# Patient Record
Sex: Female | Born: 1945 | Race: Black or African American | Hispanic: No | Smoking: Never smoker
Health system: Southern US, Community
[De-identification: ages and names within clinical notes are randomized; demographics above are authoritative.]

## PROBLEM LIST (undated history)

## (undated) DIAGNOSIS — C801 Malignant (primary) neoplasm, unspecified: Secondary | ICD-10-CM

## (undated) DIAGNOSIS — Z9889 Other specified postprocedural states: Secondary | ICD-10-CM

## (undated) DIAGNOSIS — B029 Zoster without complications: Secondary | ICD-10-CM

## (undated) DIAGNOSIS — R112 Nausea with vomiting, unspecified: Secondary | ICD-10-CM

## (undated) DIAGNOSIS — R569 Unspecified convulsions: Secondary | ICD-10-CM

## (undated) DIAGNOSIS — H35039 Hypertensive retinopathy, unspecified eye: Secondary | ICD-10-CM

## (undated) DIAGNOSIS — M1990 Unspecified osteoarthritis, unspecified site: Secondary | ICD-10-CM

## (undated) DIAGNOSIS — D649 Anemia, unspecified: Secondary | ICD-10-CM

## (undated) DIAGNOSIS — H269 Unspecified cataract: Secondary | ICD-10-CM

## (undated) DIAGNOSIS — I10 Essential (primary) hypertension: Secondary | ICD-10-CM

## (undated) DIAGNOSIS — I82409 Acute embolism and thrombosis of unspecified deep veins of unspecified lower extremity: Secondary | ICD-10-CM

## (undated) DIAGNOSIS — C189 Malignant neoplasm of colon, unspecified: Secondary | ICD-10-CM

## (undated) DIAGNOSIS — D689 Coagulation defect, unspecified: Secondary | ICD-10-CM

## (undated) DIAGNOSIS — G971 Other reaction to spinal and lumbar puncture: Secondary | ICD-10-CM

## (undated) HISTORY — PX: COLONOSCOPY: SHX5424

## (undated) HISTORY — PX: PORTACATH PLACEMENT: SHX2246

## (undated) HISTORY — PX: EYE SURGERY: SHX253

## (undated) HISTORY — DX: Hypertensive retinopathy, unspecified eye: H35.039

## (undated) HISTORY — PX: OTHER SURGICAL HISTORY: SHX169

## (undated) HISTORY — DX: Anemia, unspecified: D64.9

## (undated) HISTORY — DX: Unspecified convulsions: R56.9

## (undated) HISTORY — PX: COLON SURGERY: SHX602

## (undated) HISTORY — PX: SMALL INTESTINE SURGERY: SHX150

## (undated) HISTORY — DX: Unspecified osteoarthritis, unspecified site: M19.90

## (undated) HISTORY — DX: Malignant neoplasm of colon, unspecified: C18.9

## (undated) HISTORY — DX: Acute embolism and thrombosis of unspecified deep veins of unspecified lower extremity: I82.409

## (undated) HISTORY — PX: RADIOFREQUENCY ABLATION LIVER TUMOR: SHX2293

## (undated) HISTORY — DX: Coagulation defect, unspecified: D68.9

## (undated) HISTORY — PX: CATARACT EXTRACTION: SUR2

## (undated) HISTORY — DX: Unspecified cataract: H26.9

---

## 1997-08-08 ENCOUNTER — Encounter: Admission: RE | Admit: 1997-08-08 | Discharge: 1997-11-06 | Payer: Self-pay | Admitting: Pulmonary Disease

## 1997-11-29 ENCOUNTER — Encounter: Admission: RE | Admit: 1997-11-29 | Discharge: 1998-02-27 | Payer: Self-pay | Admitting: Pulmonary Disease

## 1999-10-24 ENCOUNTER — Emergency Department (HOSPITAL_COMMUNITY): Admission: EM | Admit: 1999-10-24 | Discharge: 1999-10-24 | Payer: Self-pay | Admitting: Emergency Medicine

## 1999-10-29 ENCOUNTER — Ambulatory Visit (HOSPITAL_COMMUNITY): Admission: RE | Admit: 1999-10-29 | Discharge: 1999-10-29 | Payer: Self-pay | Admitting: Internal Medicine

## 1999-10-29 ENCOUNTER — Encounter: Payer: Self-pay | Admitting: Internal Medicine

## 1999-11-06 ENCOUNTER — Encounter: Payer: Self-pay | Admitting: Internal Medicine

## 1999-11-06 ENCOUNTER — Ambulatory Visit (HOSPITAL_COMMUNITY): Admission: RE | Admit: 1999-11-06 | Discharge: 1999-11-06 | Payer: Self-pay | Admitting: Internal Medicine

## 1999-11-09 ENCOUNTER — Encounter: Payer: Self-pay | Admitting: Internal Medicine

## 1999-11-09 ENCOUNTER — Ambulatory Visit (HOSPITAL_COMMUNITY): Admission: RE | Admit: 1999-11-09 | Discharge: 1999-11-09 | Payer: Self-pay | Admitting: Internal Medicine

## 2002-04-06 ENCOUNTER — Ambulatory Visit (HOSPITAL_COMMUNITY): Admission: RE | Admit: 2002-04-06 | Discharge: 2002-04-06 | Payer: Self-pay | Admitting: Pulmonary Disease

## 2002-04-06 ENCOUNTER — Encounter: Payer: Self-pay | Admitting: Pulmonary Disease

## 2005-07-19 ENCOUNTER — Emergency Department (HOSPITAL_COMMUNITY): Admission: EM | Admit: 2005-07-19 | Discharge: 2005-07-19 | Payer: Self-pay | Admitting: Emergency Medicine

## 2009-02-28 ENCOUNTER — Encounter: Admission: RE | Admit: 2009-02-28 | Discharge: 2009-02-28 | Payer: Self-pay | Admitting: Gastroenterology

## 2009-03-17 ENCOUNTER — Ambulatory Visit (HOSPITAL_COMMUNITY): Admission: RE | Admit: 2009-03-17 | Discharge: 2009-03-17 | Payer: Self-pay | Admitting: General Surgery

## 2009-05-27 ENCOUNTER — Ambulatory Visit: Payer: Self-pay | Admitting: Hematology and Oncology

## 2009-05-30 LAB — CBC WITH DIFFERENTIAL/PLATELET
BASO%: 0.7 % (ref 0.0–2.0)
Eosinophils Absolute: 0 10*3/uL (ref 0.0–0.5)
MCHC: 32.6 g/dL (ref 31.5–36.0)
MONO#: 0.2 10*3/uL (ref 0.1–0.9)
NEUT#: 1.2 10*3/uL — ABNORMAL LOW (ref 1.5–6.5)
Platelets: 181 10*3/uL (ref 145–400)
RBC: 3.94 10*6/uL (ref 3.70–5.45)
RDW: 14.4 % (ref 11.2–14.5)
WBC: 2 10*3/uL — ABNORMAL LOW (ref 3.9–10.3)
lymph#: 0.5 10*3/uL — ABNORMAL LOW (ref 0.9–3.3)

## 2009-05-30 LAB — COMPREHENSIVE METABOLIC PANEL
ALT: 13 U/L (ref 0–35)
Albumin: 3.7 g/dL (ref 3.5–5.2)
CO2: 27 mEq/L (ref 19–32)
Glucose, Bld: 105 mg/dL — ABNORMAL HIGH (ref 70–99)
Potassium: 3.5 mEq/L (ref 3.5–5.3)
Sodium: 141 mEq/L (ref 135–145)
Total Bilirubin: 0.3 mg/dL (ref 0.3–1.2)
Total Protein: 7 g/dL (ref 6.0–8.3)

## 2009-05-30 LAB — CEA: CEA: <0.5 ng/mL (ref 0.0–5.0)

## 2009-06-06 ENCOUNTER — Ambulatory Visit (HOSPITAL_COMMUNITY): Admission: RE | Admit: 2009-06-06 | Discharge: 2009-06-06 | Payer: Self-pay | Admitting: Hematology and Oncology

## 2009-06-09 ENCOUNTER — Ambulatory Visit (HOSPITAL_COMMUNITY): Admission: RE | Admit: 2009-06-09 | Discharge: 2009-06-09 | Payer: Self-pay | Admitting: Hematology and Oncology

## 2009-06-10 LAB — COMPREHENSIVE METABOLIC PANEL
ALT: 12 U/L (ref 0–35)
AST: 17 U/L (ref 0–37)
Chloride: 106 mEq/L (ref 96–112)
Creatinine, Ser: 0.54 mg/dL (ref 0.40–1.20)
Total Bilirubin: 0.3 mg/dL (ref 0.3–1.2)

## 2009-06-10 LAB — CBC WITH DIFFERENTIAL/PLATELET
Basophils Absolute: 0 10*3/uL (ref 0.0–0.1)
EOS%: 0.5 % (ref 0.0–7.0)
HCT: 32.1 % — ABNORMAL LOW (ref 34.8–46.6)
HGB: 10.7 g/dL — ABNORMAL LOW (ref 11.6–15.9)
LYMPH%: 20.5 % (ref 14.0–49.7)
MCH: 29.6 pg (ref 25.1–34.0)
MCV: 88.9 fL (ref 79.5–101.0)
MONO%: 11.5 % (ref 0.0–14.0)
NEUT%: 67 % (ref 38.4–76.8)
Platelets: 215 10*3/uL (ref 145–400)
lymph#: 0.6 10*3/uL — ABNORMAL LOW (ref 0.9–3.3)

## 2009-06-11 LAB — CBC WITH DIFFERENTIAL/PLATELET
Basophils Absolute: 0 10*3/uL (ref 0.0–0.1)
EOS%: 0.6 % (ref 0.0–7.0)
HCT: 32.6 % — ABNORMAL LOW (ref 34.8–46.6)
HGB: 10.5 g/dL — ABNORMAL LOW (ref 11.6–15.9)
MCH: 29.2 pg (ref 25.1–34.0)
MCV: 90.8 fL (ref 79.5–101.0)
MONO%: 11.7 % (ref 0.0–14.0)
NEUT%: 73.2 % (ref 38.4–76.8)
Platelets: 211 10*3/uL (ref 145–400)

## 2009-06-11 LAB — COMPREHENSIVE METABOLIC PANEL
Albumin: 3.3 g/dL — ABNORMAL LOW (ref 3.5–5.2)
Alkaline Phosphatase: 73 U/L (ref 39–117)
BUN: 10 mg/dL (ref 6–23)
Glucose, Bld: 92 mg/dL (ref 70–99)
Total Bilirubin: 0.3 mg/dL (ref 0.3–1.2)

## 2009-06-16 ENCOUNTER — Emergency Department (HOSPITAL_COMMUNITY): Admission: EM | Admit: 2009-06-16 | Discharge: 2009-06-16 | Payer: Self-pay | Admitting: Family Medicine

## 2009-06-16 ENCOUNTER — Emergency Department (HOSPITAL_COMMUNITY): Admission: EM | Admit: 2009-06-16 | Discharge: 2009-06-16 | Payer: Self-pay | Admitting: Emergency Medicine

## 2009-06-20 ENCOUNTER — Ambulatory Visit: Payer: Self-pay | Admitting: Hematology and Oncology

## 2009-06-24 LAB — COMPREHENSIVE METABOLIC PANEL
Albumin: 3.2 g/dL — ABNORMAL LOW (ref 3.5–5.2)
Alkaline Phosphatase: 90 U/L (ref 39–117)
BUN: 11 mg/dL (ref 6–23)
CO2: 27 mEq/L (ref 19–32)
Calcium: 8.6 mg/dL (ref 8.4–10.5)
Glucose, Bld: 100 mg/dL — ABNORMAL HIGH (ref 70–99)
Potassium: 4 mEq/L (ref 3.5–5.3)
Total Protein: 5.7 g/dL — ABNORMAL LOW (ref 6.0–8.3)

## 2009-06-24 LAB — CBC WITH DIFFERENTIAL/PLATELET
Basophils Absolute: 0 10*3/uL (ref 0.0–0.1)
Eosinophils Absolute: 0.1 10*3/uL (ref 0.0–0.5)
HGB: 10.4 g/dL — ABNORMAL LOW (ref 11.6–15.9)
MCV: 89.1 fL (ref 79.5–101.0)
MONO#: 0.8 10*3/uL (ref 0.1–0.9)
MONO%: 9.6 % (ref 0.0–14.0)
NEUT#: 6.1 10*3/uL (ref 1.5–6.5)
Platelets: 134 10*3/uL — ABNORMAL LOW (ref 145–400)
RDW: 14.9 % — ABNORMAL HIGH (ref 11.2–14.5)
WBC: 7.9 10*3/uL (ref 3.9–10.3)

## 2009-06-25 LAB — UA PROTEIN, DIPSTICK - CHCC: Protein, Urine: NEGATIVE mg/dL

## 2009-07-08 LAB — COMPREHENSIVE METABOLIC PANEL
ALT: 15 U/L (ref 0–35)
Albumin: 3.1 g/dL — ABNORMAL LOW (ref 3.5–5.2)
CO2: 26 mEq/L (ref 19–32)
Calcium: 7.7 mg/dL — ABNORMAL LOW (ref 8.4–10.5)
Chloride: 107 mEq/L (ref 96–112)
Glucose, Bld: 105 mg/dL — ABNORMAL HIGH (ref 70–99)
Sodium: 142 mEq/L (ref 135–145)
Total Bilirubin: 0.3 mg/dL (ref 0.3–1.2)
Total Protein: 5.4 g/dL — ABNORMAL LOW (ref 6.0–8.3)

## 2009-07-08 LAB — CBC WITH DIFFERENTIAL/PLATELET
Basophils Absolute: 0.1 10*3/uL (ref 0.0–0.1)
Eosinophils Absolute: 0.1 10*3/uL (ref 0.0–0.5)
HGB: 10.5 g/dL — ABNORMAL LOW (ref 11.6–15.9)
MCV: 88 fL (ref 79.5–101.0)
MONO#: 0.9 10*3/uL (ref 0.1–0.9)
MONO%: 12.9 % (ref 0.0–14.0)
NEUT#: 4.9 10*3/uL (ref 1.5–6.5)
Platelets: 116 10*3/uL — ABNORMAL LOW (ref 145–400)
RDW: 16.5 % — ABNORMAL HIGH (ref 11.2–14.5)
WBC: 6.8 10*3/uL (ref 3.9–10.3)
nRBC: 1 % — ABNORMAL HIGH (ref 0–0)

## 2009-07-09 LAB — UA PROTEIN, DIPSTICK - CHCC: Protein, Urine: NEGATIVE mg/dL

## 2009-07-21 ENCOUNTER — Ambulatory Visit: Payer: Self-pay | Admitting: Hematology and Oncology

## 2009-07-22 LAB — COMPREHENSIVE METABOLIC PANEL
ALT: 16 U/L (ref 0–35)
AST: 18 U/L (ref 0–37)
CO2: 22 mEq/L (ref 19–32)
Calcium: 7.9 mg/dL — ABNORMAL LOW (ref 8.4–10.5)
Chloride: 108 mEq/L (ref 96–112)
Creatinine, Ser: 0.69 mg/dL (ref 0.40–1.20)
Sodium: 140 mEq/L (ref 135–145)
Total Protein: 5.1 g/dL — ABNORMAL LOW (ref 6.0–8.3)

## 2009-07-22 LAB — CBC WITH DIFFERENTIAL/PLATELET
BASO%: 0.2 % (ref 0.0–2.0)
EOS%: 1.3 % (ref 0.0–7.0)
MCH: 28.8 pg (ref 25.1–34.0)
MCHC: 32.8 g/dL (ref 31.5–36.0)
MONO#: 1 10*3/uL — ABNORMAL HIGH (ref 0.1–0.9)
NEUT%: 74.2 % (ref 38.4–76.8)
RBC: 3.35 10*6/uL — ABNORMAL LOW (ref 3.70–5.45)
RDW: 17.6 % — ABNORMAL HIGH (ref 11.2–14.5)
WBC: 7.2 10*3/uL (ref 3.9–10.3)
lymph#: 0.8 10*3/uL — ABNORMAL LOW (ref 0.9–3.3)

## 2009-07-23 LAB — UA PROTEIN, DIPSTICK - CHCC: Protein, Urine: NEGATIVE mg/dL

## 2009-07-31 LAB — COMPREHENSIVE METABOLIC PANEL
ALT: 13 U/L (ref 0–35)
AST: 14 U/L (ref 0–37)
Alkaline Phosphatase: 96 U/L (ref 39–117)
BUN: 12 mg/dL (ref 6–23)
Creatinine, Ser: 0.66 mg/dL (ref 0.40–1.20)

## 2009-07-31 LAB — CBC WITH DIFFERENTIAL/PLATELET
BASO%: 0.1 % (ref 0.0–2.0)
Basophils Absolute: 0 10*3/uL (ref 0.0–0.1)
EOS%: 2.1 % (ref 0.0–7.0)
HCT: 27.9 % — ABNORMAL LOW (ref 34.8–46.6)
HGB: 9.3 g/dL — ABNORMAL LOW (ref 11.6–15.9)
MCH: 28.9 pg (ref 25.1–34.0)
MCHC: 33.3 g/dL (ref 31.5–36.0)
MCV: 86.6 fL (ref 79.5–101.0)
MONO%: 6.5 % (ref 0.0–14.0)
NEUT%: 85.1 % — ABNORMAL HIGH (ref 38.4–76.8)
RDW: 17.7 % — ABNORMAL HIGH (ref 11.2–14.5)

## 2009-08-06 LAB — URINALYSIS, MICROSCOPIC - CHCC
Blood: NEGATIVE
Nitrite: NEGATIVE
Protein: NEGATIVE mg/dL
Specific Gravity, Urine: 1.01 (ref 1.003–1.035)
pH: 6.5 (ref 4.6–8.0)

## 2009-08-06 LAB — CBC WITH DIFFERENTIAL/PLATELET
Basophils Absolute: 0 10*3/uL (ref 0.0–0.1)
Eosinophils Absolute: 0.1 10*3/uL (ref 0.0–0.5)
HCT: 30.1 % — ABNORMAL LOW (ref 34.8–46.6)
HGB: 9.8 g/dL — ABNORMAL LOW (ref 11.6–15.9)
LYMPH%: 12.2 % — ABNORMAL LOW (ref 14.0–49.7)
MCHC: 32.4 g/dL (ref 31.5–36.0)
MONO#: 0.7 10*3/uL (ref 0.1–0.9)
NEUT#: 3.7 10*3/uL (ref 1.5–6.5)
NEUT%: 72.2 % (ref 38.4–76.8)
Platelets: 127 10*3/uL — ABNORMAL LOW (ref 145–400)
WBC: 5.1 10*3/uL (ref 3.9–10.3)

## 2009-08-19 LAB — CBC WITH DIFFERENTIAL/PLATELET
Basophils Absolute: 0.1 10*3/uL (ref 0.0–0.1)
EOS%: 0.5 % (ref 0.0–7.0)
Eosinophils Absolute: 0 10*3/uL (ref 0.0–0.5)
HCT: 30.7 % — ABNORMAL LOW (ref 34.8–46.6)
HGB: 9.6 g/dL — ABNORMAL LOW (ref 11.6–15.9)
MCH: 26.6 pg (ref 25.1–34.0)
MCV: 85 fL (ref 79.5–101.0)
MONO%: 15.2 % — ABNORMAL HIGH (ref 0.0–14.0)
NEUT#: 4 10*3/uL (ref 1.5–6.5)
NEUT%: 68.3 % (ref 38.4–76.8)
lymph#: 0.9 10*3/uL (ref 0.9–3.3)

## 2009-08-19 LAB — COMPREHENSIVE METABOLIC PANEL
AST: 23 U/L (ref 0–37)
Albumin: 3.4 g/dL — ABNORMAL LOW (ref 3.5–5.2)
BUN: 8 mg/dL (ref 6–23)
Calcium: 8.8 mg/dL (ref 8.4–10.5)
Chloride: 110 mEq/L (ref 96–112)
Creatinine, Ser: 0.69 mg/dL (ref 0.40–1.20)
Glucose, Bld: 83 mg/dL (ref 70–99)

## 2009-08-20 ENCOUNTER — Ambulatory Visit: Payer: Self-pay | Admitting: Hematology and Oncology

## 2009-08-27 ENCOUNTER — Ambulatory Visit (HOSPITAL_COMMUNITY): Admission: RE | Admit: 2009-08-27 | Discharge: 2009-08-27 | Payer: Self-pay | Admitting: Hematology and Oncology

## 2009-08-28 ENCOUNTER — Encounter: Payer: Self-pay | Admitting: Hematology and Oncology

## 2009-08-28 ENCOUNTER — Ambulatory Visit: Payer: Self-pay | Admitting: Vascular Surgery

## 2009-08-28 ENCOUNTER — Ambulatory Visit: Admission: RE | Admit: 2009-08-28 | Discharge: 2009-08-28 | Payer: Self-pay | Admitting: Hematology and Oncology

## 2009-08-28 LAB — CBC WITH DIFFERENTIAL/PLATELET
Basophils Absolute: 0 10*3/uL (ref 0.0–0.1)
EOS%: 0.8 % (ref 0.0–7.0)
Eosinophils Absolute: 0.1 10*3/uL (ref 0.0–0.5)
HCT: 26.2 % — ABNORMAL LOW (ref 34.8–46.6)
HGB: 8.6 g/dL — ABNORMAL LOW (ref 11.6–15.9)
MCH: 27.7 pg (ref 25.1–34.0)
MONO#: 0.8 10*3/uL (ref 0.1–0.9)
NEUT#: 8.8 10*3/uL — ABNORMAL HIGH (ref 1.5–6.5)
NEUT%: 85.1 % — ABNORMAL HIGH (ref 38.4–76.8)
RDW: 20.9 % — ABNORMAL HIGH (ref 11.2–14.5)
lymph#: 0.6 10*3/uL — ABNORMAL LOW (ref 0.9–3.3)

## 2009-09-02 LAB — COMPREHENSIVE METABOLIC PANEL
Albumin: 3.5 g/dL (ref 3.5–5.2)
Alkaline Phosphatase: 97 U/L (ref 39–117)
BUN: 10 mg/dL (ref 6–23)
CO2: 23 mEq/L (ref 19–32)
Glucose, Bld: 98 mg/dL (ref 70–99)
Total Bilirubin: 0.3 mg/dL (ref 0.3–1.2)

## 2009-09-02 LAB — CBC WITH DIFFERENTIAL/PLATELET
Basophils Absolute: 0.1 10*3/uL (ref 0.0–0.1)
Eosinophils Absolute: 0 10*3/uL (ref 0.0–0.5)
HCT: 29.8 % — ABNORMAL LOW (ref 34.8–46.6)
HGB: 9.2 g/dL — ABNORMAL LOW (ref 11.6–15.9)
LYMPH%: 9.3 % — ABNORMAL LOW (ref 14.0–49.7)
MCV: 85.4 fL (ref 79.5–101.0)
MONO%: 17.3 % — ABNORMAL HIGH (ref 0.0–14.0)
NEUT#: 5.2 10*3/uL (ref 1.5–6.5)
Platelets: 91 10*3/uL — ABNORMAL LOW (ref 145–400)

## 2009-09-02 LAB — PROTIME-INR

## 2009-09-03 LAB — HYPERCOAGULABLE PANEL, COMPREHENSIVE
Anticardiolipin IgA: 6 APL U/mL (ref <22)
Anticardiolipin IgG: 5 GPL U/mL (ref <23)
Anticardiolipin IgM: 6 MPL U/mL (ref <11)
Beta-2 Glyco I IgG: 3 G Units (ref <20)
DRVVT: 34.1 secs — ABNORMAL LOW (ref 36.2–44.3)
PTT Lupus Anticoagulant: 35.5 secs (ref 32.0–43.4)
Protein C Activity: 67 % — ABNORMAL LOW (ref 75–133)
Protein C, Total: 54 % — ABNORMAL LOW (ref 70–140)

## 2009-09-05 LAB — CBC WITH DIFFERENTIAL/PLATELET
Basophils Absolute: 0 10*3/uL (ref 0.0–0.1)
Eosinophils Absolute: 0.1 10*3/uL (ref 0.0–0.5)
HCT: 28.5 % — ABNORMAL LOW (ref 34.8–46.6)
LYMPH%: 9.5 % — ABNORMAL LOW (ref 14.0–49.7)
MONO#: 0.5 10*3/uL (ref 0.1–0.9)
NEUT#: 7.1 10*3/uL — ABNORMAL HIGH (ref 1.5–6.5)
NEUT%: 82.8 % — ABNORMAL HIGH (ref 38.4–76.8)
Platelets: 129 10*3/uL — ABNORMAL LOW (ref 145–400)
WBC: 8.5 10*3/uL (ref 3.9–10.3)

## 2009-09-09 LAB — PROTIME-INR
INR: 1.5 — ABNORMAL LOW (ref 2.00–3.50)
Protime: 18 Seconds — ABNORMAL HIGH (ref 10.6–13.4)

## 2009-09-16 LAB — CBC WITH DIFFERENTIAL/PLATELET
Eosinophils Absolute: 0 10*3/uL (ref 0.0–0.5)
MCV: 86.6 fL (ref 79.5–101.0)
MONO#: 0.4 10*3/uL (ref 0.1–0.9)
MONO%: 17 % — ABNORMAL HIGH (ref 0.0–14.0)
NEUT#: 1.1 10*3/uL — ABNORMAL LOW (ref 1.5–6.5)
RBC: 3.52 10*6/uL — ABNORMAL LOW (ref 3.70–5.45)
RDW: 22.1 % — ABNORMAL HIGH (ref 11.2–14.5)
WBC: 2.5 10*3/uL — ABNORMAL LOW (ref 3.9–10.3)
nRBC: 0 % (ref 0–0)

## 2009-09-16 LAB — COMPREHENSIVE METABOLIC PANEL
ALT: 14 U/L (ref 0–35)
Alkaline Phosphatase: 86 U/L (ref 39–117)
CO2: 24 mEq/L (ref 19–32)
Sodium: 144 mEq/L (ref 135–145)
Total Bilirubin: 0.3 mg/dL (ref 0.3–1.2)
Total Protein: 5.8 g/dL — ABNORMAL LOW (ref 6.0–8.3)

## 2009-09-16 LAB — PROTIME-INR: Protime: 14.4 Seconds — ABNORMAL HIGH (ref 10.6–13.4)

## 2009-09-17 LAB — UA PROTEIN, DIPSTICK - CHCC: Protein, Urine: NEGATIVE mg/dL

## 2009-09-19 ENCOUNTER — Ambulatory Visit: Payer: Self-pay | Admitting: Hematology and Oncology

## 2009-09-19 LAB — PROTIME-INR
INR: 1.4 — ABNORMAL LOW (ref 2.00–3.50)
Protime: 16.8 Seconds — ABNORMAL HIGH (ref 10.6–13.4)

## 2009-09-22 LAB — PROTIME-INR
INR: 1.1 — ABNORMAL LOW (ref 2.00–3.50)
Protime: 13.2 Seconds (ref 10.6–13.4)

## 2009-09-30 LAB — CBC WITH DIFFERENTIAL/PLATELET
BASO%: 0.6 % (ref 0.0–2.0)
LYMPH%: 25.7 % (ref 14.0–49.7)
MCHC: 31.8 g/dL (ref 31.5–36.0)
MONO#: 0.2 10*3/uL (ref 0.1–0.9)
Platelets: 113 10*3/uL — ABNORMAL LOW (ref 145–400)
RBC: 3.49 10*6/uL — ABNORMAL LOW (ref 3.70–5.45)
WBC: 2.1 10*3/uL — ABNORMAL LOW (ref 3.9–10.3)
lymph#: 0.5 10*3/uL — ABNORMAL LOW (ref 0.9–3.3)

## 2009-09-30 LAB — COMPREHENSIVE METABOLIC PANEL
Alkaline Phosphatase: 77 U/L (ref 39–117)
Glucose, Bld: 134 mg/dL — ABNORMAL HIGH (ref 70–99)
Sodium: 141 mEq/L (ref 135–145)
Total Bilirubin: 0.3 mg/dL (ref 0.3–1.2)
Total Protein: 6.1 g/dL (ref 6.0–8.3)

## 2009-09-30 LAB — PROTIME-INR: Protime: 15.6 Seconds — ABNORMAL HIGH (ref 10.6–13.4)

## 2009-10-06 ENCOUNTER — Emergency Department (HOSPITAL_COMMUNITY): Admission: EM | Admit: 2009-10-06 | Discharge: 2009-10-06 | Payer: Self-pay | Admitting: Emergency Medicine

## 2009-10-07 LAB — CBC WITH DIFFERENTIAL/PLATELET
Basophils Absolute: 0 10*3/uL (ref 0.0–0.1)
EOS%: 0.9 % (ref 0.0–7.0)
HGB: 9.4 g/dL — ABNORMAL LOW (ref 11.6–15.9)
MCH: 27.6 pg (ref 25.1–34.0)
MCV: 85.9 fL (ref 79.5–101.0)
MONO%: 2.5 % (ref 0.0–14.0)
RBC: 3.42 10*6/uL — ABNORMAL LOW (ref 3.70–5.45)
RDW: 22 % — ABNORMAL HIGH (ref 11.2–14.5)

## 2009-10-07 LAB — PROTIME-INR

## 2009-10-10 LAB — PROTIME-INR: INR: 1.6 — ABNORMAL LOW (ref 2.00–3.50)

## 2009-10-14 LAB — COMPREHENSIVE METABOLIC PANEL
BUN: 11 mg/dL (ref 6–23)
CO2: 26 mEq/L (ref 19–32)
Calcium: 8.7 mg/dL (ref 8.4–10.5)
Chloride: 105 mEq/L (ref 96–112)
Creatinine, Ser: 0.64 mg/dL (ref 0.40–1.20)
Glucose, Bld: 94 mg/dL (ref 70–99)

## 2009-10-14 LAB — CBC WITH DIFFERENTIAL/PLATELET
Basophils Absolute: 0 10*3/uL (ref 0.0–0.1)
EOS%: 1.1 % (ref 0.0–7.0)
Eosinophils Absolute: 0 10*3/uL (ref 0.0–0.5)
HCT: 30.9 % — ABNORMAL LOW (ref 34.8–46.6)
HGB: 9.6 g/dL — ABNORMAL LOW (ref 11.6–15.9)
MCH: 26.9 pg (ref 25.1–34.0)
MONO#: 0.5 10*3/uL (ref 0.1–0.9)
NEUT%: 65.2 % (ref 38.4–76.8)
lymph#: 0.7 10*3/uL — ABNORMAL LOW (ref 0.9–3.3)

## 2009-10-14 LAB — PROTIME-INR

## 2009-10-15 LAB — UA PROTEIN, DIPSTICK - CHCC: Protein, Urine: NEGATIVE mg/dL

## 2009-10-17 LAB — PROTIME-INR
INR: 2.7 (ref 2.00–3.50)
Protime: 32.4 Seconds — ABNORMAL HIGH (ref 10.6–13.4)

## 2009-10-20 ENCOUNTER — Ambulatory Visit: Payer: Self-pay | Admitting: Hematology and Oncology

## 2009-10-28 LAB — CBC WITH DIFFERENTIAL/PLATELET
BASO%: 0.3 % (ref 0.0–2.0)
Basophils Absolute: 0 10*3/uL (ref 0.0–0.1)
EOS%: 0.7 % (ref 0.0–7.0)
HCT: 31 % — ABNORMAL LOW (ref 34.8–46.6)
LYMPH%: 9.2 % — ABNORMAL LOW (ref 14.0–49.7)
MCH: 27.6 pg (ref 25.1–34.0)
MCHC: 32.2 g/dL (ref 31.5–36.0)
NEUT%: 85.1 % — ABNORMAL HIGH (ref 38.4–76.8)
Platelets: 114 10*3/uL — ABNORMAL LOW (ref 145–400)

## 2009-10-28 LAB — BASIC METABOLIC PANEL
CO2: 19 mEq/L (ref 19–32)
Calcium: 9 mg/dL (ref 8.4–10.5)
Chloride: 106 mEq/L (ref 96–112)
Glucose, Bld: 125 mg/dL — ABNORMAL HIGH (ref 70–99)
Sodium: 141 mEq/L (ref 135–145)

## 2009-11-03 LAB — PROTIME-INR: INR: 3.6 — ABNORMAL HIGH (ref 2.00–3.50)

## 2009-11-04 ENCOUNTER — Ambulatory Visit (HOSPITAL_COMMUNITY): Admission: RE | Admit: 2009-11-04 | Discharge: 2009-11-04 | Payer: Self-pay | Admitting: Pulmonary Disease

## 2009-11-07 LAB — PROTIME-INR
INR: 2.7 (ref 2.00–3.50)
Protime: 32.4 Seconds — ABNORMAL HIGH (ref 10.6–13.4)

## 2009-11-11 LAB — CBC WITH DIFFERENTIAL/PLATELET
BASO%: 0.7 % (ref 0.0–2.0)
Basophils Absolute: 0.1 10*3/uL (ref 0.0–0.1)
EOS%: 0.9 % (ref 0.0–7.0)
HGB: 9.8 g/dL — ABNORMAL LOW (ref 11.6–15.9)
MCH: 26.9 pg (ref 25.1–34.0)
MCHC: 31.1 g/dL — ABNORMAL LOW (ref 31.5–36.0)
RBC: 3.64 10*6/uL — ABNORMAL LOW (ref 3.70–5.45)
RDW: 21.5 % — ABNORMAL HIGH (ref 11.2–14.5)
lymph#: 1.1 10*3/uL (ref 0.9–3.3)
nRBC: 1 % — ABNORMAL HIGH (ref 0–0)

## 2009-11-11 LAB — COMPREHENSIVE METABOLIC PANEL
ALT: 22 U/L (ref 0–35)
AST: 27 U/L (ref 0–37)
Albumin: 3.6 g/dL (ref 3.5–5.2)
Alkaline Phosphatase: 118 U/L — ABNORMAL HIGH (ref 39–117)
Potassium: 3.8 mEq/L (ref 3.5–5.3)
Sodium: 143 mEq/L (ref 135–145)
Total Bilirubin: 0.3 mg/dL (ref 0.3–1.2)
Total Protein: 5.8 g/dL — ABNORMAL LOW (ref 6.0–8.3)

## 2009-11-11 LAB — UA PROTEIN, DIPSTICK - CHCC: Protein, Urine: NEGATIVE mg/dL

## 2009-11-19 ENCOUNTER — Ambulatory Visit: Payer: Self-pay | Admitting: Hematology and Oncology

## 2009-11-21 LAB — COMPREHENSIVE METABOLIC PANEL
ALT: 15 U/L (ref 0–35)
BUN: 11 mg/dL (ref 6–23)
CO2: 24 mEq/L (ref 19–32)
Calcium: 8 mg/dL — ABNORMAL LOW (ref 8.4–10.5)
Chloride: 110 mEq/L (ref 96–112)
Creatinine, Ser: 0.65 mg/dL (ref 0.40–1.20)
Total Bilirubin: 0.3 mg/dL (ref 0.3–1.2)

## 2009-11-21 LAB — CBC WITH DIFFERENTIAL/PLATELET
BASO%: 0.1 % (ref 0.0–2.0)
Basophils Absolute: 0 10*3/uL (ref 0.0–0.1)
HCT: 27 % — ABNORMAL LOW (ref 34.8–46.6)
HGB: 9 g/dL — ABNORMAL LOW (ref 11.6–15.9)
LYMPH%: 6.8 % — ABNORMAL LOW (ref 14.0–49.7)
MCH: 28.7 pg (ref 25.1–34.0)
MCHC: 33.2 g/dL (ref 31.5–36.0)
MONO#: 1.5 10*3/uL — ABNORMAL HIGH (ref 0.1–0.9)
NEUT%: 79.1 % — ABNORMAL HIGH (ref 38.4–76.8)
Platelets: 124 10*3/uL — ABNORMAL LOW (ref 145–400)
WBC: 11.7 10*3/uL — ABNORMAL HIGH (ref 3.9–10.3)
lymph#: 0.8 10*3/uL — ABNORMAL LOW (ref 0.9–3.3)

## 2009-11-26 ENCOUNTER — Emergency Department (HOSPITAL_COMMUNITY): Admission: EM | Admit: 2009-11-26 | Discharge: 2009-11-26 | Payer: Self-pay | Admitting: Emergency Medicine

## 2009-11-26 LAB — CBC WITH DIFFERENTIAL/PLATELET
Basophils Absolute: 0.1 10*3/uL (ref 0.0–0.1)
EOS%: 1.9 % (ref 0.0–7.0)
Eosinophils Absolute: 0.2 10*3/uL (ref 0.0–0.5)
HCT: 33 % — ABNORMAL LOW (ref 34.8–46.6)
HGB: 10.1 g/dL — ABNORMAL LOW (ref 11.6–15.9)
MONO#: 1 10*3/uL — ABNORMAL HIGH (ref 0.1–0.9)
NEUT#: 7.4 10*3/uL — ABNORMAL HIGH (ref 1.5–6.5)
NEUT%: 77.3 % — ABNORMAL HIGH (ref 38.4–76.8)
RDW: 21.7 % — ABNORMAL HIGH (ref 11.2–14.5)
WBC: 9.6 10*3/uL (ref 3.9–10.3)
lymph#: 0.9 10*3/uL (ref 0.9–3.3)

## 2009-11-26 LAB — PROTIME-INR
INR: 2.2 (ref 2.00–3.50)
Protime: 26.4 Seconds — ABNORMAL HIGH (ref 10.6–13.4)

## 2009-11-26 LAB — UA PROTEIN, DIPSTICK - CHCC: Protein, Urine: NEGATIVE mg/dL

## 2009-12-02 LAB — COMPREHENSIVE METABOLIC PANEL
ALT: 18 U/L (ref 0–35)
AST: 27 U/L (ref 0–37)
Alkaline Phosphatase: 101 U/L (ref 39–117)
BUN: 11 mg/dL (ref 6–23)
Creatinine, Ser: 0.7 mg/dL (ref 0.40–1.20)
Potassium: 3.6 mEq/L (ref 3.5–5.3)

## 2009-12-02 LAB — PROTIME-INR
INR: 2.1 (ref 2.00–3.50)
Protime: 25.2 Seconds — ABNORMAL HIGH (ref 10.6–13.4)

## 2009-12-02 LAB — CBC WITH DIFFERENTIAL/PLATELET
EOS%: 0.5 % (ref 0.0–7.0)
Eosinophils Absolute: 0 10*3/uL (ref 0.0–0.5)
HGB: 9.3 g/dL — ABNORMAL LOW (ref 11.6–15.9)
MCV: 86.6 fL (ref 79.5–101.0)
MONO%: 10.2 % (ref 0.0–14.0)
NEUT#: 3.3 10*3/uL (ref 1.5–6.5)
RBC: 3.4 10*6/uL — ABNORMAL LOW (ref 3.70–5.45)
RDW: 23.3 % — ABNORMAL HIGH (ref 11.2–14.5)
lymph#: 0.7 10*3/uL — ABNORMAL LOW (ref 0.9–3.3)

## 2009-12-16 LAB — CBC WITH DIFFERENTIAL/PLATELET
BASO%: 0.5 % (ref 0.0–2.0)
LYMPH%: 11.5 % — ABNORMAL LOW (ref 14.0–49.7)
MCHC: 32.3 g/dL (ref 31.5–36.0)
MCV: 87.1 fL (ref 79.5–101.0)
MONO%: 9.3 % (ref 0.0–14.0)
Platelets: 92 10*3/uL — ABNORMAL LOW (ref 145–400)
RBC: 3.42 10*6/uL — ABNORMAL LOW (ref 3.70–5.45)

## 2009-12-16 LAB — UA PROTEIN, DIPSTICK - CHCC: Protein, Urine: NEGATIVE mg/dL

## 2009-12-16 LAB — COMPREHENSIVE METABOLIC PANEL
Albumin: 3.5 g/dL (ref 3.5–5.2)
Alkaline Phosphatase: 104 U/L (ref 39–117)
BUN: 7 mg/dL (ref 6–23)
Glucose, Bld: 87 mg/dL (ref 70–99)
Potassium: 3.9 mEq/L (ref 3.5–5.3)
Total Bilirubin: 0.3 mg/dL (ref 0.3–1.2)

## 2009-12-16 LAB — PROTIME-INR: Protime: 20.4 Seconds — ABNORMAL HIGH (ref 10.6–13.4)

## 2009-12-19 ENCOUNTER — Ambulatory Visit: Payer: Self-pay | Admitting: Hematology and Oncology

## 2009-12-19 LAB — PROTIME-INR: Protime: 34.8 Seconds — ABNORMAL HIGH (ref 10.6–13.4)

## 2009-12-30 LAB — PROTIME-INR: Protime: 24 Seconds — ABNORMAL HIGH (ref 10.6–13.4)

## 2010-01-06 LAB — PROTIME-INR
INR: 2.2 (ref 2.00–3.50)
Protime: 26.4 Seconds — ABNORMAL HIGH (ref 10.6–13.4)

## 2010-01-12 ENCOUNTER — Ambulatory Visit (HOSPITAL_COMMUNITY): Admission: RE | Admit: 2010-01-12 | Discharge: 2010-01-12 | Payer: Self-pay | Admitting: Hematology and Oncology

## 2010-01-12 LAB — CBC WITH DIFFERENTIAL/PLATELET
Basophils Absolute: 0 10*3/uL (ref 0.0–0.1)
Eosinophils Absolute: 0 10*3/uL (ref 0.0–0.5)
LYMPH%: 12.7 % — ABNORMAL LOW (ref 14.0–49.7)
MCH: 28.3 pg (ref 25.1–34.0)
MCHC: 32.9 g/dL (ref 31.5–36.0)
MONO#: 0.4 10*3/uL (ref 0.1–0.9)
MONO%: 13.6 % (ref 0.0–14.0)
NEUT#: 2.1 10*3/uL (ref 1.5–6.5)
Platelets: 109 10*3/uL — ABNORMAL LOW (ref 145–400)
WBC: 3 10*3/uL — ABNORMAL LOW (ref 3.9–10.3)
lymph#: 0.4 10*3/uL — ABNORMAL LOW (ref 0.9–3.3)

## 2010-01-12 LAB — COMPREHENSIVE METABOLIC PANEL
ALT: 18 U/L (ref 0–35)
AST: 29 U/L (ref 0–37)
Albumin: 2.9 g/dL — ABNORMAL LOW (ref 3.5–5.2)
BUN: 8 mg/dL (ref 6–23)
Calcium: 8.2 mg/dL — ABNORMAL LOW (ref 8.4–10.5)
Creatinine, Ser: 0.65 mg/dL (ref 0.40–1.20)
Glucose, Bld: 105 mg/dL — ABNORMAL HIGH (ref 70–99)
Potassium: 3.6 mEq/L (ref 3.5–5.3)
Sodium: 141 mEq/L (ref 135–145)
Total Protein: 6.2 g/dL (ref 6.0–8.3)

## 2010-01-12 LAB — PROTIME-INR
INR: 1.9 — ABNORMAL LOW (ref 2.00–3.50)
Protime: 22.8 Seconds — ABNORMAL HIGH (ref 10.6–13.4)

## 2010-01-12 LAB — LACTATE DEHYDROGENASE: LDH: 193 U/L (ref 94–250)

## 2010-01-12 LAB — CEA: CEA: <0.5 ng/mL (ref 0.0–5.0)

## 2010-01-19 ENCOUNTER — Ambulatory Visit: Admission: RE | Admit: 2010-01-19 | Discharge: 2010-01-19 | Payer: Self-pay | Admitting: Hematology and Oncology

## 2010-01-19 ENCOUNTER — Ambulatory Visit: Payer: Self-pay | Admitting: Vascular Surgery

## 2010-01-19 ENCOUNTER — Ambulatory Visit: Payer: Self-pay | Admitting: Hematology and Oncology

## 2010-01-19 ENCOUNTER — Encounter: Payer: Self-pay | Admitting: Hematology and Oncology

## 2010-01-26 LAB — PROTIME-INR: Protime: 24 Seconds — ABNORMAL HIGH (ref 10.6–13.4)

## 2010-02-05 ENCOUNTER — Ambulatory Visit (HOSPITAL_COMMUNITY): Admission: RE | Admit: 2010-02-05 | Discharge: 2010-02-05 | Payer: Self-pay | Admitting: Obstetrics and Gynecology

## 2010-02-09 LAB — PROTIME-INR
INR: 2.2 (ref 2.00–3.50)
Protime: 26.4 Seconds — ABNORMAL HIGH (ref 10.6–13.4)

## 2010-02-18 ENCOUNTER — Ambulatory Visit: Payer: Self-pay | Admitting: Hematology and Oncology

## 2010-02-23 ENCOUNTER — Ambulatory Visit (HOSPITAL_COMMUNITY): Admission: RE | Admit: 2010-02-23 | Discharge: 2010-02-23 | Payer: Self-pay | Admitting: Obstetrics and Gynecology

## 2010-02-24 ENCOUNTER — Inpatient Hospital Stay (HOSPITAL_COMMUNITY): Admission: AD | Admit: 2010-02-24 | Discharge: 2010-02-24 | Payer: Self-pay | Admitting: Obstetrics and Gynecology

## 2010-03-06 LAB — PROTIME-INR: Protime: 12 Seconds (ref 10.6–13.4)

## 2010-03-09 LAB — PROTIME-INR: Protime: 13.2 Seconds (ref 10.6–13.4)

## 2010-03-12 LAB — PROTIME-INR: Protime: 15.6 Seconds — ABNORMAL HIGH (ref 10.6–13.4)

## 2010-03-20 ENCOUNTER — Ambulatory Visit: Payer: Self-pay | Admitting: Hematology and Oncology

## 2010-04-05 HISTORY — PX: ABDOMINAL HYSTERECTOMY: SHX81

## 2010-04-20 ENCOUNTER — Ambulatory Visit (HOSPITAL_COMMUNITY): Admission: RE | Admit: 2010-04-20 | Payer: Self-pay | Source: Home / Self Care | Admitting: Hematology and Oncology

## 2010-04-20 ENCOUNTER — Ambulatory Visit: Payer: Self-pay | Admitting: Hematology and Oncology

## 2010-04-20 LAB — CBC WITH DIFFERENTIAL/PLATELET
BASO%: 0.1 % (ref 0.0–2.0)
Basophils Absolute: 0 10*3/uL (ref 0.0–0.1)
EOS%: 0.8 % (ref 0.0–7.0)
Eosinophils Absolute: 0 10*3/uL (ref 0.0–0.5)
HCT: 25.9 % — ABNORMAL LOW (ref 34.8–46.6)
HGB: 8.5 g/dL — ABNORMAL LOW (ref 11.6–15.9)
LYMPH%: 15.7 % (ref 14.0–49.7)
MCH: 27.7 pg (ref 25.1–34.0)
MCHC: 32.7 g/dL (ref 31.5–36.0)
MCV: 84.7 fL (ref 79.5–101.0)
MONO#: 0.2 10*3/uL (ref 0.1–0.9)
MONO%: 5.7 % (ref 0.0–14.0)
NEUT#: 2.1 10*3/uL (ref 1.5–6.5)
NEUT%: 77.7 % — ABNORMAL HIGH (ref 38.4–76.8)
Platelets: 131 10*3/uL — ABNORMAL LOW (ref 145–400)
RBC: 3.06 10*6/uL — ABNORMAL LOW (ref 3.70–5.45)
RDW: 17.9 % — ABNORMAL HIGH (ref 11.2–14.5)
WBC: 2.7 10*3/uL — ABNORMAL LOW (ref 3.9–10.3)
lymph#: 0.4 10*3/uL — ABNORMAL LOW (ref 0.9–3.3)

## 2010-04-20 LAB — CMP (CANCER CENTER ONLY)
ALT(SGPT): 24 U/L (ref 10–47)
AST: 31 U/L (ref 11–38)
Albumin: 2.8 g/dL — ABNORMAL LOW (ref 3.3–5.5)
Alkaline Phosphatase: 64 U/L (ref 26–84)
BUN, Bld: 12 mg/dL (ref 7–22)
CO2: 26 mEq/L (ref 18–33)
Calcium: 8.3 mg/dL (ref 8.0–10.3)
Chloride: 105 mEq/L (ref 98–108)
Creat: 0.8 mg/dl (ref 0.6–1.2)
Glucose, Bld: 135 mg/dL — ABNORMAL HIGH (ref 73–118)
Potassium: 4.1 mEq/L (ref 3.3–4.7)
Sodium: 142 mEq/L (ref 128–145)
Total Bilirubin: 0.4 mg/dl (ref 0.20–1.60)
Total Protein: 6.8 g/dL (ref 6.4–8.1)

## 2010-04-20 LAB — CEA: CEA: <0.5 ng/mL (ref 0.0–5.0)

## 2010-04-22 ENCOUNTER — Ambulatory Visit (HOSPITAL_COMMUNITY)
Admission: RE | Admit: 2010-04-22 | Discharge: 2010-04-22 | Payer: Self-pay | Source: Home / Self Care | Attending: Hematology and Oncology | Admitting: Hematology and Oncology

## 2010-04-23 LAB — IRON AND TIBC
%SAT: 47 % (ref 20–55)
Iron: 161 ug/dL — ABNORMAL HIGH (ref 42–145)
TIBC: 344 ug/dL (ref 250–470)
UIBC: 183 ug/dL

## 2010-04-23 LAB — FERRITIN: Ferritin: 2 ng/mL — ABNORMAL LOW (ref 10–291)

## 2010-04-24 ENCOUNTER — Ambulatory Visit
Admission: RE | Admit: 2010-04-24 | Discharge: 2010-04-24 | Payer: Self-pay | Source: Home / Self Care | Attending: Hematology and Oncology | Admitting: Hematology and Oncology

## 2010-04-25 ENCOUNTER — Encounter: Payer: Self-pay | Admitting: Hematology and Oncology

## 2010-04-27 ENCOUNTER — Ambulatory Visit (HOSPITAL_COMMUNITY)
Admission: RE | Admit: 2010-04-27 | Discharge: 2010-04-27 | Payer: Self-pay | Source: Home / Self Care | Attending: Hematology and Oncology | Admitting: Hematology and Oncology

## 2010-04-27 ENCOUNTER — Emergency Department (HOSPITAL_COMMUNITY)
Admission: EM | Admit: 2010-04-27 | Discharge: 2010-04-27 | Payer: Self-pay | Source: Home / Self Care | Admitting: Emergency Medicine

## 2010-04-29 LAB — CBC
MCH: 26.2 pg (ref 26.0–34.0)
MCV: 82.5 fL (ref 78.0–100.0)
Platelets: 159 10*3/uL (ref 150–400)

## 2010-04-29 LAB — BASIC METABOLIC PANEL
BUN: 13 mg/dL (ref 6–23)
Calcium: 9 mg/dL (ref 8.4–10.5)
Chloride: 106 mEq/L (ref 96–112)
Creatinine, Ser: 0.61 mg/dL (ref 0.4–1.2)
GFR calc non Af Amer: >60 mL/min (ref >60)
Glucose, Bld: 99 mg/dL (ref 70–99)
Sodium: 137 mEq/L (ref 135–145)

## 2010-05-04 ENCOUNTER — Inpatient Hospital Stay (HOSPITAL_COMMUNITY)
Admission: RE | Admit: 2010-05-04 | Discharge: 2010-05-09 | Disposition: A | Discharge status: Other Acute Inpatient Hospital | Payer: BC Managed Care – PPO | Source: Home / Self Care | Attending: Obstetrics and Gynecology | Admitting: Obstetrics and Gynecology

## 2010-05-04 DIAGNOSIS — R52 Pain, unspecified: Secondary | ICD-10-CM

## 2010-05-04 DIAGNOSIS — K631 Perforation of intestine (nontraumatic): Principal | ICD-10-CM | POA: Diagnosis present

## 2010-05-04 DIAGNOSIS — D62 Acute posthemorrhagic anemia: Secondary | ICD-10-CM | POA: Diagnosis not present

## 2010-05-04 DIAGNOSIS — I82C19 Acute embolism and thrombosis of unspecified internal jugular vein: Secondary | ICD-10-CM | POA: Diagnosis present

## 2010-05-04 DIAGNOSIS — R5381 Other malaise: Secondary | ICD-10-CM | POA: Diagnosis not present

## 2010-05-04 DIAGNOSIS — K660 Peritoneal adhesions (postprocedural) (postinfection): Secondary | ICD-10-CM | POA: Diagnosis present

## 2010-05-04 DIAGNOSIS — K630 Abscess of intestine: Secondary | ICD-10-CM | POA: Diagnosis present

## 2010-05-04 DIAGNOSIS — I10 Essential (primary) hypertension: Secondary | ICD-10-CM | POA: Diagnosis present

## 2010-05-04 DIAGNOSIS — K560 Paralytic ileus: Secondary | ICD-10-CM | POA: Diagnosis not present

## 2010-05-04 DIAGNOSIS — J45909 Unspecified asthma, uncomplicated: Secondary | ICD-10-CM | POA: Diagnosis present

## 2010-05-04 DIAGNOSIS — Z90710 Acquired absence of both cervix and uterus: Secondary | ICD-10-CM

## 2010-05-04 HISTORY — DX: Malignant (primary) neoplasm, unspecified: C80.1

## 2010-05-04 LAB — DIFFERENTIAL
Basophils Relative: 0 % (ref 0–1)
Eosinophils Relative: 0 % (ref 0–5)
Lymphs Abs: 0.3 10*3/uL — ABNORMAL LOW (ref 0.7–4.0)
Monocytes Absolute: 0.3 10*3/uL (ref 0.1–1.0)
Neutro Abs: 7.5 10*3/uL (ref 1.7–7.7)

## 2010-05-04 LAB — CBC
HCT: 26.7 % — ABNORMAL LOW (ref 36.0–46.0)
MCH: 26.8 pg (ref 26.0–34.0)
MCV: 84.2 fL (ref 78.0–100.0)
Platelets: 162 10*3/uL (ref 150–400)
RDW: 19.1 % — ABNORMAL HIGH (ref 11.5–15.5)

## 2010-05-05 LAB — CBC
MCH: 26.3 pg (ref 26.0–34.0)
MCHC: 31 g/dL (ref 30.0–36.0)
RDW: 19.8 % — ABNORMAL HIGH (ref 11.5–15.5)

## 2010-05-05 LAB — BASIC METABOLIC PANEL
Calcium: 7.3 mg/dL — ABNORMAL LOW (ref 8.4–10.5)
Creatinine, Ser: 0.71 mg/dL (ref 0.4–1.2)
GFR calc Af Amer: >60 mL/min (ref >60)
GFR calc non Af Amer: >60 mL/min (ref >60)
Glucose, Bld: 111 mg/dL — ABNORMAL HIGH (ref 70–99)
Sodium: 133 mEq/L — ABNORMAL LOW (ref 135–145)

## 2010-05-06 ENCOUNTER — Encounter: Payer: Self-pay | Admitting: Hematology and Oncology

## 2010-05-06 LAB — CBC
HCT: 28.9 % — ABNORMAL LOW (ref 36.0–46.0)
Hemoglobin: 9.2 g/dL — ABNORMAL LOW (ref 12.0–15.0)
MCH: 26.2 pg (ref 26.0–34.0)
MCV: 82.3 fL (ref 78.0–100.0)
Platelets: 123 10*3/uL — ABNORMAL LOW (ref 150–400)
RBC: 3.51 MIL/uL — ABNORMAL LOW (ref 3.87–5.11)
WBC: 10.9 10*3/uL — ABNORMAL HIGH (ref 4.0–10.5)

## 2010-05-06 LAB — DIFFERENTIAL
Eosinophils Absolute: 0 10*3/uL (ref 0.0–0.7)
Lymphocytes Relative: 7 % — ABNORMAL LOW (ref 12–46)
Lymphs Abs: 0.7 10*3/uL (ref 0.7–4.0)
Monocytes Relative: 7 % (ref 3–12)
Neutrophils Relative %: 86 % — ABNORMAL HIGH (ref 43–77)

## 2010-05-06 LAB — URINE CULTURE: Culture  Setup Time: 201201310324

## 2010-05-07 LAB — DIFFERENTIAL
Basophils Relative: 0 % (ref 0–1)
Eosinophils Absolute: 0.1 10*3/uL (ref 0.0–0.7)
Eosinophils Relative: 1 % (ref 0–5)
Lymphs Abs: 0.4 10*3/uL — ABNORMAL LOW (ref 0.7–4.0)
Monocytes Absolute: 0.5 10*3/uL (ref 0.1–1.0)
Monocytes Relative: 8 % (ref 3–12)
Neutrophils Relative %: 84 % — ABNORMAL HIGH (ref 43–77)

## 2010-05-07 LAB — CBC
MCH: 26.9 pg (ref 26.0–34.0)
MCHC: 31.6 g/dL (ref 30.0–36.0)
MCV: 84.9 fL (ref 78.0–100.0)
Platelets: 112 10*3/uL — ABNORMAL LOW (ref 150–400)
RBC: 3.24 MIL/uL — ABNORMAL LOW (ref 3.87–5.11)

## 2010-05-08 ENCOUNTER — Ambulatory Visit (HOSPITAL_COMMUNITY): Payer: BC Managed Care – PPO

## 2010-05-08 ENCOUNTER — Encounter (HOSPITAL_COMMUNITY): Payer: Self-pay | Admitting: Obstetrics and Gynecology

## 2010-05-08 LAB — CBC
MCH: 26.6 pg (ref 26.0–34.0)
MCV: 84.8 fL (ref 78.0–100.0)
Platelets: 145 10*3/uL — ABNORMAL LOW (ref 150–400)
RDW: 19 % — ABNORMAL HIGH (ref 11.5–15.5)
WBC: 5.8 10*3/uL (ref 4.0–10.5)

## 2010-05-08 LAB — DIFFERENTIAL
Basophils Relative: 0 % (ref 0–1)
Eosinophils Absolute: 0 10*3/uL (ref 0.0–0.7)
Eosinophils Relative: 1 % (ref 0–5)
Lymphs Abs: 0.5 10*3/uL — ABNORMAL LOW (ref 0.7–4.0)
Neutrophils Relative %: 78 % — ABNORMAL HIGH (ref 43–77)

## 2010-05-08 MED ORDER — IOHEXOL 300 MG/ML  SOLN: 100.0000 mL | Freq: Once | INTRAMUSCULAR | Status: AC | PRN

## 2010-05-08 MED ORDER — IOHEXOL 300 MG/ML  SOLN
100.0000 mL | Freq: Once | INTRAMUSCULAR | Status: AC | PRN
  Administered 2010-05-08: 100 mL via INTRAVENOUS

## 2010-05-09 ENCOUNTER — Other Ambulatory Visit: Payer: Self-pay | Admitting: Surgery

## 2010-05-09 ENCOUNTER — Inpatient Hospital Stay (HOSPITAL_COMMUNITY)
Admission: AD | Admit: 2010-05-09 | Discharge: 2010-05-22 | DRG: 148 | Disposition: A | Discharge status: Skilled Nursing Facility | Payer: BC Managed Care – PPO | Source: Other Acute Inpatient Hospital | Attending: Surgery | Admitting: Surgery

## 2010-05-09 LAB — BASIC METABOLIC PANEL
CO2: 26 mEq/L (ref 19–32)
Calcium: 8 mg/dL — ABNORMAL LOW (ref 8.4–10.5)
Creatinine, Ser: 0.73 mg/dL (ref 0.4–1.2)
GFR calc Af Amer: >60 mL/min (ref >60)
GFR calc non Af Amer: >60 mL/min (ref >60)

## 2010-05-09 LAB — PROTIME-INR
INR: 1.06 (ref 0.00–1.49)
INR: 1.02 (ref 0.00–1.49)
Prothrombin Time: 14 seconds (ref 11.6–15.2)
Prothrombin Time: 13.6 seconds (ref 11.6–15.2)

## 2010-05-09 LAB — CBC
Hemoglobin: 8.5 g/dL — ABNORMAL LOW (ref 12.0–15.0)
Hemoglobin: 11 g/dL — ABNORMAL LOW (ref 12.0–15.0)
MCH: 27.4 pg (ref 26.0–34.0)
MCHC: 30.8 g/dL (ref 30.0–36.0)
MCHC: 32.3 g/dL (ref 30.0–36.0)
Platelets: 149 10*3/uL — ABNORMAL LOW (ref 150–400)
RDW: 19.3 % — ABNORMAL HIGH (ref 11.5–15.5)
RDW: 18.2 % — ABNORMAL HIGH (ref 11.5–15.5)
WBC: 6 10*3/uL (ref 4.0–10.5)

## 2010-05-09 LAB — APTT: aPTT: 41 seconds — ABNORMAL HIGH (ref 24–37)

## 2010-05-10 LAB — BASIC METABOLIC PANEL
Calcium: 7.9 mg/dL — ABNORMAL LOW (ref 8.4–10.5)
Chloride: 104 mEq/L (ref 96–112)
Creatinine, Ser: 0.86 mg/dL (ref 0.4–1.2)
GFR calc Af Amer: >60 mL/min (ref >60)

## 2010-05-10 LAB — CBC
MCH: 27 pg (ref 26.0–34.0)
MCHC: 31.7 g/dL (ref 30.0–36.0)
Platelets: 154 10*3/uL (ref 150–400)
RBC: 3.82 MIL/uL — ABNORMAL LOW (ref 3.87–5.11)

## 2010-05-10 LAB — PHOSPHORUS: Phosphorus: 3.7 mg/dL (ref 2.3–4.6)

## 2010-05-10 LAB — MAGNESIUM: Magnesium: 2 mg/dL (ref 1.5–2.5)

## 2010-05-10 LAB — HEPATIC FUNCTION PANEL
Bilirubin, Direct: 0.2 mg/dL (ref 0.0–0.3)
Indirect Bilirubin: 0.4 mg/dL (ref 0.3–0.9)

## 2010-05-11 LAB — CULTURE, BLOOD (ROUTINE X 2)
Culture  Setup Time: 201201310125
Culture  Setup Time: 201201310127
Culture: NO GROWTH

## 2010-05-11 LAB — CROSSMATCH
ABO/RH(D): A POS
Antibody Screen: POSITIVE

## 2010-05-11 LAB — CBC
Hemoglobin: 9.2 g/dL — ABNORMAL LOW (ref 12.0–15.0)
RBC: 3.39 MIL/uL — ABNORMAL LOW (ref 3.87–5.11)

## 2010-05-11 LAB — BASIC METABOLIC PANEL
CO2: 27 mEq/L (ref 19–32)
Chloride: 107 mEq/L (ref 96–112)
GFR calc Af Amer: >60 mL/min (ref >60)
Sodium: 141 mEq/L (ref 135–145)

## 2010-05-12 LAB — CBC
Hemoglobin: 9.8 g/dL — ABNORMAL LOW (ref 12.0–15.0)
MCH: 27 pg (ref 26.0–34.0)
MCV: 86.5 fL (ref 78.0–100.0)
RBC: 3.63 MIL/uL — ABNORMAL LOW (ref 3.87–5.11)

## 2010-05-12 LAB — CULTURE, ROUTINE-ABSCESS

## 2010-05-13 LAB — CROSSMATCH
Antibody Screen: POSITIVE
DAT, IgG: NEGATIVE
Unit division: 0
Unit division: 0

## 2010-05-13 LAB — CBC
HCT: 29.9 % — ABNORMAL LOW (ref 36.0–46.0)
MCHC: 32.1 g/dL (ref 30.0–36.0)
MCV: 85.9 fL (ref 78.0–100.0)
RDW: 18.6 % — ABNORMAL HIGH (ref 11.5–15.5)
WBC: 6.3 10*3/uL (ref 4.0–10.5)

## 2010-05-14 LAB — ANAEROBIC CULTURE

## 2010-05-14 LAB — CBC
Hemoglobin: 9.2 g/dL — ABNORMAL LOW (ref 12.0–15.0)
MCH: 26.7 pg (ref 26.0–34.0)
MCHC: 31.4 g/dL (ref 30.0–36.0)
MCV: 85.2 fL (ref 78.0–100.0)

## 2010-05-17 LAB — COMPREHENSIVE METABOLIC PANEL
AST: 21 U/L (ref 0–37)
BUN: 4 mg/dL — ABNORMAL LOW (ref 6–23)
CO2: 30 mEq/L (ref 19–32)
Calcium: 8 mg/dL — ABNORMAL LOW (ref 8.4–10.5)
Creatinine, Ser: 0.74 mg/dL (ref 0.4–1.2)
GFR calc Af Amer: >60 mL/min (ref >60)
GFR calc non Af Amer: >60 mL/min (ref >60)
Glucose, Bld: 90 mg/dL (ref 70–99)

## 2010-05-17 LAB — URINALYSIS, ROUTINE W REFLEX MICROSCOPIC
Nitrite: NEGATIVE
Specific Gravity, Urine: 1.008 (ref 1.005–1.030)
Urobilinogen, UA: 0.2 mg/dL (ref 0.0–1.0)
pH: 7.5 (ref 5.0–8.0)

## 2010-05-17 LAB — URINE MICROSCOPIC-ADD ON

## 2010-05-18 NOTE — Op Note (Addendum)
NAMEJUSTEEN, [name]            ACCOUNT NO.:  [id number]  MEDICAL RECORD NO.:  [id number]          PATIENT TYPE:  OIB  LOCATION:  9305                          FACILITY:  [facility]  PHYSICIAN:  Osborn Coho, M.D.   DATE OF BIRTH:  19-Feb-1946  DATE OF PROCEDURE:  05/04/2010 DATE OF DISCHARGE:                              OPERATIVE REPORT   PREOPERATIVE DIAGNOSES: 1. Uterine prolapse. 2. Cystocele. 3. Rectocele. 4. Status post left colectomy and bilateral oophorectomy secondary to     colon cancer.  POSTOPERATIVE DIAGNOSES: 1. Uterine prolapse. 2. Cystocele. 3. Rectocele. 4. Status post left colectomy and bilateral oophorectomy secondary to     colon cancer.  PROCEDURES: 1. Laparoscopically-assisted vaginal hysterectomy. 2. Anterior posterior repair. 3. Cystoscopy.  ATTENDING:  Osborn Coho, MD  ASSISTANT:  Naima A. Dillard, MD  ANESTHESIA:  General.  FINDINGS:  Uterus and cervix within normal limits with small fibroid on the uterus.  No visible bilateral ovaries remaining, several adhesions of the bowel to the anterior abdominal wall, particularly around the umbilicus.  SPECIMENS TO PATHOLOGY:  Uterus and cervix, weighing 156.7 g.  FLUIDS:  2100 mL.  URINE OUTPUT:  500 mL.  ESTIMATED BLOOD LOSS:  100 mL.  COMPLICATIONS:  None.  PROCEDURE IN DETAIL:  The patient was taken to the operating room after the risks, benefits, and alternatives were discussed with the patient. The patient verbalized understanding, consent signed and witnessed.  The patient was placed under general anesthesia and prepped and draped in the normal sterile fashion in the dorsal lithotomy position.  A Hulka was placed for uterine manipulation and attention was then turned to the abdomen after gowning and re-gloving.  A 0.5% Marcaine approximately 10 mL was injected at the umbilicus and 10-mm incision made and open laparoscopy performed.  The Hasson was placed and anchored.   The pneumoperitoneum was then achieved and laparoscope introduced and bowel noted around the incision site, but the appearance of being clear from the incision.  Dilute Pitressin was injected at the suprapubic region and 10-mm incision was made and 10-mm trocar advanced under direct visualization.  The laparoscope was placed in the suprapubic port and the bowel inspected near the umbilicus and no injuries apparent.  The laparoscope was reintroduced into the local trocar and the posterior cul- de-sac was noted be free and pneumoperitoneum was then relieved after the laparoscope removed.  The fascia was closed with a purse-string stitch that was initially placed on the fascia at the umbilicus which was tied, closed.  Attention was then turned to the perineum where complete prolapse was noted.  The cervix was then injected with dilute Pitressin at a concentration of 20 units of Pitressin in 100 mL of normal saline.  The cervix was circumscribed and the anterior cul-de-sac entered as well as the posterior cul-de-sac.  Heaney clamps were used sequentially and bilaterally to clamp the paracervical tissue incorporating the cardinal and uterosacral ligaments, cut and suture ligate the tissue with 0 Vicryl.  This was done up through the parametrial tissue until the fundus was able to be exteriorized.  The remaining pedicle was clamped with Heaney clamp, cut and ligated  with 0 Vicryl tied on the right and suture ligated with 0 Vicryl as well on that side.  On the contralateral side, the pedicle was clamped, cut, and suture ligated twice with 0 Vicryl.  The small amount of bleeding noted near the cuff on the right which was made hemostatic with 3-0 Vicryl via a figure-of-eight stitch.  The angle stitches were anchored using a free needle and pulling it through the vaginal wall.  A McCall culdoplasty stitch was placed with 0 Vicryl.  The cuff was repaired with 0 Vicryl via figure-of-eight stitches  and dilute Pitressin was injected into the anterior vaginal wall after tying the McCall culdoplasty stitch.  The anterior vaginal wall was then incised and the underlying tissue was dissected away from the anterior vaginal wall and cystocele repaired with 2-0 Vicryl via Kelly plication stitches.  Approximately 1/2 cm of vaginal wall tissue was excised and the anterior vaginal wall was repaired with 2-0 Vicryl via interrupted stitches.  Attention was then turned to the posterior vagina and the posterior vaginal wall was injected with dilute Pitressin.  This area was incised and the underlying tissue dissected away and the rectocele repaired with 2-0 Vicryl via plication stitches.  The perineal body was repaired with 0 Vicryl via interrupted stitches and the posterior vaginal wall was repaired with 2-0 Vicryl via figure-of-eight stitch at the apex and a running interlocking stitch along the remainder of the posterior vaginal wall.  Good hemostasis was noted.  The patient was injected with indigo carmine and cystoscopy performed after removing the Foley.  There were no inadvertent bladder injuries and bilateral ureters noted to efflux without difficulty.  Attention was then turned to the abdomen once again where the 10-mm laparoscope was introduced into the suprapubic 10-mm trocar.  There was good hemostasis at the cuff.  Pneumoperitoneum was relieved.  Sponge, lap, and needle count was correct.  The fascia was repaired at the suprapubic incision with 0 Vicryl via figure-of-eight stitch and the skin was reapproximated at the umbilicus and the suprapubic incisions via 3-0 Monocryl via subcuticular stitch and then Dermabond was applied.  Sponge, lap, and needle count was correct.  The patient tolerated procedure well and was awaiting transfer to the recovery room in good condition.     Osborn Coho, M.D.     AR/MEDQ  D:  05/04/2010  T:  05/05/2010  Job:  [id number]  Electronically  Signed by Osborn Coho M.D. on 05/18/2010 04:29:50 PM

## 2010-05-18 NOTE — H&P (Addendum)
[name], [name]            ACCOUNT NO.:  [id number]  MEDICAL RECORD NO.:  [id number]           PATIENT TYPE:  LOCATION:                                 FACILITY:  PHYSICIAN:  Osborn Coho, M.D.   DATE OF BIRTH:  12/29/1945  DATE OF ADMISSION:  05/04/2010 DATE OF DISCHARGE:                             HISTORY & PHYSICAL   CHIEF COMPLAINT:  Severe and ulcerative uterine prolapse.  HISTORY:  [name] is a 65 year old single black female, para 3-0-0-3 with severe uterine prolapse and ulceration of the cervix status post hysteroscopy, D and C with benign findings in November, who is desiring definitive management with hysterectomy.  The patient also has significant pelvic relaxation with the cystocele and rectocele and the possibility of anteroposterior repair will also be performed at the time of the hysterectomy.  She reports a history of bilateral oophorectomies at the time of her left colectomy, however, there appears to be any ovarian remnants, those may possibly be removed as well as her fallopian tubes.  A discussion took place with [name] regarding not doing the procedure and just observing, doing a pessary and doing the procedure. The patient is not a great candidate for a pessary secondary to the ulcerative nature of her cervix which would likely not heal if a pressure was in place and the patient no longer wants to endorse the significant uterine prolapse that she currently endorses.  The risks, benefits, and alternatives were discussed at length with [name] and those risks included bleeding, infection, injury, possible blood transfusion, and the patient would like to proceed with a laparoscopically assisted vaginal hysterectomy, possible anteroposterior repair, and possible bilateral single salpingo-oophorectomy and possible abdominal hysterectomy.  PAST OBSTETRICAL HISTORY:  Normal spontaneous vaginal deliveries x3.  PAST GYNECOLOGICAL HISTORY:   Menarche at 86-1/65 years old.  She is currently postmenopausal for many years.  She denies any history of sexually transmitted diseases or abnormal Pap smears.  Her last Pap smear was in October 2011.  She does report occasional spotting and that is likely result of the polyp that she had removed and also the ulcerative nature of her cervix.  PAST MEDICAL HISTORY: 1. History of colon cancer. 2. Asthma. 3. Hypertension. 4. Anemia. 5. Rheumatoid arthritis. 6. DVT at the site of her Port-A-Cath.  PAST SURGICAL HISTORY: 1. Status post left colectomy and bilateral oophorectomy as a result     of colon cancer.  The patient denies any positive anesthesia or any     history of blood transfusions. 2. Hysteroscopy, D and C.  FAMILY HISTORY:  Hypertension and colon cancer.  MEDICATIONS:  Multivitamin, potassium, clonidine, diltiazem, labetalol, Benicar, Bystolic, and Lovenox which was instructed to be discontinued at least 24 hours prior to surgery and according to Dr. Lonell Face office, the patient was instructed to discontinue her Lovenox on May 02, 2010.  ALLERGIES:  No known drug allergies or sensitivities to peanuts, shellfish, latex, or soy products.  REVIEW OF SYSTEMS:  She denies any recent illnesses.  Ultrasound in October 2011, showed the uterus to measure 9.7 x 6.1 x 4.6 cm.  No ovaries were identified.  PHYSICAL EXAMINATION:  HEENT:  Within normal limits. HEART:  Regular rate and rhythm. CHEST:  Clear to auscultation bilaterally. ABDOMEN:  Soft and nontender. PELVIC:  Complete procidentia with cystocele and rectocele.  ASSESSMENT AND PLAN:  [name] is a 65 year old para 3 with significant and symptomatic complete uterine prolapse, cystocele and rectocele, planning to undergo a laparoscopically assisted vaginal hysterectomy, possible anteroposterior repair, possible bilateral salpingo- oophorectomy, and possible abdominal hysterectomy.  The risks, benefits, and  alternatives have been discussed at length with [name] including, but not limited to bleeding, infection, and injury.  The patient has consented for blood transfusion if needed.  The patient's questions were answered, a consent was signed and witnessed in the office and a hospital consent will be signed and witnessed as well prior to the procedure.     Osborn Coho, M.D.     AR/MEDQ  D:  05/04/2010  T:  05/04/2010  Job:  [id number]  Electronically Signed by Osborn Coho M.D. on 07/02/2010 09:48:14 AM

## 2010-05-19 LAB — CBC
HCT: 27.1 % — ABNORMAL LOW (ref 36.0–46.0)
MCV: 83.9 fL (ref 78.0–100.0)
RBC: 3.23 MIL/uL — ABNORMAL LOW (ref 3.87–5.11)
WBC: 3.8 10*3/uL — ABNORMAL LOW (ref 4.0–10.5)

## 2010-05-19 LAB — URINE CULTURE

## 2010-05-19 NOTE — Op Note (Signed)
[name], [name]            ACCOUNT NO.:  [id number]  MEDICAL RECORD NO.:  [id number]           PATIENT TYPE:  I  LOCATION:  1233                         FACILITY[facility]  [facility]  PHYSICIAN:  Ardeth Sportsman, MD     DATE OF BIRTH:  [birth year]  DATE OF PROCEDURE:  05/09/2010 DATE OF DISCHARGE:                              OPERATIVE REPORT   MEDICAL ONCOLOGIST:  Vicente Serene I. Odogwu, MD  OPERATING SURGEON:  Ardeth Sportsman, MD  ASSISTANT:  Osborn Coho, MD  PREOPERATIVE DIAGNOSIS:  Pneumoperitoneum and free fluid, question of small bowel leak.    POSTOPERATIVE DIAGNOSIS:  Jejunal intestinal leak with interloop abscesses and contamination.  PROCEDURE PERFORMED: 1. Exploratory laparotomy. 2. Lysis of adhesions, x60 minutes. 3. Small bowel resection with side-to-side stapled anastomosis. 4. Drainage of interloop abscesses. 5. Drainage of pelvic abscesses. 6. Placement of pelvic drain.  SPECIMEN: 1. Jejunal loop. 2. Abscess wall for culture.  ESTIMATED BLOOD LOSS:  300 mL.  DRAINS:  A left upper quadrant drain going through the left paracolic gutter down into the pelvis and up towards the right paracolic gutter.  INDICATIONS:  [name] is a 65 year old morbidly obese female with problems of uterine who is a survivor of rectal cancer followed by Dr. Arlan Organ.  She had uterine prolapse with frank ulceration as well as cystocele and rectocele.  She underwent diagnostic laparoscopy and vaginal hysterectomy and anterior and posterior cystocele and rectocele repair on May 04, 2010.  She has been able to advance her diet and has had flatus and bowel movements.  She has had some persistent periumbilical pain.  She has been having fevers as high as 102.  She is followed by Dr. Su Hilt. Initially, CAT scan did not look any suspicious but followup CAT scan was little more suspicious for possible small bowel leak.  Therefore, she requested a surgical consultation last  night.  The patient on the floor was not tachycardic with a white count looking normal, but on IV antibiotics.  However, she had persistent abdominal pain.  CT scan was suspicious for small bowel leak and I discussed the case with Dr. Jeronimo Greaves with Radiology.  The narrative physiology was explained to the patient as well as her family.  Differential diagnosis was discussed.  After discussions, recommendations were made for exploratory laparotomy, lysis of adhesions, possible small bowel resection.  Possibility of negative laparotomy was discussed as well and possibility of small bowel injury and significant mortality associated with this was discussed.  Questions answered and the family agreed to proceed.  OPERATIVE FINDINGS:  She had moderate dense adhesions of small bowel and periumbilical region with a small bowel leak.  It did not look ischemic. She had moderate phlegmon, especially of greater omentum on the anterior abdominal wall region.  She had pockets of interloop abscesses in the small bowel and down the pelvis as well with moderate phlegmon.  Most of her small bowel was not involved or infected or inflamed.  She had a large but soft hemangioma filling up the phlegmon on the right hepatic lobe but no discrete nodules or masses.  No evidence of any  carcinomatosis as well.  DESCRIPTION OF PROCEDURE:  Informed consent was confirmed.  The patient was already on IV Invanz and fluconazole.  She underwent general anesthesia without any difficulty.  She had Foley catheter in place. She was positioned supine.  Her abdomen was prepped and draped in sterile fashion.  She had a nasogastric cleanly placed.  Surgical time- out confirmed our plan.  I entered the abdomen through a periumbilical midline incision heading more in the supraumbilical region.  We used cautery to get the subcutaneous tissues down the fascia, then grabbed the fascia with Kochers and cut through the  preperitoneal fat and peritoneum sharply with a scalpel and cleaned into the peritoneal cavity.  She had a moderate phlegmon of adhesions to the small bowel.  I was able to sweep this off with a finger and made sure I was in the true peritoneal cavity.  She had obvious dense ward of small bowel infraumbilically.  I swept some small bowel away and was able to sharpy freeze some small bowel off the fascia.  I continued the incision infraumbilically.  Given her abdominal girth and the peritonitis and phlegmon inflammation, I extended the incision more superiorly and inferiorly.    Upon entering the peritoneal cavity periumbilically, she had some obvious staining of succus.  I found small bowel loop with a 1-cm opening.  Mucosa was viable and there was no evidence of any ischemia.  I cannot see any associated stitches, however, this seemed to be the source of the leak. She had moderate phlegmon and abscesses as well.  I was able to eventually sharply free adhesions to the anterior abdominal wall from bilateral paracolic gutters and up towards the falciform ligament and down towards the pelvis.  I found the greater omentum and freed the adhesions off the small bowel as well.  With that, I found some interloop abscesses and phlegmon at the base of the jejunal mesentery and ileal mesentery.  I mobilized the small bowel and ran and found the obvious perforation.  She had a moderate phlegmon of small bowel associated with this.    Therefore, I excised a total of 20 cm of jejunum given the moderate phlegmon and inflamed mesentery.  I did a side-to-side stapled anastomosis with a GIA-75 and closing the common defect with a TA-60 stapler.  I took the mesentery with clamps and 0-silk ties.  I did a crotch stitch as well.  Mesentery was laid well.  I closed the mesenteric defect using 2-0 silk and Vicryl stitches closing the mesentery transversely to help patch the staple line.  The antimesenteric  TA-60 staple line acted as a mesenteric patch over that to help decrease the chance of leak.  The anastomosis rested well without any twist or torsion and no ischemia.  At this point, I did copious irrigation of over 10 L in all upper quadrants and down the pelvis, especially into the abscess.  There appeared a moderate amount of phlegmon off the small bowel mesentery and small bowel serosa.  I went down to the pelvis well.  We sent some of that for culture.  I could feel the vaginal cuff and closure down the pelvis, it was intact and no obvious leak there.  There was no leak on the vagina.  There was some ischemia at the greater omentum with distant greater omental defects.  Therefore, we trimmed back some of the greater omentum with clamps and ties until it was a little more healthy and viable.  More  proximal greater omentum had a very thick phlegmon but distally I had it run along the left paracolic gutter and it seemed to be viable.  I mobilized that off, so that it could lay more in the midline.  She had some oozing from the peritonitis off the greater omentum and small bowel mesentery but after doing copious irrigation and doing some focused touch up with electrocautery away from the small bowel, I achieved good hemostasis.  I did irrigation with few more liters until it had a clear return.  The small bowel was edematous but not particularly dilated and when I had done the small bowel anastomosis before I closed the common defect, I did actually aspirate some succus and did not have to aspirate much in the way to help decrease it further.  I was able to return the small bowel completely into the abdominal wall.  The small bowel resection and new anastomosis allowed to lay towards the right paracolic gutter posteriorly where it laid naturally.  I returned the greater omentum to the anterior abdomen as I had to close the wound down.  I closed the wound using 0-PDS and looped PDS.   I used #1 PDS internal retention sutures x6 to help close it down.  I did copious irrigation in the subcutaneous tissues as well.  I did do local anesthetic 30 mL around the fascia.  I did put a couple of staples around the umbilicus to help reapproximate that and one supraumbilically and one infraumbilically to help mildly and proximally the wound.  However, because this was a shown abscess with small bowel contamination onto the peritoneum, I did not feel it was a good idea to close this fully.  The wound VAC was not available, so therefore I packed it with some dilute Betadine soaked roll of 3-inch gauze.  Sterile dressing was applied.  I placed a drain and secured it with a nylon stitch.  The patient is being extubated and taken to the recovery room in stable condition.  Given the fact that this was a bowel surgery, we will try and transfer her to [facility] where colon surgeries are often performed versus keeping her here in the ICU/Step-Down at [facility].  There is no family immediately available postoperatively or Dr. Su Hilt.  I will try and discuss with him later.     Ardeth Sportsman, MD     SCG/MEDQ  D:  05/09/2010  T:  05/10/2010  Job:  [id number]  Electronically Signed by Karie Soda MD on 05/12/2010 10:15:26 AM

## 2010-05-27 ENCOUNTER — Emergency Department (HOSPITAL_COMMUNITY)
Admission: EM | Admit: 2010-05-27 | Discharge: 2010-05-27 | Disposition: A | Discharge status: Skilled Nursing Facility | Payer: BC Managed Care – PPO | Attending: Emergency Medicine | Admitting: Emergency Medicine

## 2010-05-27 ENCOUNTER — Emergency Department (HOSPITAL_COMMUNITY): Payer: BC Managed Care – PPO

## 2010-05-27 DIAGNOSIS — R569 Unspecified convulsions: Secondary | ICD-10-CM | POA: Insufficient documentation

## 2010-05-27 DIAGNOSIS — Z86718 Personal history of other venous thrombosis and embolism: Secondary | ICD-10-CM | POA: Insufficient documentation

## 2010-05-27 DIAGNOSIS — E876 Hypokalemia: Secondary | ICD-10-CM | POA: Insufficient documentation

## 2010-05-27 DIAGNOSIS — N390 Urinary tract infection, site not specified: Secondary | ICD-10-CM | POA: Insufficient documentation

## 2010-05-27 DIAGNOSIS — I10 Essential (primary) hypertension: Secondary | ICD-10-CM | POA: Insufficient documentation

## 2010-05-27 DIAGNOSIS — Z79899 Other long term (current) drug therapy: Secondary | ICD-10-CM | POA: Insufficient documentation

## 2010-05-27 DIAGNOSIS — Z85038 Personal history of other malignant neoplasm of large intestine: Secondary | ICD-10-CM | POA: Insufficient documentation

## 2010-05-27 LAB — URINALYSIS, ROUTINE W REFLEX MICROSCOPIC
Nitrite: NEGATIVE
Protein, ur: 30 mg/dL — AB
Urine Glucose, Fasting: NEGATIVE mg/dL
pH: 7 (ref 5.0–8.0)

## 2010-05-27 LAB — POCT CARDIAC MARKERS
CKMB, poc: 1.8 ng/mL (ref 1.0–8.0)
Myoglobin, poc: 341 ng/mL (ref 12–200)
Troponin i, poc: <0.05 ng/mL (ref 0.00–0.09)

## 2010-05-27 LAB — COMPREHENSIVE METABOLIC PANEL
Alkaline Phosphatase: 74 U/L (ref 39–117)
CO2: 28 mEq/L (ref 19–32)
Calcium: 8.4 mg/dL (ref 8.4–10.5)
Chloride: 107 mEq/L (ref 96–112)
GFR calc Af Amer: >60 mL/min (ref >60)
Potassium: 3.2 mEq/L — ABNORMAL LOW (ref 3.5–5.1)
Sodium: 142 mEq/L (ref 135–145)
Total Protein: 7 g/dL (ref 6.0–8.3)

## 2010-05-27 LAB — CBC
HCT: 29.7 % — ABNORMAL LOW (ref 36.0–46.0)
Hemoglobin: 9.3 g/dL — ABNORMAL LOW (ref 12.0–15.0)
MCHC: 31.3 g/dL (ref 30.0–36.0)
RBC: 3.47 MIL/uL — ABNORMAL LOW (ref 3.87–5.11)

## 2010-05-27 LAB — DIFFERENTIAL
Basophils Absolute: 0 10*3/uL (ref 0.0–0.1)
Basophils Relative: 0 % (ref 0–1)
Lymphocytes Relative: 34 % (ref 12–46)
Monocytes Absolute: 0.2 10*3/uL (ref 0.1–1.0)
Monocytes Relative: 9 % (ref 3–12)
Neutro Abs: 1.5 10*3/uL — ABNORMAL LOW (ref 1.7–7.7)
Neutrophils Relative %: 57 % (ref 43–77)

## 2010-05-27 LAB — URINE MICROSCOPIC-ADD ON

## 2010-06-08 NOTE — Discharge Summary (Signed)
NAMEMONIKA, CHESTANG            ACCOUNT NO.:  [id number]  MEDICAL RECORD NO.:  [id number]           PATIENT TYPE:  I  LOCATION:  1523                         FACILITY:  [facility]  PHYSICIAN:  Thornton Park. Daphine Deutscher, MD  DATE OF BIRTH:  04-07-1945  DATE OF ADMISSION:  05/09/2010 DATE OF DISCHARGE:  05/22/2010                              DISCHARGE SUMMARY   HISTORY OF PRESENT ILLNESS:  Ms Spampinato is a pleasant 65 year old female who had a recent hysteroscopy D and C with benign findings in November who subsequently presented for hysterectomy.  This was carried out on May 05, 2010.  The patient subsequently developed continued abdominal discomfort and pain.  Followup imaging findings showed evidence of pneumoperitoneum and free fluid, questionable evidence of bowel leak.  Dr. Michaell Cowing evaluated the patient on May 09, 2010, and with these findings decided that operative management would be best.  SUMMARY OF HOSPITAL COURSE:  The patient was admitted on May 09, 2010, was taken to the operating room same day.  She underwent exploratory laparotomy, lysis of adhesions, partial small bowel resection with side-to-side anastomosis, drainage of multiple interloop and pelvic abscesses, and placement of a pelvic drain.  The patient was admitted to the floor at [facility] in a stable condition.  She did develop mild ileus as the return of her bowel function slowly improved. She had some acute blood loss anemia that stabilized, never requiring transfusion.  Eventually, her bowel function returned, and the patient's diet was gradually advanced to liquid diet and subsequent full liquid diet and eventually soft regular diet.  Again, her hemoglobin remained stable throughout her postoperative course.  Most recent hemoglobin on May 19, 2010, was 8.6.  The patient's other concomitant medical problems including hypertension remained within normal limits as the patient was started on her regular  medications.  Her activity, however, did deteriorate a little bit during the recovery course.  The patient did develop a little bit of deconditioning and therefore based on the recommendations of Physical Therapy and Occupational Therapy it was felt that the patient would benefit from skilled nursing facility for rehabilitation prior to discharge home as the patient does live at home by herself and has limited help.  Therefore, arrangements were coordinated with the social worker for placement to a skilled nursing facility.  I believe a bed has been found and approved and the patient is now pending discharge.  Of note, the patient also recently had been diagnosed with a DVT at the site of her prior Port-A-Cath placement in the right internal jugular vein.  The patient has been placed on Lovenox at a therapeutic dose for this and has been maintained on therapeutic Lovenox throughout her hospitalization.  At this point, we feel the patient is stable for discharge to skilled nursing facility with all arrangements made.  DISCHARGE DIAGNOSES: 1. Small bowel perforation, status post partial small bowel resection. 2. Status post hysterectomy. 3. 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 4. Hypertension. 5. Asthma. 6. Rheumatoid arthritis. 7. Anemia - stable.  DISCHARGE MEDICATIONS: 1. Benicar HCT 40/25 one tablet daily. 2. Bystolic 10 mg 1 tablet daily. 3. Clonidine 0.1  mg 1 tablet twice daily. 4. Diltiazem 180 mg daily. 5. Labetalol 100 mg twice daily. 6. Potassium 20 mEq 1 tablet 3 times daily. 7. Proventil inhaler 2 puffs q.4h. p.r.n. 8. Lovenox 130 mg subcutaneous injection daily until instructed,     otherwise. 9. Percocet 5/325 one to two tablets q.4-6h. p.r.n. severe pain.  DISCHARGE INSTRUCTIONS:  The patient has several small open wounds along her midline incision that are currently being packed with normal saline and moist gauze on a daily basis.  This should be  continued at nursing facility as well as at home once discharged from the nursing facility. She needs to participate in therapy for rehabilitation.  She needs to come back in the office in approximately 2 week's time to see Dr. Karie Soda at [facility].  She also needs to follow up with Dr. Dalene Carrow regarding the DVT and PE.     Brayton El, PA-C   ______________________________ Thornton Park Daphine Deutscher, MD    KB/MEDQ  D:  05/22/2010  T:  05/22/2010  Job:  [id number]  cc:   Ardeth Sportsman, MD [address] [place] [address]  Osborn Coho, M.D. Fax: [phone]  Lauretta I. Odogwu, M.D. Fax: [phone]  Electronically Signed by Brayton El  on 06/02/2010 11:31:49 AM Electronically Signed by Luretha Murphy MD on 06/08/2010 01:33:55 PM

## 2010-06-16 LAB — BASIC METABOLIC PANEL
BUN: 3 mg/dL — ABNORMAL LOW (ref 6–23)
BUN: 7 mg/dL (ref 6–23)
CO2: 27 mEq/L (ref 19–32)
CO2: 28 mEq/L (ref 19–32)
Calcium: 8.3 mg/dL — ABNORMAL LOW (ref 8.4–10.5)
Calcium: 8.8 mg/dL (ref 8.4–10.5)
Chloride: 107 mEq/L (ref 96–112)
Creatinine, Ser: 0.62 mg/dL (ref 0.4–1.2)
Creatinine, Ser: 0.77 mg/dL (ref 0.4–1.2)
GFR calc Af Amer: >60 mL/min (ref >60)
Glucose, Bld: 115 mg/dL — ABNORMAL HIGH (ref 70–99)
Glucose, Bld: 95 mg/dL (ref 70–99)

## 2010-06-16 LAB — CBC
HCT: 28.4 % — ABNORMAL LOW (ref 36.0–46.0)
Hemoglobin: 9.3 g/dL — ABNORMAL LOW (ref 12.0–15.0)
MCH: 27.1 pg (ref 26.0–34.0)
MCH: 28.6 pg (ref 26.0–34.0)
MCHC: 32.1 g/dL (ref 30.0–36.0)
MCHC: 32.7 g/dL (ref 30.0–36.0)
MCV: 84.5 fL (ref 78.0–100.0)
MCV: 87.4 fL (ref 78.0–100.0)
Platelets: 139 10*3/uL — ABNORMAL LOW (ref 150–400)

## 2010-06-16 LAB — PROTIME-INR: Prothrombin Time: 12.9 seconds (ref 11.6–15.2)

## 2010-06-18 ENCOUNTER — Other Ambulatory Visit: Payer: Self-pay | Admitting: Hematology and Oncology

## 2010-06-18 DIAGNOSIS — Z85038 Personal history of other malignant neoplasm of large intestine: Secondary | ICD-10-CM

## 2010-06-19 LAB — URINALYSIS, ROUTINE W REFLEX MICROSCOPIC
Glucose, UA: NEGATIVE mg/dL
Hgb urine dipstick: NEGATIVE
Protein, ur: NEGATIVE mg/dL
Specific Gravity, Urine: 1.014 (ref 1.005–1.030)
Urobilinogen, UA: 0.2 mg/dL (ref 0.0–1.0)

## 2010-06-19 LAB — DIFFERENTIAL
Basophils Absolute: 0 10*3/uL (ref 0.0–0.1)
Eosinophils Absolute: 0.1 10*3/uL (ref 0.0–0.7)
Lymphocytes Relative: 8 % — ABNORMAL LOW (ref 12–46)
Monocytes Relative: 9 % (ref 3–12)
Neutro Abs: 7.5 10*3/uL (ref 1.7–7.7)
Neutrophils Relative %: 82 % — ABNORMAL HIGH (ref 43–77)

## 2010-06-19 LAB — CBC
HCT: 31.1 % — ABNORMAL LOW (ref 36.0–46.0)
MCV: 86.2 fL (ref 78.0–100.0)
Platelets: 102 10*3/uL — ABNORMAL LOW (ref 150–400)
RBC: 3.6 MIL/uL — ABNORMAL LOW (ref 3.87–5.11)
RDW: 23.8 % — ABNORMAL HIGH (ref 11.5–15.5)
WBC: 9.1 10*3/uL (ref 4.0–10.5)

## 2010-06-19 LAB — PROTIME-INR
INR: 1.8 — ABNORMAL HIGH (ref 0.00–1.49)
Prothrombin Time: 21.1 seconds — ABNORMAL HIGH (ref 11.6–15.2)

## 2010-06-19 LAB — POCT I-STAT, CHEM 8
BUN: 8 mg/dL (ref 6–23)
Calcium, Ion: 1.17 mmol/L (ref 1.12–1.32)
Chloride: 105 mEq/L (ref 96–112)
Creatinine, Ser: 0.7 mg/dL (ref 0.4–1.2)
Glucose, Bld: 82 mg/dL (ref 70–99)
HCT: 34 % — ABNORMAL LOW (ref 36.0–46.0)
Potassium: 3.4 mEq/L — ABNORMAL LOW (ref 3.5–5.1)

## 2010-06-26 ENCOUNTER — Ambulatory Visit (HOSPITAL_COMMUNITY)
Admission: RE | Admit: 2010-06-26 | Discharge: 2010-06-26 | Disposition: A | Discharge status: Home / Self Care | Payer: BC Managed Care – PPO | Source: Ambulatory Visit | Attending: Hematology and Oncology | Admitting: Hematology and Oncology

## 2010-06-26 ENCOUNTER — Other Ambulatory Visit: Payer: Self-pay | Admitting: Hematology and Oncology

## 2010-06-26 ENCOUNTER — Encounter (HOSPITAL_COMMUNITY): Payer: Self-pay

## 2010-06-26 ENCOUNTER — Encounter (HOSPITAL_BASED_OUTPATIENT_CLINIC_OR_DEPARTMENT_OTHER): Payer: BC Managed Care – PPO | Admitting: Hematology and Oncology

## 2010-06-26 DIAGNOSIS — C787 Secondary malignant neoplasm of liver and intrahepatic bile duct: Secondary | ICD-10-CM

## 2010-06-26 DIAGNOSIS — R634 Abnormal weight loss: Secondary | ICD-10-CM | POA: Insufficient documentation

## 2010-06-26 DIAGNOSIS — D649 Anemia, unspecified: Secondary | ICD-10-CM

## 2010-06-26 DIAGNOSIS — Z85038 Personal history of other malignant neoplasm of large intestine: Secondary | ICD-10-CM

## 2010-06-26 DIAGNOSIS — C189 Malignant neoplasm of colon, unspecified: Secondary | ICD-10-CM

## 2010-06-26 DIAGNOSIS — D1803 Hemangioma of intra-abdominal structures: Secondary | ICD-10-CM | POA: Insufficient documentation

## 2010-06-26 DIAGNOSIS — I82C29 Chronic embolism and thrombosis of unspecified internal jugular vein: Secondary | ICD-10-CM

## 2010-06-26 HISTORY — DX: Essential (primary) hypertension: I10

## 2010-06-26 LAB — CMP (CANCER CENTER ONLY)
Albumin: 3.1 g/dL — ABNORMAL LOW (ref 3.3–5.5)
Alkaline Phosphatase: 66 U/L (ref 26–84)
BUN, Bld: 11 mg/dL (ref 7–22)
Calcium: 8.9 mg/dL (ref 8.0–10.3)
Creat: 1 mg/dl (ref 0.6–1.2)
Glucose, Bld: 104 mg/dL (ref 73–118)
Potassium: 4.5 mEq/L (ref 3.3–4.7)

## 2010-06-26 LAB — CBC WITH DIFFERENTIAL/PLATELET
Basophils Absolute: 0 10*3/uL (ref 0.0–0.1)
Eosinophils Absolute: 0 10*3/uL (ref 0.0–0.5)
HCT: 26.3 % — ABNORMAL LOW (ref 34.8–46.6)
HGB: 8.9 g/dL — ABNORMAL LOW (ref 11.6–15.9)
MCH: 29.3 pg (ref 25.1–34.0)
MCV: 86.3 fL (ref 79.5–101.0)
MONO%: 11.7 % (ref 0.0–14.0)
NEUT#: 1.2 10*3/uL — ABNORMAL LOW (ref 1.5–6.5)
NEUT%: 59.7 % (ref 38.4–76.8)
Platelets: 93 10*3/uL — ABNORMAL LOW (ref 145–400)
RDW: 22.9 % — ABNORMAL HIGH (ref 11.2–14.5)

## 2010-06-26 MED ORDER — IOHEXOL 300 MG/ML  SOLN
100.0000 mL | Freq: Once | INTRAMUSCULAR | Status: AC | PRN
  Administered 2010-06-26: 100 mL via INTRAVENOUS

## 2010-06-29 LAB — DIFFERENTIAL
Basophils Absolute: 0 10*3/uL (ref 0.0–0.1)
Eosinophils Relative: 0 % (ref 0–5)
Lymphs Abs: 1.2 10*3/uL (ref 0.7–4.0)
Monocytes Absolute: 0.3 10*3/uL (ref 0.1–1.0)
Monocytes Relative: 1 % — ABNORMAL LOW (ref 3–12)
Neutro Abs: 28.1 10*3/uL — ABNORMAL HIGH (ref 1.7–7.7)

## 2010-06-29 LAB — POCT I-STAT, CHEM 8
BUN: 12 mg/dL (ref 6–23)
Calcium, Ion: 1.19 mmol/L (ref 1.12–1.32)
Creatinine, Ser: 0.9 mg/dL (ref 0.4–1.2)
Hemoglobin: 12.6 g/dL (ref 12.0–15.0)
TCO2: 30 mmol/L (ref 0–100)

## 2010-06-29 LAB — CBC
HCT: 36.3 % (ref 36.0–46.0)
Hemoglobin: 12 g/dL (ref 12.0–15.0)
MCV: 89.2 fL (ref 78.0–100.0)
RDW: 14.3 % (ref 11.5–15.5)

## 2010-06-29 LAB — HEPATIC FUNCTION PANEL
ALT: 11 U/L (ref 0–35)
AST: 20 U/L (ref 0–37)
Albumin: 3.2 g/dL — ABNORMAL LOW (ref 3.5–5.2)
Alkaline Phosphatase: 103 U/L (ref 39–117)
Total Protein: 6.5 g/dL (ref 6.0–8.3)

## 2010-06-29 LAB — PROTIME-INR: INR: 0.91 (ref 0.00–1.49)

## 2010-07-01 ENCOUNTER — Encounter (HOSPITAL_BASED_OUTPATIENT_CLINIC_OR_DEPARTMENT_OTHER): Payer: BC Managed Care – PPO | Admitting: Hematology and Oncology

## 2010-07-01 DIAGNOSIS — C787 Secondary malignant neoplasm of liver and intrahepatic bile duct: Secondary | ICD-10-CM

## 2010-07-01 DIAGNOSIS — Z7901 Long term (current) use of anticoagulants: Secondary | ICD-10-CM

## 2010-07-01 DIAGNOSIS — C189 Malignant neoplasm of colon, unspecified: Secondary | ICD-10-CM

## 2010-07-01 DIAGNOSIS — I82C29 Chronic embolism and thrombosis of unspecified internal jugular vein: Secondary | ICD-10-CM

## 2010-07-02 NOTE — Discharge Summary (Addendum)
[name], [name]            ACCOUNT NO.:  [id number]  MEDICAL RECORD NO.:  [id number]           PATIENT TYPE:  LOCATION:                                 FACILITY:  PHYSICIAN:  Osborn Coho, M.D.   DATE OF BIRTH:  03/29/1946  DATE OF ADMISSION:  04/07/2010 DATE OF DISCHARGE:  05/09/2010                              DISCHARGE SUMMARY   DISCHARGE DIAGNOSES:  Uterine prolapse, cystocele, rectocele, bowel injury status post left colectomy with bilateral salpingo-oophorectomy secondary to colon cancer.  OPERATION:  On the date of admission, the patient underwent a laparoscopically-assisted vaginal hysterectomy with anterior-posterior repair and cystoscopy tolerating procedures well.  The patient was found to have a uterus and cervix that were within normal limits though a small fibroid was observed on the uterus.  There were no visible ovaries remaining and several adhesions of the bowel to the anterior abdominal wall were noted particularly around the umbilicus.  HISTORY OF PRESENT ILLNESS:  [name] is a 65 year old single black female, para 3-0-0-3 who is status post colon cancer presenting for hysterectomy with anterior-posterior repair because of complete uterine prolapse, ulceration of the cervix, and symptomatic pelvic relaxation. Please see the patient's dictated history and physical examination for details.  PREOPERATIVE PHYSICAL EXAMINATION:  General exam was within normal limits.  Pelvic exam revealed complete procidentia with cystocele and rectocele.  HOSPITAL COURSE:  On the date of admission, the patient underwent aforementioned procedures, tolerating them all well.  The patient's postoperative course was marked by fluctuations in her temperature to a maximum degree of 102.4 degrees Fahrenheit orally and symptomatic anemia with a hemoglobin/hematocrit of 7.4/23.9.  The patient first spiked a fever on postop day #1 at which time she received a CT scan of  her abdomen and pelvis that did not reveal any postoperative complications. The patient also had blood cultures done which returned negative. Additionally, the patient was transfused 1 unit of packed red blood cells which improved her anemia symptoms and raised her hemoglobin/hematocrit to 9.1/29.0.  By postop day #3, though the patient did not complain of any abdominal pain, she did have some diarrhea and continued to have fluctuations in her temperature.  A repeat CT scan of her abdomen and pelvis showed findings that were suspicious for bowel perforation with mesenteric fluid and developing abscesses.  Also noted were increased small bilateral pleural effusions with small bowel dilatation favored to be secondary to an  adynamic ileus.  Consequently, the patient received a general surgery consultation and was taken to the operating room for exploratory laparotomy at [facility]. She was admitted to the service of general surgeon, Karie Soda, M.D.  Pathology report, uterus and cervix 156.7 grams:  Cervix-hyperkeratosis with squamous hyperplasia and ulceration consistent with prolapse; endometrium - atrophic no evidence of hyperplasia or carcinoma; myometrium - adenomyosis, leiomyomata with degenerative changes intramural and subserosal; and uterine serosa - unremarkable.  No endometriosis or evidence of malignancy.     Elmira J. Lowell Guitar, P.A.-C   ______________________________ Osborn Coho, M.D.    EJP/MEDQ  D:  06/12/2010  T:  06/13/2010  Job:  [id number]  Electronically Signed by Kerry Fort  POWELL P.A. on 06/24/2010 05:59:04 PM Electronically Signed by Osborn Coho M.D. on 07/02/2010 09:48:18 AM

## 2010-08-26 ENCOUNTER — Encounter (HOSPITAL_BASED_OUTPATIENT_CLINIC_OR_DEPARTMENT_OTHER): Payer: BC Managed Care – PPO | Admitting: Hematology and Oncology

## 2010-08-26 DIAGNOSIS — C189 Malignant neoplasm of colon, unspecified: Secondary | ICD-10-CM

## 2010-08-26 DIAGNOSIS — C787 Secondary malignant neoplasm of liver and intrahepatic bile duct: Secondary | ICD-10-CM

## 2010-08-26 DIAGNOSIS — Z452 Encounter for adjustment and management of vascular access device: Secondary | ICD-10-CM

## 2010-09-07 ENCOUNTER — Emergency Department (HOSPITAL_COMMUNITY)
Admission: EM | Admit: 2010-09-07 | Discharge: 2010-09-07 | Disposition: A | Discharge status: Home / Self Care | Payer: BC Managed Care – PPO | Attending: Emergency Medicine | Admitting: Emergency Medicine

## 2010-09-07 DIAGNOSIS — Z79899 Other long term (current) drug therapy: Secondary | ICD-10-CM | POA: Insufficient documentation

## 2010-09-07 DIAGNOSIS — Z8505 Personal history of malignant neoplasm of liver: Secondary | ICD-10-CM | POA: Insufficient documentation

## 2010-09-07 DIAGNOSIS — M069 Rheumatoid arthritis, unspecified: Secondary | ICD-10-CM | POA: Insufficient documentation

## 2010-09-07 DIAGNOSIS — Z86718 Personal history of other venous thrombosis and embolism: Secondary | ICD-10-CM | POA: Insufficient documentation

## 2010-09-07 DIAGNOSIS — J45909 Unspecified asthma, uncomplicated: Secondary | ICD-10-CM | POA: Insufficient documentation

## 2010-09-07 DIAGNOSIS — R51 Headache: Secondary | ICD-10-CM | POA: Insufficient documentation

## 2010-09-07 DIAGNOSIS — R002 Palpitations: Secondary | ICD-10-CM | POA: Insufficient documentation

## 2010-09-07 DIAGNOSIS — E876 Hypokalemia: Secondary | ICD-10-CM | POA: Insufficient documentation

## 2010-09-07 DIAGNOSIS — I10 Essential (primary) hypertension: Secondary | ICD-10-CM | POA: Insufficient documentation

## 2010-09-07 DIAGNOSIS — D649 Anemia, unspecified: Secondary | ICD-10-CM | POA: Insufficient documentation

## 2010-09-07 DIAGNOSIS — Z85038 Personal history of other malignant neoplasm of large intestine: Secondary | ICD-10-CM | POA: Insufficient documentation

## 2010-09-07 LAB — CBC
HCT: 25.8 % — ABNORMAL LOW (ref 36.0–46.0)
Hemoglobin: 8.9 g/dL — ABNORMAL LOW (ref 12.0–15.0)
MCH: 33.6 pg (ref 26.0–34.0)
MCV: 97.4 fL (ref 78.0–100.0)
RBC: 2.65 MIL/uL — ABNORMAL LOW (ref 3.87–5.11)

## 2010-09-07 LAB — BASIC METABOLIC PANEL
BUN: 11 mg/dL (ref 6–23)
CO2: 27 mEq/L (ref 19–32)
Chloride: 106 mEq/L (ref 96–112)
Creatinine, Ser: 0.63 mg/dL (ref 0.4–1.2)
GFR calc Af Amer: >60 mL/min (ref >60)
Glucose, Bld: 103 mg/dL — ABNORMAL HIGH (ref 70–99)

## 2010-10-27 ENCOUNTER — Other Ambulatory Visit: Payer: Self-pay | Admitting: Hematology and Oncology

## 2010-10-27 ENCOUNTER — Encounter (HOSPITAL_BASED_OUTPATIENT_CLINIC_OR_DEPARTMENT_OTHER): Payer: BC Managed Care – PPO | Admitting: Hematology and Oncology

## 2010-10-27 DIAGNOSIS — C189 Malignant neoplasm of colon, unspecified: Secondary | ICD-10-CM

## 2010-10-27 DIAGNOSIS — C787 Secondary malignant neoplasm of liver and intrahepatic bile duct: Secondary | ICD-10-CM

## 2010-10-27 DIAGNOSIS — Z7901 Long term (current) use of anticoagulants: Secondary | ICD-10-CM

## 2010-10-27 DIAGNOSIS — Z452 Encounter for adjustment and management of vascular access device: Secondary | ICD-10-CM

## 2010-10-27 LAB — CBC WITH DIFFERENTIAL/PLATELET
BASO%: 1.3 % (ref 0.0–2.0)
Basophils Absolute: 0 10*3/uL (ref 0.0–0.1)
EOS%: 0.9 % (ref 0.0–7.0)
Eosinophils Absolute: 0 10*3/uL (ref 0.0–0.5)
HCT: 29 % — ABNORMAL LOW (ref 34.8–46.6)
HGB: 9.6 g/dL — ABNORMAL LOW (ref 11.6–15.9)
LYMPH%: 56.1 % — ABNORMAL HIGH (ref 14.0–49.7)
MCH: 31.8 pg (ref 25.1–34.0)
MCHC: 33.2 g/dL (ref 31.5–36.0)
MCV: 95.7 fL (ref 79.5–101.0)
MONO#: 0.2 10*3/uL (ref 0.1–0.9)
MONO%: 9.8 % (ref 0.0–14.0)
NEUT#: 0.7 10*3/uL — ABNORMAL LOW (ref 1.5–6.5)
NEUT%: 31.9 % — ABNORMAL LOW (ref 38.4–76.8)
Platelets: 96 10*3/uL — ABNORMAL LOW (ref 145–400)
RBC: 3.03 10*6/uL — ABNORMAL LOW (ref 3.70–5.45)
RDW: 14.6 % — ABNORMAL HIGH (ref 11.2–14.5)
WBC: 2.1 10*3/uL — ABNORMAL LOW (ref 3.9–10.3)
lymph#: 1.2 10*3/uL (ref 0.9–3.3)

## 2010-10-27 LAB — COMPREHENSIVE METABOLIC PANEL
ALT: 16 U/L (ref 0–35)
CO2: 24 mEq/L (ref 19–32)
Creatinine, Ser: 0.78 mg/dL (ref 0.50–1.10)
Glucose, Bld: 81 mg/dL (ref 70–99)
Total Bilirubin: 0.2 mg/dL — ABNORMAL LOW (ref 0.3–1.2)

## 2010-10-27 LAB — CEA: CEA: <0.5 ng/mL (ref 0.0–5.0)

## 2010-10-30 ENCOUNTER — Encounter (HOSPITAL_BASED_OUTPATIENT_CLINIC_OR_DEPARTMENT_OTHER): Payer: BC Managed Care – PPO | Admitting: Hematology and Oncology

## 2010-10-30 ENCOUNTER — Other Ambulatory Visit: Payer: Self-pay | Admitting: Hematology and Oncology

## 2010-10-30 DIAGNOSIS — C787 Secondary malignant neoplasm of liver and intrahepatic bile duct: Secondary | ICD-10-CM

## 2010-10-30 DIAGNOSIS — C189 Malignant neoplasm of colon, unspecified: Secondary | ICD-10-CM

## 2010-10-30 DIAGNOSIS — D702 Other drug-induced agranulocytosis: Secondary | ICD-10-CM

## 2010-10-30 DIAGNOSIS — D696 Thrombocytopenia, unspecified: Secondary | ICD-10-CM

## 2010-12-22 ENCOUNTER — Ambulatory Visit (HOSPITAL_BASED_OUTPATIENT_CLINIC_OR_DEPARTMENT_OTHER): Payer: BC Managed Care – PPO | Admitting: Hematology and Oncology

## 2010-12-22 DIAGNOSIS — C787 Secondary malignant neoplasm of liver and intrahepatic bile duct: Secondary | ICD-10-CM

## 2010-12-22 DIAGNOSIS — C189 Malignant neoplasm of colon, unspecified: Secondary | ICD-10-CM

## 2010-12-22 DIAGNOSIS — Z452 Encounter for adjustment and management of vascular access device: Secondary | ICD-10-CM

## 2011-01-13 ENCOUNTER — Other Ambulatory Visit: Payer: Self-pay

## 2011-01-29 ENCOUNTER — Encounter: Payer: Self-pay | Admitting: Hematology and Oncology

## 2011-02-02 ENCOUNTER — Ambulatory Visit (INDEPENDENT_AMBULATORY_CARE_PROVIDER_SITE_OTHER): Payer: BC Managed Care – PPO | Admitting: General Surgery

## 2011-02-02 ENCOUNTER — Encounter (INDEPENDENT_AMBULATORY_CARE_PROVIDER_SITE_OTHER): Payer: Self-pay | Admitting: General Surgery

## 2011-02-02 VITALS — BP 138/72 | HR 68 | Temp 97.8°F | Resp 20 | Ht 62.0 in | Wt 159.8 lb

## 2011-02-02 DIAGNOSIS — N63 Unspecified lump in unspecified breast: Secondary | ICD-10-CM

## 2011-02-02 DIAGNOSIS — Z85038 Personal history of other malignant neoplasm of large intestine: Secondary | ICD-10-CM | POA: Insufficient documentation

## 2011-02-02 NOTE — Progress Notes (Signed)
Chief Complaint  Patient presents with  . Other    new pt- eval mgm    HPI Shalia Bartko Mortellaro is a 65 y.o. female.  Referred by Dr. Dominga Ferry HPI This is a 65 year old female who by her medical history murmur she tells me has stage IV colon cancer who being care for by Dr. Dalene Carrow. She has completed her chemotherapy is due to get scans again next month. I'll need to talk to  her about her care before we proceed with this plan. She apparently also has a history of a DVT as well at the site of a prior Port-A-Cath placement in her right internal jugular vein. She also has a history of a a a recent bowel injury followed by a operation with a small bowel resection. She had a fairly long hospital stay for this as well. She's been doing well without any complaints referable to her breasts at all. She has been getting routine imaging. She had an area noted in 2010 this changed over some time. This area in October of 2012 showed a lobulated mass in the subareolar subareolar region of the left breast it has increased in size over this time. An ultrasound showed that it does appear to be stable in size but there were some more internal echoes. A this was decided then to undergo ultrasound guided vacuum assisted core biopsy through a Lovenox window and the findings showed this to be extensive stromal fibrosis. She comes in today referred for an excisional biopsy.  Past Medical History  Diagnosis Date  . 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 Hypertension   . Colon cancer   . Asthma   . DVT (deep venous thrombosis)     right jugular  . Anemia   . Arthritis   . Clotting disorder   . Seizures     Past Surgical History  Procedure Date  . Abdominal hysterectomy 2012  . Small intestine surgery     Family History  Problem Relation Age of Onset  . Hypertension Father   . Cancer Brother     colon  . Cancer Daughter     colon    Social History History  Substance Use Topics    . Smoking status: Never Smoker   . Smokeless tobacco: Not on file  . Alcohol Use: No    No Known Allergies  Current Outpatient Prescriptions  Medication Sig Dispense Refill  . cloNIDine (CATAPRES) 0.1 MG tablet Take 0.1 mg by mouth 2 (two) times daily.        Marland Kitchen diltiazem (CARDIZEM CD) 180 MG 24 hr capsule Take 180 mg by mouth daily.        . Enoxaparin Sodium (LOVENOX [place]) Inject 130 mg into the skin daily.        . Fluticasone-Salmeterol (ADVAIR) 100-50 MCG/DOSE AEPB Inhale 2 puffs into the lungs as needed.        Marland Kitchen ibuprofen (ADVIL,MOTRIN) 400 MG tablet Ad lib.      Marland Kitchen labetalol (NORMODYNE) 100 MG tablet Take 100 mg by mouth daily.        Marland Kitchen levETIRAcetam (KEPPRA) 250 MG tablet Take 250 mg by mouth every 12 (twelve) hours.        . lidocaine-prilocaine (EMLA) cream Apply topically as needed.        . Multiple Vitamin (MULTIVITAMIN) tablet Take 1 tablet by mouth daily.        . nebivolol (BYSTOLIC) 10  MG tablet Take 10 mg by mouth daily.        Marland Kitchen olmesartan-hydrochlorothiazide (BENICAR HCT) 40-25 MG per tablet Take 1 tablet by mouth daily.        Marland Kitchen oxyCODONE-acetaminophen (PERCOCET) 5-325 MG per tablet Take 1 tablet by mouth every 4 (four) hours as needed. Take 1-2 tablets Q 4-6 hours PRN severe pain       . potassium chloride SA (K-DUR,KLOR-CON) 20 MEQ tablet Take 20 mEq by mouth 3 (three) times daily.        . temazepam (RESTORIL) 15 MG capsule Ad lib.        Review of Systems Review of Systems  Constitutional: Negative for fever, chills and unexpected weight change.  HENT: Negative for hearing loss, congestion, sore throat, trouble swallowing and voice change.   Eyes: Negative for visual disturbance.  Respiratory: Positive for cough. Negative for wheezing.   Cardiovascular: Negative for chest pain, palpitations and leg swelling.  Gastrointestinal: Positive for diarrhea. Negative for nausea, vomiting, abdominal pain, constipation, blood in stool, abdominal distention and anal  bleeding.  Genitourinary: Negative for hematuria, vaginal bleeding and difficulty urinating.  Musculoskeletal: Negative for arthralgias.  Skin: Negative for rash and wound.  Neurological: Negative for seizures, syncope and headaches.  Hematological: Negative for adenopathy. Does not bruise/bleed easily.  Psychiatric/Behavioral: Negative for confusion.    Blood pressure 138/72, pulse 68, temperature 97.8 F (36.6 C), temperature source Temporal, resp. rate 20, height 5\' 2"  (1.575 m), weight 159 lb 12.8 oz (72.485 kg).  Physical Exam Physical Exam  Constitutional: She appears well-developed and well-nourished.  Neck: Neck supple.  Cardiovascular: Normal rate, regular rhythm and normal heart sounds.   Pulmonary/Chest: Effort normal and breath sounds normal. She has no wheezes. She has no rales. Right breast exhibits no inverted nipple, no mass, no nipple discharge, no skin change and no tenderness. Left breast exhibits mass (vague left subareolar mass). Left breast exhibits no inverted nipple, no nipple discharge, no skin change and no tenderness. Breasts are symmetrical.  Abdominal: Soft. There is no hepatomegaly.    Lymphadenopathy:    She has no cervical adenopathy.    Data Reviewed Mammograms and u/s from [facility] reviewed  Assessment    Left breast mass    Plan    We discussed a left breast wire localization biopsy with the wrist and benefits associated with that. Before we do that I will discuss with both the radiologist as well as her medical oncologist performance of this procedure as well as a long-term plan for her also. I told Mrs. Facey I would  get back to her after I discussed this with both of them.       Raybon Conard 02/02/2011, 12:12 PM

## 2011-02-03 ENCOUNTER — Encounter (INDEPENDENT_AMBULATORY_CARE_PROVIDER_SITE_OTHER): Payer: Self-pay

## 2011-02-07 ENCOUNTER — Other Ambulatory Visit: Payer: Self-pay | Admitting: Hematology and Oncology

## 2011-02-07 DIAGNOSIS — C189 Malignant neoplasm of colon, unspecified: Secondary | ICD-10-CM

## 2011-02-12 ENCOUNTER — Encounter: Payer: Self-pay | Admitting: *Deleted

## 2011-02-15 ENCOUNTER — Telehealth: Payer: Self-pay | Admitting: Hematology and Oncology

## 2011-02-15 ENCOUNTER — Other Ambulatory Visit: Payer: Self-pay | Admitting: Hematology and Oncology

## 2011-02-15 DIAGNOSIS — C189 Malignant neoplasm of colon, unspecified: Secondary | ICD-10-CM

## 2011-02-15 MED ORDER — HEPARIN SOD (PORK) LOCK FLUSH 100 UNIT/ML IV SOLN
500.0000 [IU] | Freq: Once | INTRAVENOUS | Status: DC
  Filled 2011-02-15: qty 5

## 2011-02-15 MED ORDER — SODIUM CHLORIDE 0.9 % IJ SOLN
10.0000 mL | INTRAMUSCULAR | Status: DC | PRN
  Filled 2011-02-15: qty 10

## 2011-02-15 NOTE — Telephone Encounter (Signed)
Added flush appt for 12/26. Called pt but was not able to reach her (line busy). Added comment to 11/13 appt to send pt for dec flush.

## 2011-02-16 ENCOUNTER — Ambulatory Visit (HOSPITAL_BASED_OUTPATIENT_CLINIC_OR_DEPARTMENT_OTHER): Payer: BC Managed Care – PPO

## 2011-02-16 ENCOUNTER — Other Ambulatory Visit: Payer: Self-pay | Admitting: Hematology and Oncology

## 2011-02-16 ENCOUNTER — Ambulatory Visit (HOSPITAL_COMMUNITY)
Admission: RE | Admit: 2011-02-16 | Discharge: 2011-02-16 | Disposition: A | Discharge status: Home / Self Care | Payer: BC Managed Care – PPO | Source: Ambulatory Visit | Attending: Hematology and Oncology | Admitting: Hematology and Oncology

## 2011-02-16 ENCOUNTER — Other Ambulatory Visit (HOSPITAL_BASED_OUTPATIENT_CLINIC_OR_DEPARTMENT_OTHER): Payer: BC Managed Care – PPO | Admitting: Lab

## 2011-02-16 VITALS — BP 132/77 | HR 63 | Temp 97.9°F

## 2011-02-16 DIAGNOSIS — C186 Malignant neoplasm of descending colon: Secondary | ICD-10-CM

## 2011-02-16 DIAGNOSIS — I2510 Atherosclerotic heart disease of native coronary artery without angina pectoris: Secondary | ICD-10-CM | POA: Insufficient documentation

## 2011-02-16 DIAGNOSIS — R599 Enlarged lymph nodes, unspecified: Secondary | ICD-10-CM | POA: Insufficient documentation

## 2011-02-16 DIAGNOSIS — Z85038 Personal history of other malignant neoplasm of large intestine: Secondary | ICD-10-CM | POA: Insufficient documentation

## 2011-02-16 DIAGNOSIS — C189 Malignant neoplasm of colon, unspecified: Secondary | ICD-10-CM

## 2011-02-16 DIAGNOSIS — D1803 Hemangioma of intra-abdominal structures: Secondary | ICD-10-CM | POA: Insufficient documentation

## 2011-02-16 DIAGNOSIS — D739 Disease of spleen, unspecified: Secondary | ICD-10-CM | POA: Insufficient documentation

## 2011-02-16 DIAGNOSIS — R911 Solitary pulmonary nodule: Secondary | ICD-10-CM | POA: Insufficient documentation

## 2011-02-16 DIAGNOSIS — C787 Secondary malignant neoplasm of liver and intrahepatic bile duct: Secondary | ICD-10-CM

## 2011-02-16 DIAGNOSIS — K5730 Diverticulosis of large intestine without perforation or abscess without bleeding: Secondary | ICD-10-CM | POA: Insufficient documentation

## 2011-02-16 DIAGNOSIS — D649 Anemia, unspecified: Secondary | ICD-10-CM

## 2011-02-16 DIAGNOSIS — R634 Abnormal weight loss: Secondary | ICD-10-CM | POA: Insufficient documentation

## 2011-02-16 DIAGNOSIS — Z452 Encounter for adjustment and management of vascular access device: Secondary | ICD-10-CM

## 2011-02-16 DIAGNOSIS — K7689 Other specified diseases of liver: Secondary | ICD-10-CM | POA: Insufficient documentation

## 2011-02-16 DIAGNOSIS — C801 Malignant (primary) neoplasm, unspecified: Secondary | ICD-10-CM | POA: Insufficient documentation

## 2011-02-16 DIAGNOSIS — D1809 Hemangioma of other sites: Secondary | ICD-10-CM | POA: Insufficient documentation

## 2011-02-16 DIAGNOSIS — Z90710 Acquired absence of both cervix and uterus: Secondary | ICD-10-CM | POA: Insufficient documentation

## 2011-02-16 DIAGNOSIS — Z9221 Personal history of antineoplastic chemotherapy: Secondary | ICD-10-CM | POA: Insufficient documentation

## 2011-02-16 LAB — CBC WITH DIFFERENTIAL/PLATELET
BASO%: 1.1 % (ref 0.0–2.0)
EOS%: 1 % (ref 0.0–7.0)
HCT: 33.2 % — ABNORMAL LOW (ref 34.8–46.6)
LYMPH%: 51.6 % — ABNORMAL HIGH (ref 14.0–49.7)
MCH: 31.8 pg (ref 25.1–34.0)
MCHC: 33.5 g/dL (ref 31.5–36.0)
NEUT%: 37.8 % — ABNORMAL LOW (ref 38.4–76.8)
Platelets: 100 10*3/uL — ABNORMAL LOW (ref 145–400)
RBC: 3.49 10*6/uL — ABNORMAL LOW (ref 3.70–5.45)

## 2011-02-16 LAB — CMP (CANCER CENTER ONLY)
ALT(SGPT): 21 U/L (ref 10–47)
AST: 19 U/L (ref 11–38)
Alkaline Phosphatase: 38 U/L (ref 26–84)
Creat: 0.9 mg/dl (ref 0.6–1.2)
Sodium: 142 mEq/L (ref 128–145)
Total Bilirubin: 0.5 mg/dl (ref 0.20–1.60)

## 2011-02-16 LAB — CEA: CEA: <0.5 ng/mL (ref 0.0–5.0)

## 2011-02-16 MED ORDER — IOHEXOL 300 MG/ML  SOLN
100.0000 mL | Freq: Once | INTRAMUSCULAR | Status: AC | PRN
  Administered 2011-02-16: 100 mL via INTRAVENOUS

## 2011-02-16 MED ORDER — HEPARIN SOD (PORK) LOCK FLUSH 100 UNIT/ML IV SOLN
500.0000 [IU] | Freq: Once | INTRAVENOUS | Status: AC | PRN
  Administered 2011-02-16: 500 [IU] via INTRAVENOUS
  Filled 2011-02-16: qty 5

## 2011-02-16 MED ORDER — SODIUM CHLORIDE 0.9 % IJ SOLN
10.0000 mL | INTRAMUSCULAR | Status: DC | PRN
  Administered 2011-02-16: 10 mL via INTRAVENOUS
  Filled 2011-02-16: qty 10

## 2011-02-19 ENCOUNTER — Ambulatory Visit (HOSPITAL_BASED_OUTPATIENT_CLINIC_OR_DEPARTMENT_OTHER): Payer: BC Managed Care – PPO | Admitting: Hematology and Oncology

## 2011-02-19 ENCOUNTER — Telehealth: Payer: Self-pay | Admitting: Hematology and Oncology

## 2011-02-19 DIAGNOSIS — N63 Unspecified lump in unspecified breast: Secondary | ICD-10-CM

## 2011-02-19 DIAGNOSIS — C787 Secondary malignant neoplasm of liver and intrahepatic bile duct: Secondary | ICD-10-CM

## 2011-02-19 DIAGNOSIS — C189 Malignant neoplasm of colon, unspecified: Secondary | ICD-10-CM

## 2011-02-19 DIAGNOSIS — C186 Malignant neoplasm of descending colon: Secondary | ICD-10-CM

## 2011-02-19 DIAGNOSIS — Z86718 Personal history of other venous thrombosis and embolism: Secondary | ICD-10-CM

## 2011-02-19 NOTE — Progress Notes (Signed)
CC:   George R. Kilpatrick, M.D. Edward Levine, MD John C. Hayes, M.D. Matthew C Wakefield, MD Yijun Yan, MD  IDENTIFYING STATEMENT:  The patient is a 65-year-old woman who presents for followup.  INTERIM HISTORY:  [name] has had several medical concerns.  Mammogram had noted a lobulated mass in the subareolar region of the left breast. This area had increased in size over time thus she underwent ultrasound guided assisted core biopsy and findings revealed extensive stromal fibrosis.  She was referred to Dr. Wakefield for an excisional biopsy. Dr. Wakefield and I talked about her case and we both felt that a decision about biopsy would be made following her staging CT scans.    [name]'s recent CT scans of the chest, abdomen and pelvis on 02/16/2011 revealed no new or progressive disease within the chest.  The subpleural lymph nodes on the left remain unchanged.  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 There was a well-circumscribed hypoattenuating area along the falciform ligament, measuring 2.5 x 3.4 cm consistent with a stable ablation defect.  Within the abdomen and pelvis there were enlarging abdominal retroperitoneal nodes suspicious for nodal metastases.    [name]'s other medical problems include history of a right jugular DVT currently anticoagulated with Lovenox.  History of stage IV colon cancer with isolated metastasis to liver status post colectomy and radiofrequency ablation followed by chemotherapy FOLFOX 6 and Avastin for 12 cycles and history of bowel injury followed by small bowel resection with prolonged hospital stay.  She otherwise feels well.  She denies pain.  She denies fever, chills, night sweats.  She has not lost weight.  She is eating.  She is not fatigued.  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  MEDICATIONS:  Reviewed and updated.  ALLERGIES:  None.  Past medical history,  family history, social history unchanged.  REVIEW OF SYSTEMS:  Ten point review of systems negative.  PHYSICAL EXAMINATION:  General:  The patient is a well-appearing, well- nourished woman in no distress.  Vital signs:  Pulse 66, blood pressure 108/62, temperature 98.2, respirations 18, weight 162 pounds.  HEENT: Head is atraumatic, normocephalic.  Sclerae anicteric.  Mouth moist. Chest:  Clear.  Port-A-Cath without signs of infection.  Abdomen:  Soft, nontender.  Bowel sounds present.  Extremities:  No edema.  Pulses present and symmetrical.  LAB DATA:  On 02/16/2011 white cell count 2, hemoglobin 11.1, hematocrit 33.2, platelets 100, ANC 800.  Sodium 142, potassium 4.3, chloride 102, CO2 29, BUN 10, creatinine 0.9, glucose 87.  T bilirubin 0.5, alkaline phosphatase 38.  AST 19, ALT 21, calcium 8.9.  CEA less than 0.5.  IMPRESSION AND PLAN: 1. [name] is a 65-year-old woman with stage IV colon cancer with     isolated metastases to the liver.  She is status post colectomy as     well as radiofrequency ablation to isolated liver mets on     05/05/2009 at [facility].  She then went on to     receive chemotherapy FOLFOX 6 and Avastin between 06/08/2009     through 12/19/2009.  She required dose reduction including     chemotherapy due to thrombocytopenia and leukopenia.  She also has     a finding of a left breast mass, biopsy consistent with fibrosis.     Her current CT scans noted findings of retroperitoneal lymph nodes.       So with that said we will arrange a PET scan and a CT guided     biopsy. 2. Pancytopenia.  The patient is afebrile with no evidence of     bleeding.  This may be related to chemotherapy but I am hoping that     the patient can have a CT guided biopsy of the bone marrow when she     receives the biopsy of lymph node. 3. History of right jugular vein deep vein thrombosis, anticoagulated     with Lovenox.  The patient follows up  soon.    ______________________________ Ersa Delaney I Shreeya Recendiz, M.D. LIO/MEDQ  D:  02/19/2011  T:  02/19/2011  Job:  [id number] 

## 2011-02-19 NOTE — Telephone Encounter (Signed)
Pt is set up for PET SCAN for 11/27th , pt aware , CT biopsy called in to [facility] scheduling, will call pt to schedule as soon as Radiologist review the case.

## 2011-02-19 NOTE — Progress Notes (Signed)
This office note has been dictated.

## 2011-02-22 ENCOUNTER — Telehealth: Payer: Self-pay | Admitting: Hematology and Oncology

## 2011-02-22 ENCOUNTER — Telehealth: Payer: Self-pay | Admitting: *Deleted

## 2011-02-22 NOTE — Telephone Encounter (Signed)
Received call from  Tobi Bastos in radiology re:   Pt is scheduled for  BM Bx  On  03/08/11.    Pt will need to be off  Lovenox for 1 day  Prior to procedure and in writing .    Message relayed to  md   For advice on  02/26/11. Anna's   Phone    [phone].

## 2011-02-22 NOTE — Telephone Encounter (Signed)
Checked w/tiffany @ [facility] today re status of bx (ct guided bmbx/core bx of lymph node). Per tiffany pt is scheduled for 12/3 + is aware. No other orders.

## 2011-02-26 ENCOUNTER — Telehealth: Payer: Self-pay | Admitting: Nurse Practitioner

## 2011-02-26 NOTE — Telephone Encounter (Signed)
Attempted to call pt with instructions re: Lovenox.  Per Dr. Dalene Carrow- hold lovenox 24 hours prior to biopsy.  Received no answer at home message.

## 2011-02-26 NOTE — Telephone Encounter (Signed)
Called patient to instruct to hold Lovenox for 24 hours prior to bone marrow biopsy.

## 2011-02-26 NOTE — Telephone Encounter (Signed)
Faxed order to Tobi Bastos in interventional radiology stating- "Hold Lovenox 24 hours prior to biopsy."

## 2011-03-02 ENCOUNTER — Encounter (HOSPITAL_COMMUNITY): Payer: Self-pay

## 2011-03-02 ENCOUNTER — Telehealth: Payer: Self-pay | Admitting: *Deleted

## 2011-03-02 ENCOUNTER — Encounter (HOSPITAL_COMMUNITY)
Admission: RE | Admit: 2011-03-02 | Discharge: 2011-03-02 | Disposition: A | Discharge status: Home / Self Care | Payer: BC Managed Care – PPO | Source: Ambulatory Visit | Attending: Hematology and Oncology | Admitting: Hematology and Oncology

## 2011-03-02 DIAGNOSIS — C189 Malignant neoplasm of colon, unspecified: Secondary | ICD-10-CM

## 2011-03-02 DIAGNOSIS — N63 Unspecified lump in unspecified breast: Secondary | ICD-10-CM | POA: Insufficient documentation

## 2011-03-02 DIAGNOSIS — Z853 Personal history of malignant neoplasm of breast: Secondary | ICD-10-CM | POA: Insufficient documentation

## 2011-03-02 DIAGNOSIS — R599 Enlarged lymph nodes, unspecified: Secondary | ICD-10-CM | POA: Insufficient documentation

## 2011-03-02 DIAGNOSIS — Z9221 Personal history of antineoplastic chemotherapy: Secondary | ICD-10-CM | POA: Insufficient documentation

## 2011-03-02 LAB — GLUCOSE, CAPILLARY: Glucose-Capillary: 84 mg/dL (ref 70–99)

## 2011-03-02 MED ORDER — FLUDEOXYGLUCOSE F - 18 (FDG) INJECTION
17.6000 | Freq: Once | INTRAVENOUS | Status: AC | PRN
  Administered 2011-03-02: 17.6 via INTRAVENOUS

## 2011-03-02 NOTE — Telephone Encounter (Signed)
Pt came for PET scan today in radiology.    Spoke with Brett Canales in radiology and asked him to relay message to pt re:   Pt to hold  Lovenox  On  03/07/11  -   24 hrs  Prior to biopsy  Scheduled for 03/08/11     Brett Canales stated he would relay message to pt.

## 2011-03-05 ENCOUNTER — Other Ambulatory Visit (HOSPITAL_COMMUNITY): Payer: Self-pay | Admitting: *Deleted

## 2011-03-05 ENCOUNTER — Other Ambulatory Visit: Payer: Self-pay | Admitting: Radiology

## 2011-03-08 ENCOUNTER — Other Ambulatory Visit (HOSPITAL_COMMUNITY): Payer: BC Managed Care – PPO

## 2011-03-08 ENCOUNTER — Telehealth: Payer: Self-pay | Admitting: *Deleted

## 2011-03-08 ENCOUNTER — Ambulatory Visit (HOSPITAL_COMMUNITY)
Admission: RE | Admit: 2011-03-08 | Discharge: 2011-03-08 | Disposition: A | Discharge status: Home / Self Care | Payer: BC Managed Care – PPO | Source: Ambulatory Visit | Attending: Hematology and Oncology | Admitting: Hematology and Oncology

## 2011-03-08 DIAGNOSIS — N63 Unspecified lump in unspecified breast: Secondary | ICD-10-CM

## 2011-03-08 DIAGNOSIS — C189 Malignant neoplasm of colon, unspecified: Secondary | ICD-10-CM

## 2011-03-08 MED ORDER — SODIUM CHLORIDE 0.9 % IV SOLN: Freq: Once | INTRAVENOUS | Status: DC

## 2011-03-08 NOTE — Telephone Encounter (Signed)
Received call from pt informing nurse re:   Pt did not have biopsy done today 03/08/11  Due to pt  Forgot and ate this am.    Pt stated the biopsy has been rescheduled for 03/15/11 at 0730 am. Instructed pt to resume Lovenox injection daily.  Pt  Understood to  STOP Lovenox injection on "Sunday  03/14/11   For biopsy on 03/15/11.     Pt voiced understanding. Pt's   Phone     [phone].

## 2011-03-09 ENCOUNTER — Ambulatory Visit (HOSPITAL_COMMUNITY): Payer: BC Managed Care – PPO

## 2011-03-11 ENCOUNTER — Other Ambulatory Visit (HOSPITAL_COMMUNITY): Payer: BC Managed Care – PPO

## 2011-03-12 ENCOUNTER — Other Ambulatory Visit: Payer: Self-pay | Admitting: Neurology

## 2011-03-12 ENCOUNTER — Encounter (HOSPITAL_COMMUNITY): Payer: Self-pay | Admitting: Pharmacy Technician

## 2011-03-12 ENCOUNTER — Other Ambulatory Visit (HOSPITAL_COMMUNITY): Payer: Self-pay | Admitting: *Deleted

## 2011-03-15 ENCOUNTER — Ambulatory Visit (HOSPITAL_COMMUNITY)
Admission: RE | Admit: 2011-03-15 | Discharge: 2011-03-15 | Disposition: A | Discharge status: Home / Self Care | Payer: BC Managed Care – PPO | Source: Ambulatory Visit | Attending: Hematology and Oncology | Admitting: Hematology and Oncology

## 2011-03-15 ENCOUNTER — Encounter (HOSPITAL_COMMUNITY): Payer: Self-pay

## 2011-03-15 DIAGNOSIS — N63 Unspecified lump in unspecified breast: Secondary | ICD-10-CM | POA: Insufficient documentation

## 2011-03-15 DIAGNOSIS — D61818 Other pancytopenia: Secondary | ICD-10-CM | POA: Insufficient documentation

## 2011-03-15 DIAGNOSIS — Z86718 Personal history of other venous thrombosis and embolism: Secondary | ICD-10-CM | POA: Insufficient documentation

## 2011-03-15 DIAGNOSIS — Z9221 Personal history of antineoplastic chemotherapy: Secondary | ICD-10-CM | POA: Insufficient documentation

## 2011-03-15 DIAGNOSIS — C189 Malignant neoplasm of colon, unspecified: Secondary | ICD-10-CM | POA: Insufficient documentation

## 2011-03-15 DIAGNOSIS — R599 Enlarged lymph nodes, unspecified: Secondary | ICD-10-CM | POA: Insufficient documentation

## 2011-03-15 DIAGNOSIS — C787 Secondary malignant neoplasm of liver and intrahepatic bile duct: Secondary | ICD-10-CM | POA: Insufficient documentation

## 2011-03-15 HISTORY — DX: Other reaction to spinal and lumbar puncture: G97.1

## 2011-03-15 HISTORY — DX: Other specified postprocedural states: R11.2

## 2011-03-15 HISTORY — DX: Other specified postprocedural states: Z98.890

## 2011-03-15 LAB — APTT: aPTT: 46 seconds — ABNORMAL HIGH (ref 24–37)

## 2011-03-15 LAB — CBC
Hemoglobin: 10.7 g/dL — ABNORMAL LOW (ref 12.0–15.0)
MCHC: 33.2 g/dL (ref 30.0–36.0)
Platelets: 101 10*3/uL — ABNORMAL LOW (ref 150–400)
RDW: 13.4 % (ref 11.5–15.5)

## 2011-03-15 LAB — PROTIME-INR
INR: 0.94 (ref 0.00–1.49)
Prothrombin Time: 12.8 seconds (ref 11.6–15.2)

## 2011-03-15 MED ORDER — SODIUM CHLORIDE 0.9 % IV SOLN
INTRAVENOUS | Status: DC
  Administered 2011-03-15: 08:00:00 via INTRAVENOUS

## 2011-03-15 MED ORDER — MIDAZOLAM HCL 5 MG/5ML IJ SOLN
INTRAMUSCULAR | Status: AC | PRN
  Administered 2011-03-15 (×2): 1 mg via INTRAVENOUS
  Administered 2011-03-15: 2 mg via INTRAVENOUS

## 2011-03-15 MED ORDER — HEPARIN SOD (PORK) LOCK FLUSH 100 UNIT/ML IV SOLN
500.0000 [IU] | Freq: Once | INTRAVENOUS | Status: DC
  Filled 2011-03-15: qty 5

## 2011-03-15 MED ORDER — FENTANYL CITRATE 0.05 MG/ML IJ SOLN
INTRAMUSCULAR | Status: AC | PRN
  Administered 2011-03-15 (×2): 50 ug via INTRAVENOUS
  Administered 2011-03-15: 100 ug via INTRAVENOUS

## 2011-03-15 MED ORDER — HEPARIN SOD (PORK) LOCK FLUSH 100 UNIT/ML IV SOLN
INTRAVENOUS | Status: AC
  Filled 2011-03-15: qty 5

## 2011-03-15 NOTE — Progress Notes (Signed)
Rt pac prepped per protocol/ flushed c 10cc ns + 5cc heparin flush (pt denies prior problem c heparin) HPN removed  Covered c sterile gauze/ secured c hypofix

## 2011-03-15 NOTE — Interval H&P Note (Cosign Needed)
History and Physical Interval Note: Note from Dr. Dalene Carrow dated 02/19/11 reviewed with patient.  No new changes with physical exam or ROS since that note.  03/15/2011 9:16 AM  [name]  has presented today for surgery, with the diagnosis of *pancytopenia and retroperitoneal adenopathy on PET scan with history of colon cancer.*  The various methods of treatment have been discussed with the patient and family. After consideration of risks, benefits and other options for treatment, the patient has consented to 1) bone marrow needle core biopsy for pancytopenia evaluation and 2) retroperitoneal lymph node needle core biopsy to evaluate for metastatic disease verses inflammation as a surgical intervention .  The patients' history has been reviewed, patient examined, no change in status, stable for surgery.  I have reviewed the patients' chart and labs.  Questions were answered to the patient's satisfaction.  Written consent obtained.    CAMPBELL,PAMELA D, PA-C

## 2011-03-15 NOTE — H&P (View-Only) (Signed)
CC:   Mina Marble, M.D. Herbie Saxon, MD Everardo All. Madilyn Fireman, M.D. Juanetta Gosling, MD Levert Feinstein, MD  IDENTIFYING STATEMENT:  The patient is a 65 year old woman who presents for followup.  INTERIM HISTORY:  Ms. Mendolia has had several medical concerns.  Mammogram had noted a lobulated mass in the subareolar region of the left breast. This area had increased in size over time thus she underwent ultrasound guided assisted core biopsy and findings revealed extensive stromal fibrosis.  She was referred to Dr. Dwain Sarna for an excisional biopsy. Dr. Dwain Sarna and I talked about her case and we both felt that a decision about biopsy would be made following her staging CT scans.    Ms. Fong recent CT scans of the chest, abdomen and pelvis on 02/16/2011 revealed no new or progressive disease within the chest.  The subpleural lymph nodes on the left remain unchanged.  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 There was a well-circumscribed hypoattenuating area along the falciform ligament, measuring 2.5 x 3.4 cm consistent with a stable ablation defect.  Within the abdomen and pelvis there were enlarging abdominal retroperitoneal nodes suspicious for nodal metastases.    Ms. Linarez other medical problems include history of a right jugular DVT currently anticoagulated with Lovenox.  History of stage IV colon cancer with isolated metastasis to liver status post colectomy and radiofrequency ablation followed by chemotherapy FOLFOX 6 and Avastin for 12 cycles and history of bowel injury followed by small bowel resection with prolonged hospital stay.  She otherwise feels well.  She denies pain.  She denies fever, chills, night sweats.  She has not lost weight.  She is eating.  She is not fatigued.  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  MEDICATIONS:  Reviewed and updated.  ALLERGIES:  None.  Past medical history,  family history, social history unchanged.  REVIEW OF SYSTEMS:  Ten point review of systems negative.  PHYSICAL EXAMINATION:  General:  The patient is a well-appearing, well- nourished woman in no distress.  Vital signs:  Pulse 66, blood pressure 108/62, temperature 98.2, respirations 18, weight 162 pounds.  HEENT: Head is atraumatic, normocephalic.  Sclerae anicteric.  Mouth moist. Chest:  Clear.  Port-A-Cath without signs of infection.  Abdomen:  Soft, nontender.  Bowel sounds present.  Extremities:  No edema.  Pulses present and symmetrical.  LAB DATA:  On 02/16/2011 white cell count 2, hemoglobin 11.1, hematocrit 33.2, platelets 100, ANC 800.  Sodium 142, potassium 4.3, chloride 102, CO2 29, BUN 10, creatinine 0.9, glucose 87.  T bilirubin 0.5, alkaline phosphatase 38.  AST 19, ALT 21, calcium 8.9.  CEA less than 0.5.  IMPRESSION AND PLAN: 1. Ms. Keefe is a 65 year old woman with stage IV colon cancer with     isolated metastases to the liver.  She is status post colectomy as     well as radiofrequency ablation to isolated liver mets on     05/05/2009 at [facility].  She then went on to     receive chemotherapy FOLFOX 6 and Avastin between 06/08/2009     through 12/19/2009.  She required dose reduction including     chemotherapy due to thrombocytopenia and leukopenia.  She also has     a finding of a left breast mass, biopsy consistent with fibrosis.     Her current CT scans noted findings of retroperitoneal lymph nodes.  So with that said we will arrange a PET scan and a CT guided     biopsy. 2. Pancytopenia.  The patient is afebrile with no evidence of     bleeding.  This may be related to chemotherapy but I am hoping that     the patient can have a CT guided biopsy of the bone marrow when she     receives the biopsy of lymph node. 3. History of right jugular vein deep vein thrombosis, anticoagulated     with Lovenox.  The patient follows up  soon.    ______________________________ Laurice Record, M.D. LIO/MEDQ  D:  02/19/2011  T:  02/19/2011  Job:  [id number]

## 2011-03-15 NOTE — Procedures (Signed)
Successful RT iliac BM asp and core bx with CT guidance  Successful Lt RP LN core bx also with CT guidance  No comp Stable Path P

## 2011-03-17 ENCOUNTER — Other Ambulatory Visit: Payer: Self-pay | Admitting: *Deleted

## 2011-03-17 DIAGNOSIS — I8290 Acute embolism and thrombosis of unspecified vein: Secondary | ICD-10-CM

## 2011-03-17 DIAGNOSIS — Z85038 Personal history of other malignant neoplasm of large intestine: Secondary | ICD-10-CM

## 2011-03-17 MED ORDER — ENOXAPARIN SODIUM 150 MG/ML ~~LOC~~ SOLN: 1.5000 mg/kg | Freq: Every day | SUBCUTANEOUS | Status: DC

## 2011-03-18 ENCOUNTER — Other Ambulatory Visit: Payer: Self-pay | Admitting: *Deleted

## 2011-03-19 ENCOUNTER — Telehealth: Payer: Self-pay | Admitting: Hematology and Oncology

## 2011-03-19 ENCOUNTER — Other Ambulatory Visit (HOSPITAL_BASED_OUTPATIENT_CLINIC_OR_DEPARTMENT_OTHER): Payer: BC Managed Care – PPO | Admitting: Lab

## 2011-03-19 ENCOUNTER — Ambulatory Visit (HOSPITAL_BASED_OUTPATIENT_CLINIC_OR_DEPARTMENT_OTHER): Payer: BC Managed Care – PPO | Admitting: Hematology and Oncology

## 2011-03-19 VITALS — BP 153/86 | HR 64 | Temp 98.8°F | Ht 62.0 in | Wt 156.6 lb

## 2011-03-19 DIAGNOSIS — C189 Malignant neoplasm of colon, unspecified: Secondary | ICD-10-CM

## 2011-03-19 DIAGNOSIS — C787 Secondary malignant neoplasm of liver and intrahepatic bile duct: Secondary | ICD-10-CM

## 2011-03-19 DIAGNOSIS — N63 Unspecified lump in unspecified breast: Secondary | ICD-10-CM

## 2011-03-19 DIAGNOSIS — R599 Enlarged lymph nodes, unspecified: Secondary | ICD-10-CM

## 2011-03-19 DIAGNOSIS — I8290 Acute embolism and thrombosis of unspecified vein: Secondary | ICD-10-CM

## 2011-03-19 DIAGNOSIS — Z85038 Personal history of other malignant neoplasm of large intestine: Secondary | ICD-10-CM

## 2011-03-19 DIAGNOSIS — I82C29 Chronic embolism and thrombosis of unspecified internal jugular vein: Secondary | ICD-10-CM

## 2011-03-19 LAB — CBC WITH DIFFERENTIAL/PLATELET
BASO%: 0.3 % (ref 0.0–2.0)
EOS%: 0 % (ref 0.0–7.0)
HCT: 34.7 % — ABNORMAL LOW (ref 34.8–46.6)
LYMPH%: 25 % (ref 14.0–49.7)
MCH: 32 pg (ref 25.1–34.0)
MCHC: 34 g/dL (ref 31.5–36.0)
MCV: 94.2 fL (ref 79.5–101.0)
MONO%: 11.4 % (ref 0.0–14.0)
NEUT%: 63.3 % (ref 38.4–76.8)
Platelets: 108 10*3/uL — ABNORMAL LOW (ref 145–400)
RBC: 3.68 10*6/uL — ABNORMAL LOW (ref 3.70–5.45)

## 2011-03-19 LAB — COMPREHENSIVE METABOLIC PANEL
ALT: 13 U/L (ref 0–35)
AST: 16 U/L (ref 0–37)
Alkaline Phosphatase: 48 U/L (ref 39–117)
CO2: 23 mEq/L (ref 19–32)
Creatinine, Ser: 0.82 mg/dL (ref 0.50–1.10)
Sodium: 136 mEq/L (ref 135–145)
Total Bilirubin: 0.4 mg/dL (ref 0.3–1.2)

## 2011-03-19 LAB — CEA: CEA: <0.5 ng/mL (ref 0.0–5.0)

## 2011-03-19 NOTE — Telephone Encounter (Signed)
Gave pt march 2013 appt for lab ct and f./u md visit.  Called pt with doppler u/s appt for 06/10/2011.

## 2011-03-19 NOTE — Progress Notes (Signed)
CC:   Mina Marble, M.D. Juanetta Gosling, MD  IDENTIFYING STATEMENT:  The patient is a 65 year old woman who presents to discuss results.  INTERIM HISTORY:  To summarize, Ms. Telford' recent CT scan had shown enlarging retroperitoneal lymph nodes that were felt to be suspicious for node metastases.  She has an underlying history of stage IV colon cancer that has been treated in the past.  On 03/15/2011 she underwent a core biopsy of left retroperitoneal node.  Surgical pathology showed scant lymphoplasmacytic tissue associated with fibrosis and not diagnostic of malignancy.  The patient is asymptomatic.  She denies fever chills, or night sweats.  She denies pain.  PAST MEDICAL HISTORY: Unchanged.  FAMILY HISTORY:  Unchanged.  SOCIAL HISTORY:  Unchanged.  REVIEW OF SYSTEMS:  Ten-point review of systems negative.  PHYSICAL EXAMINATION:  General:  Alert and oriented x3.  Vitals:  Pulse 64, blood pressure 150/86, temperature 98.8, respirations 18, weight 156.6 pounds.  HEENT:  Head is atraumatic, normocephalic.  Sclerae anicteric.  Mouth moist.  Chest:  Clear.  Port with no signs of infection.  Abdomen:  Soft, nontender.  Bowel sounds present. Extremities:  No calf tenderness.  No edema.  Lymph Nodes:  None palpable.  CNS:  Nonfocal.  LABORATORY DATA:  03/19/2011 white cell count 2.6, hemoglobin 11.8, hematocrit 34.7, platelets 108, ANC 1600.  CMET, CEA, and LDH pending.  IMPRESSIONS AND PLAN: 1. Ms. Rigdon is a 65 year old woman with stage IV colon cancer with     history of an isolated metastasis to the liver.  She is status post     colectomy, as well as radiofrequency ablation to the isolated liver     mets on 05/05/2009 at [facility].  She then went     on to receive chemotherapy with FOLFOX-6 and Avastin between     06/08/2009 through 12/19/2009.  She required dose reduction due to     thrombocytopenia leukopenia.  She has not had therapy  since. 2. CT scan findings for retroperitoneal adenopathy.  Status post CT-     guided core biopsy on 03/15/2011, revealing no evidence of     malignancy except for fibrosis.  Of note, the patient had undergone     a hysterectomy a few months prior and had a prolonged recovery     course, which was complicated with infection.  She required     antibiotics. 3. Left breast mass, biopsy consistent with fibrosis.  Followed up by     Dr. Dwain Sarna. 4. History of right jugular deep venous thrombosis, currently     anticoagulated with Lovenox.  Ms. Leiner' scans indicate no evidence of malignancy and continual observation with radiological studies is indicated for now.  She follows up in 3 months' time with repeat CT scan.  Will also get a Doppler of her right upper neck area.  She will continue with Lovenox at the current dose.  I spent more than half the time coordinating care.    ______________________________ Laurice Record, M.D. LIO/MEDQ  D:  03/19/2011  T:  03/19/2011  Job:  [id number]

## 2011-03-19 NOTE — Progress Notes (Signed)
This office note has been dictated.

## 2011-03-25 NOTE — Progress Notes (Signed)
[name], Can we get her to do this? MW

## 2011-03-29 ENCOUNTER — Ambulatory Visit (HOSPITAL_BASED_OUTPATIENT_CLINIC_OR_DEPARTMENT_OTHER): Payer: BC Managed Care – PPO

## 2011-03-29 VITALS — BP 132/74 | HR 63 | Temp 98.1°F

## 2011-03-29 DIAGNOSIS — C186 Malignant neoplasm of descending colon: Secondary | ICD-10-CM

## 2011-03-29 DIAGNOSIS — C787 Secondary malignant neoplasm of liver and intrahepatic bile duct: Secondary | ICD-10-CM

## 2011-03-29 DIAGNOSIS — Z85038 Personal history of other malignant neoplasm of large intestine: Secondary | ICD-10-CM

## 2011-03-29 DIAGNOSIS — Z452 Encounter for adjustment and management of vascular access device: Secondary | ICD-10-CM

## 2011-03-29 MED ORDER — SODIUM CHLORIDE 0.9 % IJ SOLN
10.0000 mL | INTRAMUSCULAR | Status: DC | PRN
  Administered 2011-03-29: 10 mL via INTRAVENOUS
  Filled 2011-03-29: qty 10

## 2011-03-29 MED ORDER — HEPARIN SOD (PORK) LOCK FLUSH 100 UNIT/ML IV SOLN
500.0000 [IU] | Freq: Once | INTRAVENOUS | Status: AC | PRN
  Administered 2011-03-29: 500 [IU] via INTRAVENOUS
  Filled 2011-03-29: qty 5

## 2011-03-29 NOTE — Patient Instructions (Signed)
Call MD for problems 

## 2011-04-14 ENCOUNTER — Telehealth (INDEPENDENT_AMBULATORY_CARE_PROVIDER_SITE_OTHER): Payer: Self-pay

## 2011-04-14 NOTE — Telephone Encounter (Signed)
LMOM for pt to call me back to figure out about an appt with Dr Dwain Sarna [facility]

## 2011-05-06 ENCOUNTER — Ambulatory Visit (INDEPENDENT_AMBULATORY_CARE_PROVIDER_SITE_OTHER): Payer: BC Managed Care – PPO | Admitting: General Surgery

## 2011-06-10 ENCOUNTER — Ambulatory Visit (HOSPITAL_COMMUNITY)
Admission: RE | Admit: 2011-06-10 | Discharge: 2011-06-10 | Disposition: A | Discharge status: Home / Self Care | Payer: BC Managed Care – PPO | Source: Ambulatory Visit | Attending: Hematology and Oncology | Admitting: Hematology and Oncology

## 2011-06-10 ENCOUNTER — Ambulatory Visit (HOSPITAL_COMMUNITY): Admission: RE | Admit: 2011-06-10 | Payer: BC Managed Care – PPO | Source: Ambulatory Visit

## 2011-06-10 ENCOUNTER — Other Ambulatory Visit (HOSPITAL_BASED_OUTPATIENT_CLINIC_OR_DEPARTMENT_OTHER): Payer: BC Managed Care – PPO | Admitting: Lab

## 2011-06-10 DIAGNOSIS — I8290 Acute embolism and thrombosis of unspecified vein: Secondary | ICD-10-CM

## 2011-06-10 DIAGNOSIS — Z90710 Acquired absence of both cervix and uterus: Secondary | ICD-10-CM | POA: Insufficient documentation

## 2011-06-10 DIAGNOSIS — R918 Other nonspecific abnormal finding of lung field: Secondary | ICD-10-CM | POA: Insufficient documentation

## 2011-06-10 DIAGNOSIS — I517 Cardiomegaly: Secondary | ICD-10-CM | POA: Insufficient documentation

## 2011-06-10 DIAGNOSIS — D1803 Hemangioma of intra-abdominal structures: Secondary | ICD-10-CM | POA: Insufficient documentation

## 2011-06-10 DIAGNOSIS — I700 Atherosclerosis of aorta: Secondary | ICD-10-CM | POA: Insufficient documentation

## 2011-06-10 DIAGNOSIS — Z9079 Acquired absence of other genital organ(s): Secondary | ICD-10-CM | POA: Insufficient documentation

## 2011-06-10 DIAGNOSIS — C189 Malignant neoplasm of colon, unspecified: Secondary | ICD-10-CM

## 2011-06-10 LAB — CBC WITH DIFFERENTIAL/PLATELET
Basophils Absolute: 0 10*3/uL (ref 0.0–0.1)
Eosinophils Absolute: 0 10*3/uL (ref 0.0–0.5)
HCT: 32.9 % — ABNORMAL LOW (ref 34.8–46.6)
HGB: 11 g/dL — ABNORMAL LOW (ref 11.6–15.9)
MCV: 94.7 fL (ref 79.5–101.0)
MONO%: 7.8 % (ref 0.0–14.0)
NEUT#: 1.7 10*3/uL (ref 1.5–6.5)
NEUT%: 58.7 % (ref 38.4–76.8)
RDW: 12.9 % (ref 11.2–14.5)
lymph#: 0.9 10*3/uL (ref 0.9–3.3)

## 2011-06-10 LAB — CMP (CANCER CENTER ONLY)
ALT(SGPT): 21 U/L (ref 10–47)
Albumin: 3.2 g/dL — ABNORMAL LOW (ref 3.3–5.5)
Alkaline Phosphatase: 53 U/L (ref 26–84)
Glucose, Bld: 88 mg/dL (ref 73–118)
Potassium: 4.4 mEq/L (ref 3.3–4.7)
Sodium: 142 mEq/L (ref 128–145)
Total Protein: 7.5 g/dL (ref 6.4–8.1)

## 2011-06-10 LAB — LACTATE DEHYDROGENASE: LDH: 162 U/L (ref 94–250)

## 2011-06-10 MED ORDER — IOHEXOL 300 MG/ML  SOLN
100.0000 mL | Freq: Once | INTRAMUSCULAR | Status: AC | PRN
  Administered 2011-06-10: 100 mL via INTRAVENOUS

## 2011-06-16 ENCOUNTER — Encounter: Payer: Self-pay | Admitting: *Deleted

## 2011-06-16 NOTE — Progress Notes (Signed)
Lovenox refill request received from [facility] on [employer].  Request to MD for review.  Patient next f/u appt. Is 06-18-11.

## 2011-06-17 ENCOUNTER — Telehealth: Payer: Self-pay | Admitting: *Deleted

## 2011-06-17 ENCOUNTER — Other Ambulatory Visit: Payer: Self-pay | Admitting: *Deleted

## 2011-06-17 ENCOUNTER — Ambulatory Visit (HOSPITAL_COMMUNITY): Admission: RE | Admit: 2011-06-17 | Payer: BC Managed Care – PPO | Source: Ambulatory Visit

## 2011-06-17 DIAGNOSIS — C189 Malignant neoplasm of colon, unspecified: Secondary | ICD-10-CM

## 2011-06-17 MED ORDER — ENOXAPARIN SODIUM 120 MG/0.8ML ~~LOC~~ SOLN: 130.0000 mg | SUBCUTANEOUS | Status: DC

## 2011-06-17 NOTE — Telephone Encounter (Signed)
Spoke with pt and was informed that pt was not aware of doppler study scheduled for 06/10/11  After CT scans appt.    Informed pt that doppler was rescheduled for  06/18/11  At  1 pm at  [facility]  -  Since pt could not come today.   Instructed to take Lovenox injection today ;  Hold injection on 3/15 until after pt sees md  For further instructions.    Pt voiced understanding.

## 2011-06-18 ENCOUNTER — Telehealth: Payer: Self-pay | Admitting: Hematology and Oncology

## 2011-06-18 ENCOUNTER — Ambulatory Visit (HOSPITAL_COMMUNITY)
Admission: RE | Admit: 2011-06-18 | Discharge: 2011-06-18 | Disposition: A | Discharge status: Home / Self Care | Payer: BC Managed Care – PPO | Source: Ambulatory Visit | Attending: Hematology and Oncology | Admitting: Hematology and Oncology

## 2011-06-18 ENCOUNTER — Other Ambulatory Visit: Payer: Self-pay | Admitting: Nurse Practitioner

## 2011-06-18 ENCOUNTER — Encounter: Payer: Self-pay | Admitting: Hematology and Oncology

## 2011-06-18 ENCOUNTER — Ambulatory Visit (HOSPITAL_BASED_OUTPATIENT_CLINIC_OR_DEPARTMENT_OTHER): Payer: BC Managed Care – PPO | Admitting: Hematology and Oncology

## 2011-06-18 VITALS — BP 135/72 | HR 64 | Temp 97.9°F | Ht 62.0 in | Wt 168.0 lb

## 2011-06-18 DIAGNOSIS — I8290 Acute embolism and thrombosis of unspecified vein: Secondary | ICD-10-CM

## 2011-06-18 DIAGNOSIS — Z9221 Personal history of antineoplastic chemotherapy: Secondary | ICD-10-CM

## 2011-06-18 DIAGNOSIS — I82B29 Chronic embolism and thrombosis of unspecified subclavian vein: Secondary | ICD-10-CM | POA: Insufficient documentation

## 2011-06-18 DIAGNOSIS — Z85038 Personal history of other malignant neoplasm of large intestine: Secondary | ICD-10-CM

## 2011-06-18 DIAGNOSIS — M7989 Other specified soft tissue disorders: Secondary | ICD-10-CM | POA: Insufficient documentation

## 2011-06-18 DIAGNOSIS — C189 Malignant neoplasm of colon, unspecified: Secondary | ICD-10-CM

## 2011-06-18 DIAGNOSIS — I82C29 Chronic embolism and thrombosis of unspecified internal jugular vein: Secondary | ICD-10-CM | POA: Insufficient documentation

## 2011-06-18 DIAGNOSIS — I82890 Acute embolism and thrombosis of other specified veins: Secondary | ICD-10-CM

## 2011-06-18 NOTE — Telephone Encounter (Signed)
Pt sent for doppler and given appt schedule for sept.

## 2011-06-18 NOTE — Progress Notes (Signed)
CC:   Mina Marble, M.D. Juanetta Gosling, MD  IDENTIFYING STATEMENT:  The patient is a 66 year old woman with colon cancer who presents discuss results.  INTERVAL HISTORY:  Ms. Tondreau was seen 3 months ago.  Reports no issues or concerns since her last followup visit.  Has no nausea, vomiting, abdominal pain.  Has had no change in bowel function.  Denies rectal bleeding.  Her weight has been stable.  She continues on Lovenox with very minimal complaints.  Reviewed recent CT scan of the chest, abdomen and pelvis on 06/10/2011. The chest showed numerous prominent but non pathologically enlarged bilateral axillary lymph nodes, similar to a prior exam and was felt to be nonspecific and there were no aggressive appearing lytic or blastic lesions.  There were several lung nodules which had not changed in size. There were no new enlarging suspicious appearing pulmonary nodules or masses.  There was no thoracic adenopathy.  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  There was no pelvic adenopathy.  There were multiple prominent bilateral inguinal lymph nodes similar to prior exam also felt to be nonspecific.  In summary, there were no findings to currently suggest the presence of metastatic disease in the abdomen or pelvis.  MEDICATIONS:  Reviewed and updated.  Past medical history, family history and social history unchanged.  REVIEW OF SYSTEMS:  Ten point review of systems negative.  PHYSICAL EXAMINATION:  General:  The patient is a well-appearing, well- nourished woman in no distress.  Vitals:  Pulse 64, blood pressure 135/72, temperature 97.9, respirations 20, weight 168 pounds.  HEENT: Head is atraumatic, normocephalic.  Sclerae anicteric.  Mouth moist. Chest:  Clear.  CVS:  Unremarkable.  Abdomen:  Soft,  nontender.  Bowel sounds present.  Extremities:  No calf tenderness or edema.  Pulses present and symmetrical.  Lymph nodes:  No adenopathy.  CNS:  Nonfocal.  LAB DATA:  On 06/20/2011 white cell count 2.9, hemoglobin 11, hematocrit 32.9, platelets 142, ANC 1700.  Sodium 142, potassium 4.4, chloride 96, CO2 29, BUN 12, creatinine 0.7, glucose 88, T bilirubin 0.5, alkaline phosphatase 53, AST 23, ALT 21, calcium 8.8.  CEA less than 0.5. Results of CT as in interval history.  IMPRESSION: 1. Ms. Tempesta is a 66 year old woman with stage IV colon cancer with     history of an isolated metastasis to the liver.  Status post     colectomy as well as radiofrequency ablation to the isolated liver     met on 05/05/2009 at [facility].  She then went     on to receive chemotherapy FOLFOX6 and Avastin between 06/08/2009     and 12/19/2009 requiring dose reduction due to thrombocytopenia and     leukopenia.  Her current CT and exam indicates no evidence of     recurrent disease. 2. CT scan findings for retroperitoneal and anterior adenopathy,     status post CT guided core biopsy on 03/15/2011 revealing no     evidence of malignancy except for fibrosis. 3. 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 4. 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  Ms. Sieh will       Have a Doppler and if there are no new findings, we will discontinue Lovenox.  Ms. Dunkel follows up in  6 months' time with labs.    ______________________________ Laurice Record, M.D. LIO/MEDQ  D:  06/18/2011  T:  06/18/2011  Job:  [id number]

## 2011-06-18 NOTE — Progress Notes (Signed)
VASCULAR LAB PRELIMINARY  PRELIMINARY  PRELIMINARY  PRELIMINARY Bilateral upper extremity venous duplex completed.    Preliminary report:  Right - No evidence of acute DVT in the right  upper extremity. 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 No DVT noted in the left subclavian vein.  Sahara Fujimoto D, RVS 06/18/2011, 2:40 PM

## 2011-06-18 NOTE — Progress Notes (Signed)
This office note has been dictated.

## 2011-06-18 NOTE — Patient Instructions (Signed)
Patient to follow up as instructed.   Current Outpatient Prescriptions  Medication Sig Dispense Refill  . albuterol (PROVENTIL HFA;VENTOLIN HFA) 108 (90 BASE) MCG/ACT inhaler Inhale 2 puffs into the lungs every 6 (six) hours as needed. WHEEZING        . cloNIDine (CATAPRES) 0.1 MG tablet Take 0.1 mg by mouth 2 (two) times daily.        Marland Kitchen diltiazem (CARDIZEM CD) 180 MG 24 hr capsule Take 180 mg by mouth every morning.       . enoxaparin (LOVENOX) 120 MG/0.8ML SOLN Inject 0.87 mLs (130 mg total) into the skin daily.  30 Syringe  1  . ferrous gluconate (FERGON) 325 MG tablet Take 325 mg by mouth 3 (three) times daily with meals.        . Fluticasone-Salmeterol (ADVAIR) 100-50 MCG/DOSE AEPB Inhale 2 puffs into the lungs 2 (two) times daily as needed. WHEEZING       . ibuprofen (ADVIL,MOTRIN) 400 MG tablet Take 400 mg by mouth every 8 (eight) hours as needed. PAIN       . labetalol (NORMODYNE) 100 MG tablet Take 100 mg by mouth every morning.       . levETIRAcetam (KEPPRA) 250 MG tablet Take 250 mg by mouth every 12 (twelve) hours.        . lidocaine-prilocaine (EMLA) cream Apply 1 application topically as needed. USE WHEN GOING INTO PORT      . Multiple Vitamin (MULTIVITAMIN) tablet Take 1 tablet by mouth daily.        . nebivolol (BYSTOLIC) 10 MG tablet Take 10 mg by mouth every morning.       . olmesartan-hydrochlorothiazide (BENICAR HCT) 40-25 MG per tablet Take 1 tablet by mouth every morning.       Marland Kitchen oxyCODONE-acetaminophen (PERCOCET) 5-325 MG per tablet Take 1 tablet by mouth every 4 (four) hours as needed. Take 1-2 tablets Q 4-6 hours PRN severe pain       . potassium chloride SA (K-DUR,KLOR-CON) 20 MEQ tablet Take 20 mEq by mouth 3 (three) times daily.        . temazepam (RESTORIL) 15 MG capsule Take 15 mg by mouth at bedtime as needed. SLEEP             March 2013  Sunday Monday Tuesday Wednesday Thursday Friday Saturday                  1   2    3   4   5   6   7    LAB MO    11:45 AM  (15 min.)  Sherrie Mustache  [facility] MEDICAL ONCOLOGY   CT BODY W/  12:30 PM  (30 min.)  Wl-Ct 1  [facility]   VENOUS UE   2:00 PM  (30 min.)  [facility] Lab Rm   [facility] 8   9    10   11   12   13   14   15    EST PT 30  12:00 PM  (30 min.)  Laurice Record, MD  [facility] MEDICAL [facility]COLOGY   VENOUS UE   1:00 PM  (30 min.)  W[facility] Lab Rm  [facility] 16    17   18   19   20   21   22   23    24    25  26   27   RETURN GYN   1:30 PM  (15 min.)  Purcell Nails, MD  [facility] Obstetrics & Gynecology 28   29   30     31

## 2011-06-18 NOTE — Progress Notes (Signed)
Spoke with patient.  Instructed per Dr. Dalene Carrow- Venous doppler showed only chronic dvt's.  Pt may stop all anticoagulants.  Pt verbalized understanding.

## 2011-06-22 ENCOUNTER — Other Ambulatory Visit: Payer: Self-pay | Admitting: Hematology and Oncology

## 2011-06-22 ENCOUNTER — Other Ambulatory Visit: Payer: Self-pay | Admitting: *Deleted

## 2011-06-22 DIAGNOSIS — C189 Malignant neoplasm of colon, unspecified: Secondary | ICD-10-CM

## 2011-06-25 ENCOUNTER — Telehealth: Payer: Self-pay | Admitting: Hematology and Oncology

## 2011-06-25 NOTE — Telephone Encounter (Signed)
S/w Tina in IR today. Pt alread scheduled for port removal and contacted by Inetta Fermo.

## 2011-06-28 ENCOUNTER — Other Ambulatory Visit: Payer: Self-pay | Admitting: Physician Assistant

## 2011-06-30 ENCOUNTER — Encounter (INDEPENDENT_AMBULATORY_CARE_PROVIDER_SITE_OTHER): Payer: BC Managed Care – PPO | Admitting: Obstetrics and Gynecology

## 2011-06-30 DIAGNOSIS — Z01419 Encounter for gynecological examination (general) (routine) without abnormal findings: Secondary | ICD-10-CM

## 2011-07-01 ENCOUNTER — Ambulatory Visit (HOSPITAL_COMMUNITY)
Admission: RE | Admit: 2011-07-01 | Discharge: 2011-07-01 | Disposition: A | Discharge status: Home / Self Care | Payer: BC Managed Care – PPO | Source: Ambulatory Visit | Attending: Hematology and Oncology | Admitting: Hematology and Oncology

## 2011-07-01 DIAGNOSIS — Z452 Encounter for adjustment and management of vascular access device: Secondary | ICD-10-CM | POA: Insufficient documentation

## 2011-07-01 DIAGNOSIS — C189 Malignant neoplasm of colon, unspecified: Secondary | ICD-10-CM | POA: Insufficient documentation

## 2011-07-01 MED ORDER — SODIUM CHLORIDE 0.9 % IV SOLN
INTRAVENOUS | Status: DC
  Administered 2011-07-01: 11:00:00 via INTRAVENOUS

## 2011-07-01 MED ORDER — CEFAZOLIN SODIUM 1-5 GM-% IV SOLN
INTRAVENOUS | Status: AC
  Administered 2011-07-01: 1 g via INTRAVENOUS
  Filled 2011-07-01: qty 50

## 2011-07-01 MED ORDER — CEFAZOLIN SODIUM 1-5 GM-% IV SOLN
1.0000 g | Freq: Once | INTRAVENOUS | Status: AC
  Administered 2011-07-01: 1 g via INTRAVENOUS

## 2011-07-01 NOTE — Procedures (Signed)
Successful removal of right anterior chest wall port-a-cath. No immediate post procedural complications.  

## 2011-12-20 ENCOUNTER — Other Ambulatory Visit (HOSPITAL_COMMUNITY): Payer: Self-pay | Admitting: Pulmonary Disease

## 2011-12-20 DIAGNOSIS — R519 Headache, unspecified: Secondary | ICD-10-CM

## 2011-12-20 DIAGNOSIS — R42 Dizziness and giddiness: Secondary | ICD-10-CM

## 2011-12-22 ENCOUNTER — Other Ambulatory Visit (HOSPITAL_BASED_OUTPATIENT_CLINIC_OR_DEPARTMENT_OTHER): Payer: Medicare Other | Admitting: Lab

## 2011-12-22 DIAGNOSIS — C189 Malignant neoplasm of colon, unspecified: Secondary | ICD-10-CM

## 2011-12-22 DIAGNOSIS — I82890 Acute embolism and thrombosis of other specified veins: Secondary | ICD-10-CM

## 2011-12-22 LAB — COMPREHENSIVE METABOLIC PANEL (CC13)
ALT: 16 U/L (ref 0–55)
AST: 18 U/L (ref 5–34)
Albumin: 3.6 g/dL (ref 3.5–5.0)
Alkaline Phosphatase: 66 U/L (ref 40–150)
Glucose: 98 mg/dl (ref 70–99)
Potassium: 3.9 mEq/L (ref 3.5–5.1)
Sodium: 140 mEq/L (ref 136–145)
Total Protein: 7.1 g/dL (ref 6.4–8.3)

## 2011-12-22 LAB — CBC WITH DIFFERENTIAL/PLATELET
Basophils Absolute: 0 10*3/uL (ref 0.0–0.1)
EOS%: 0.4 % (ref 0.0–7.0)
Eosinophils Absolute: 0 10*3/uL (ref 0.0–0.5)
HGB: 12.4 g/dL (ref 11.6–15.9)
MCH: 31.5 pg (ref 25.1–34.0)
NEUT#: 1 10*3/uL — ABNORMAL LOW (ref 1.5–6.5)
RDW: 12.1 % (ref 11.2–14.5)
lymph#: 0.9 10*3/uL (ref 0.9–3.3)

## 2011-12-23 ENCOUNTER — Ambulatory Visit (HOSPITAL_COMMUNITY)
Admission: RE | Admit: 2011-12-23 | Discharge: 2011-12-23 | Disposition: A | Discharge status: Home / Self Care | Payer: Medicare Other | Source: Ambulatory Visit | Attending: Pulmonary Disease | Admitting: Pulmonary Disease

## 2011-12-23 DIAGNOSIS — R51 Headache: Secondary | ICD-10-CM | POA: Insufficient documentation

## 2011-12-23 DIAGNOSIS — R42 Dizziness and giddiness: Secondary | ICD-10-CM

## 2011-12-23 DIAGNOSIS — R519 Headache, unspecified: Secondary | ICD-10-CM

## 2011-12-23 DIAGNOSIS — C189 Malignant neoplasm of colon, unspecified: Secondary | ICD-10-CM | POA: Insufficient documentation

## 2011-12-23 MED ORDER — IOHEXOL 300 MG/ML  SOLN
75.0000 mL | Freq: Once | INTRAMUSCULAR | Status: AC | PRN
  Administered 2011-12-23: 75 mL via INTRAVENOUS

## 2011-12-24 ENCOUNTER — Ambulatory Visit (HOSPITAL_BASED_OUTPATIENT_CLINIC_OR_DEPARTMENT_OTHER): Payer: Medicare Other | Admitting: Hematology and Oncology

## 2011-12-24 ENCOUNTER — Telehealth: Payer: Self-pay | Admitting: Hematology and Oncology

## 2011-12-24 ENCOUNTER — Encounter: Payer: Self-pay | Admitting: Hematology and Oncology

## 2011-12-24 VITALS — BP 172/90 | HR 69 | Temp 98.4°F | Resp 20 | Ht 62.0 in | Wt 179.5 lb

## 2011-12-24 DIAGNOSIS — C787 Secondary malignant neoplasm of liver and intrahepatic bile duct: Secondary | ICD-10-CM

## 2011-12-24 DIAGNOSIS — C189 Malignant neoplasm of colon, unspecified: Secondary | ICD-10-CM

## 2011-12-24 DIAGNOSIS — Z86718 Personal history of other venous thrombosis and embolism: Secondary | ICD-10-CM

## 2011-12-24 NOTE — Progress Notes (Signed)
This office note has been dictated.

## 2011-12-24 NOTE — Telephone Encounter (Signed)
gve the pt her march 2014 appt calendar along with the ct scan appt

## 2011-12-24 NOTE — Patient Instructions (Addendum)
[name]  [id number]   [facility] - AFTER VISIT SUMMARY   **RECOMMENDATIONS MADE BY THE CONSULTANT AND ANY TEST    RESULTS WILL BE SENT TO YOUR REFERRING DOCTORS.   YOUR EXAM FINDINGS, LABS AND RESULTS WERE DISCUSSED BY YOUR MD TODAY.  YOU CAN GO TO THE [facility] WEB SITE FOR INSTRUCTIONS ON HOW TO ASSESS MY CHART FOR ADDITIONAL INFORMATION AS NEEDED.  Your Updated drug allergies are: Allergies as of 12/24/2011  . (No Known Allergies)    Your current list of medications are: Current Outpatient Prescriptions  Medication Sig Dispense Refill  . aspirin 81 MG tablet Take 81 mg by mouth daily.      . cloNIDine (CATAPRES) 0.1 MG tablet Take 0.1 mg by mouth 2 (two) times daily.        Marland Kitchen diltiazem (CARDIZEM CD) 180 MG 24 hr capsule Take 180 mg by mouth every morning.       . ferrous gluconate (FERGON) 325 MG tablet Take 325 mg by mouth 3 (three) times daily with meals.        Marland Kitchen labetalol (NORMODYNE) 100 MG tablet Take 200 mg by mouth 2 (two) times daily.       Marland Kitchen levETIRAcetam (KEPPRA) 250 MG tablet Take 250 mg by mouth every 12 (twelve) hours.        . Multiple Vitamin (MULTIVITAMIN) tablet Take 1 tablet by mouth daily.        . nebivolol (BYSTOLIC) 10 MG tablet Take 10 mg by mouth daily as needed. Take if  SBP > 145  .  Take at  Bedtime.      Marland Kitchen olmesartan-hydrochlorothiazide (BENICAR HCT) 40-25 MG per tablet Take 1 tablet by mouth every morning.       . potassium chloride SA (K-DUR,KLOR-CON) 20 MEQ tablet Take 20 mEq by mouth 3 (three) times daily.        . temazepam (RESTORIL) 15 MG capsule Take 15 mg by mouth at bedtime as needed. SLEEP       . albuterol (PROVENTIL HFA;VENTOLIN HFA) 108 (90 BASE) MCG/ACT inhaler Inhale 2 puffs into the lungs every 6 (six) hours as needed. WHEEZING        . Fluticasone-Salmeterol (ADVAIR) 100-50 MCG/DOSE AEPB Inhale 2 puffs into the lungs 2 (two) times daily as needed. WHEEZING       . ibuprofen (ADVIL,MOTRIN) 400 MG tablet  Take 400 mg by mouth every 8 (eight) hours as needed. PAIN          INSTRUCTIONS GIVEN AND DISCUSSED:  See attached schedule   SPECIAL INSTRUCTIONS/FOLLOW-UP:  See above.  I acknowledge that I have been informed and understand all the instructions given to me and received a copy.I know to contact the clinic, my physician, or go to the emergency Department if any problems should occur.   I do not have any more questions at this time, but understand that I may call the [facility] at [phone] during business hours should I have any further questions or need assistance in obtaining follow-up care.

## 2011-12-24 NOTE — Progress Notes (Signed)
CC:   Evelyn Gosling, MD Mina Marble, M.D.  IDENTIFYING STATEMENT:  The patient is a 66 year old woman with colon cancer who presents for followup.  INTERVAL HISTORY:  [name] reports being seen in the ED with headaches and dizziness.  She had a head CT which was unremarkable.  She is working with Dr. Petra Kuba to adjustment her blood pressure medications.  Otherwise she feels well.  She is now retired.  Her energy levels are fair.  She has no nausea, vomiting or abdominal pain.  She has had no changes of bowel function.  MEDICATIONS:  Reviewed and updated.  PHYSICAL EXAM:  General:  The patient is alert and oriented times 3. Vitals:  Pulse 69, blood pressure 172/90, temperature 98.4, respirations 20, weight 159 pounds.  HEENT:  Head is atraumatic, normocephalic. Sclerae anicteric.  Mouth moist.  Neck:  Supple.  Chest:  Clear to percussion and auscultation.  CVS:  First and second heart sounds present.  No added sounds or murmurs.  Abdomen:  Soft, nontender.  Bowel sounds present.  Extremities:  No edema.  LAB DATA:  On 12/22/2011 white cell count 2.3, hemoglobin 12.4, hematocrit 37.4, platelets 140 (142).  Sodium 140, potassium 3.9, chloride 106, CO2 25, BUN 12, creatinine 0.9, glucose 98, T bilirubin 0.5, alkaline phosphatase 66, AST 18, ALT 16, CEA less than 0.5. Peripheral smear - unremarkable.  IMPRESSION AND PLAN: 1. [name] is a 66 year old woman with stage IV colon cancer with a     history of an isolated metastasis to the liver.  Status post     colectomy as well as radiofrequency ablation to an isolated liver     met on 05/05/2009 at [facility].  Status post     chemotherapy with FOLFOX 6 and Avastin from 06/08/2009 through     12/19/2009 requiring dose reduction for thrombocytopenia and     leukopenia.  Her lab and exam remained stable.  Continues to     demonstrate persistent mild pancytopenia, however, morphology is  unremarkable.  We will continue to observe. 2. CT scan findings were retroperitoneal and anterior adenopathy     status post CT guided core biopsy on 03/15/2011 showed no evidence     of malignancy except for fibrosis. 3. History of left breast mass with biopsy consistent with fibrosis. 4. History of right jugular deep vein thrombosis status post     Lovenox therapy. 5. The patient follows up in 6 months' time with CT scans and blood     work.    ______________________________ Laurice Record, M.D. LIO/MEDQ  D:  12/24/2011  T:  12/24/2011  Job:  [id number]

## 2012-04-26 ENCOUNTER — Telehealth: Payer: Self-pay | Admitting: Oncology

## 2012-04-26 NOTE — Telephone Encounter (Signed)
Former pt of LO on schedule w/[facility] for 3/19. Due to [facility] out of office appt being r/s. Pt being reassigned to [facility] and was moved to his schedule 3/19. lmonvm for pt confirming lb/ct for 3/17 and asking that she call me directly re changes being made to 3/19 appt.

## 2012-05-25 ENCOUNTER — Telehealth: Payer: Self-pay | Admitting: Oncology

## 2012-05-25 ENCOUNTER — Encounter: Payer: Self-pay | Admitting: Oncology

## 2012-06-10 IMAGING — CR DG CHEST 2V
3 series · 3 of 3 positions shown · non-contrast
Comparison: November 26, 2009

CLINICAL DATA: History of colon cancer; uterine prolapse with
cystocele and rectocele; wheezing

CHEST - 2 VIEW

[view not recorded (1 of 3)]
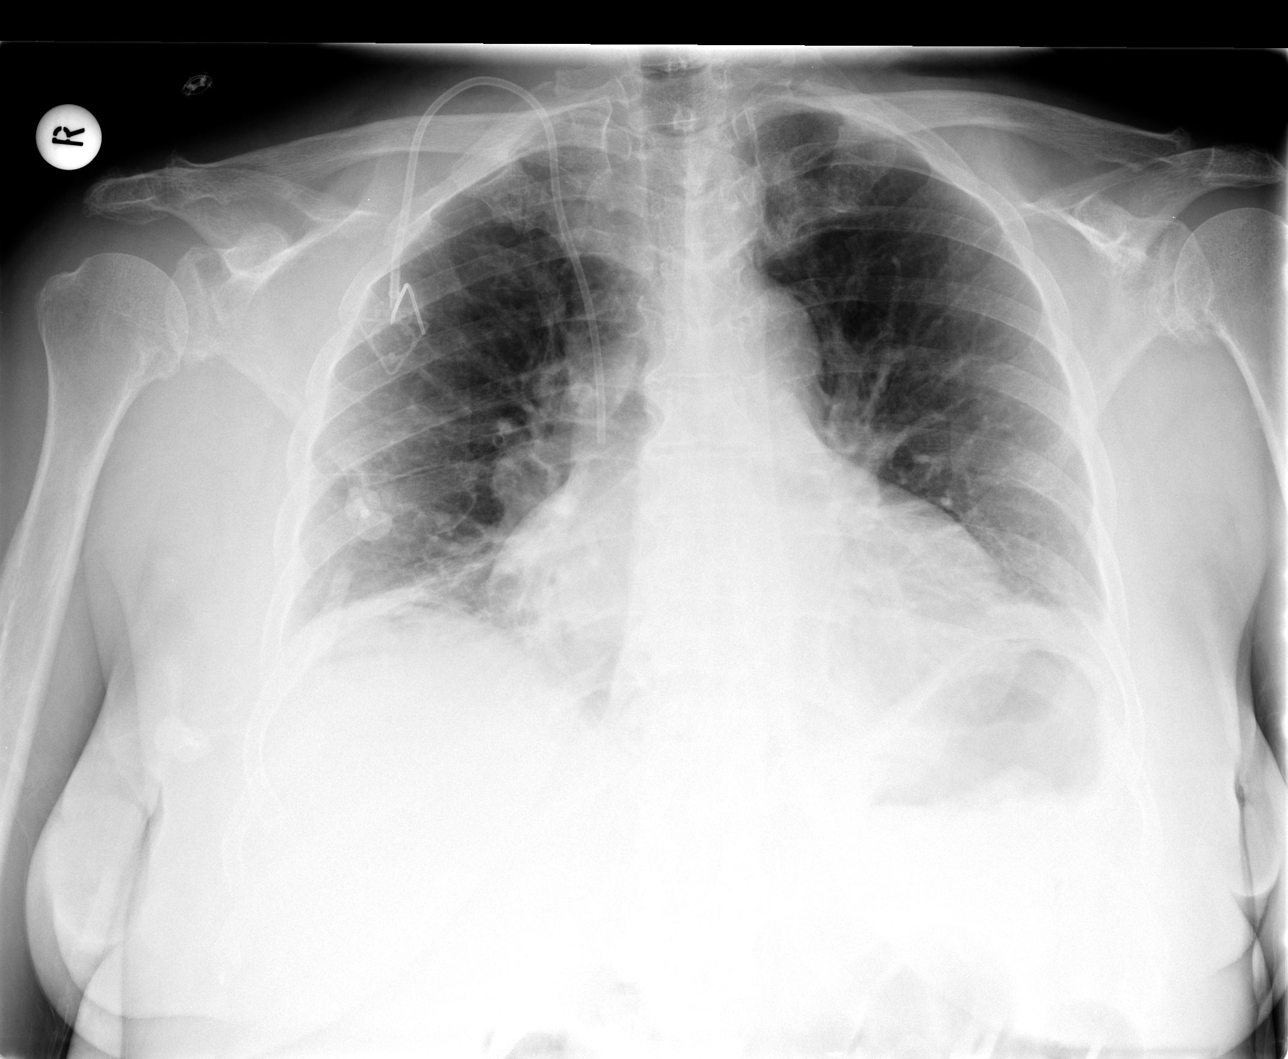

[view not recorded (2 of 3)]
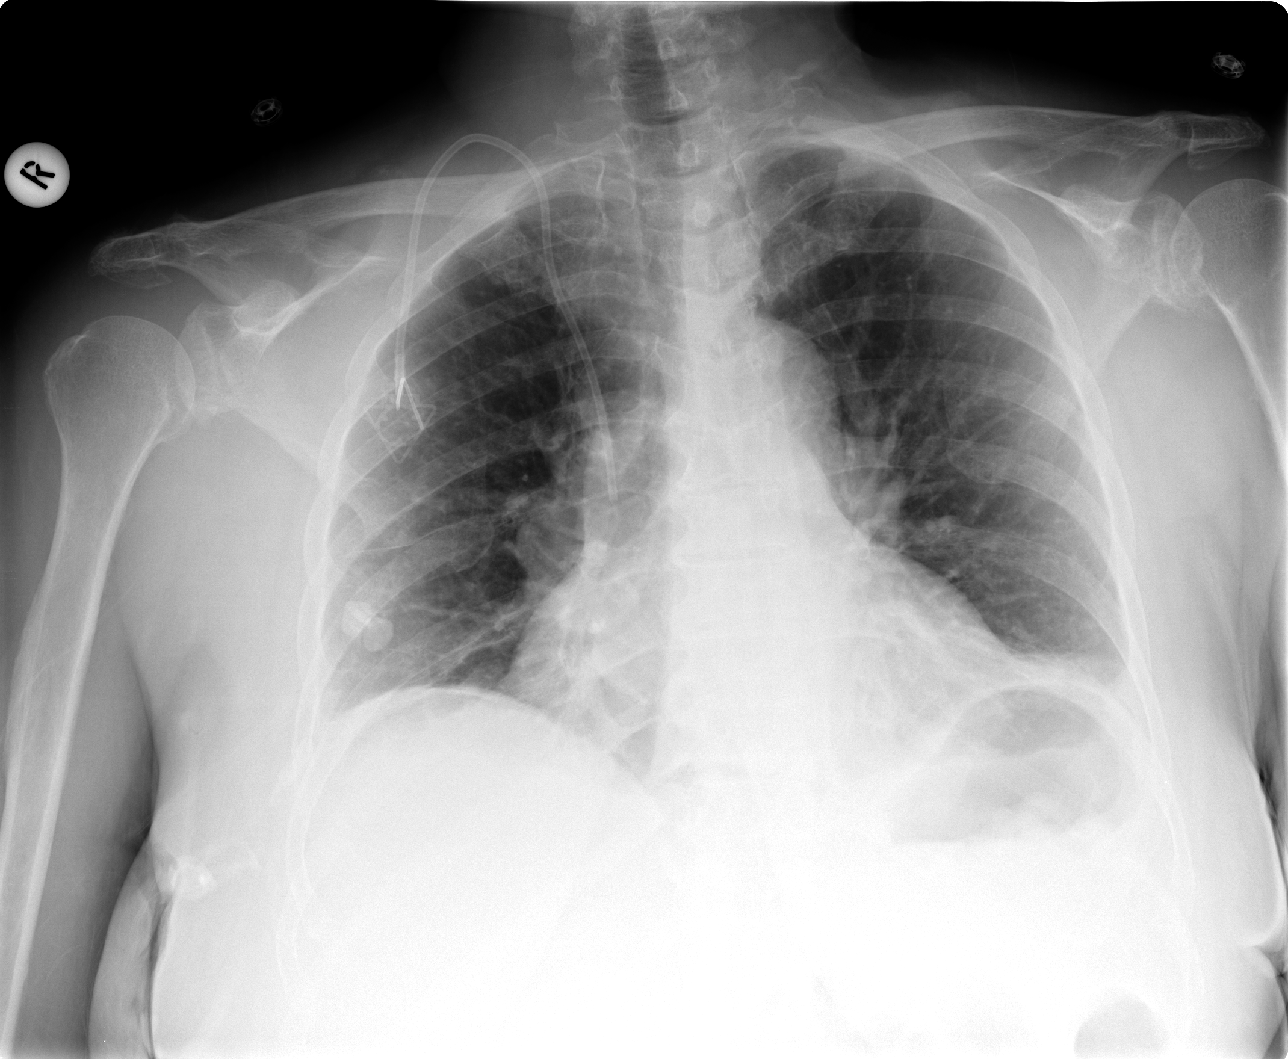

[view not recorded (3 of 3)]
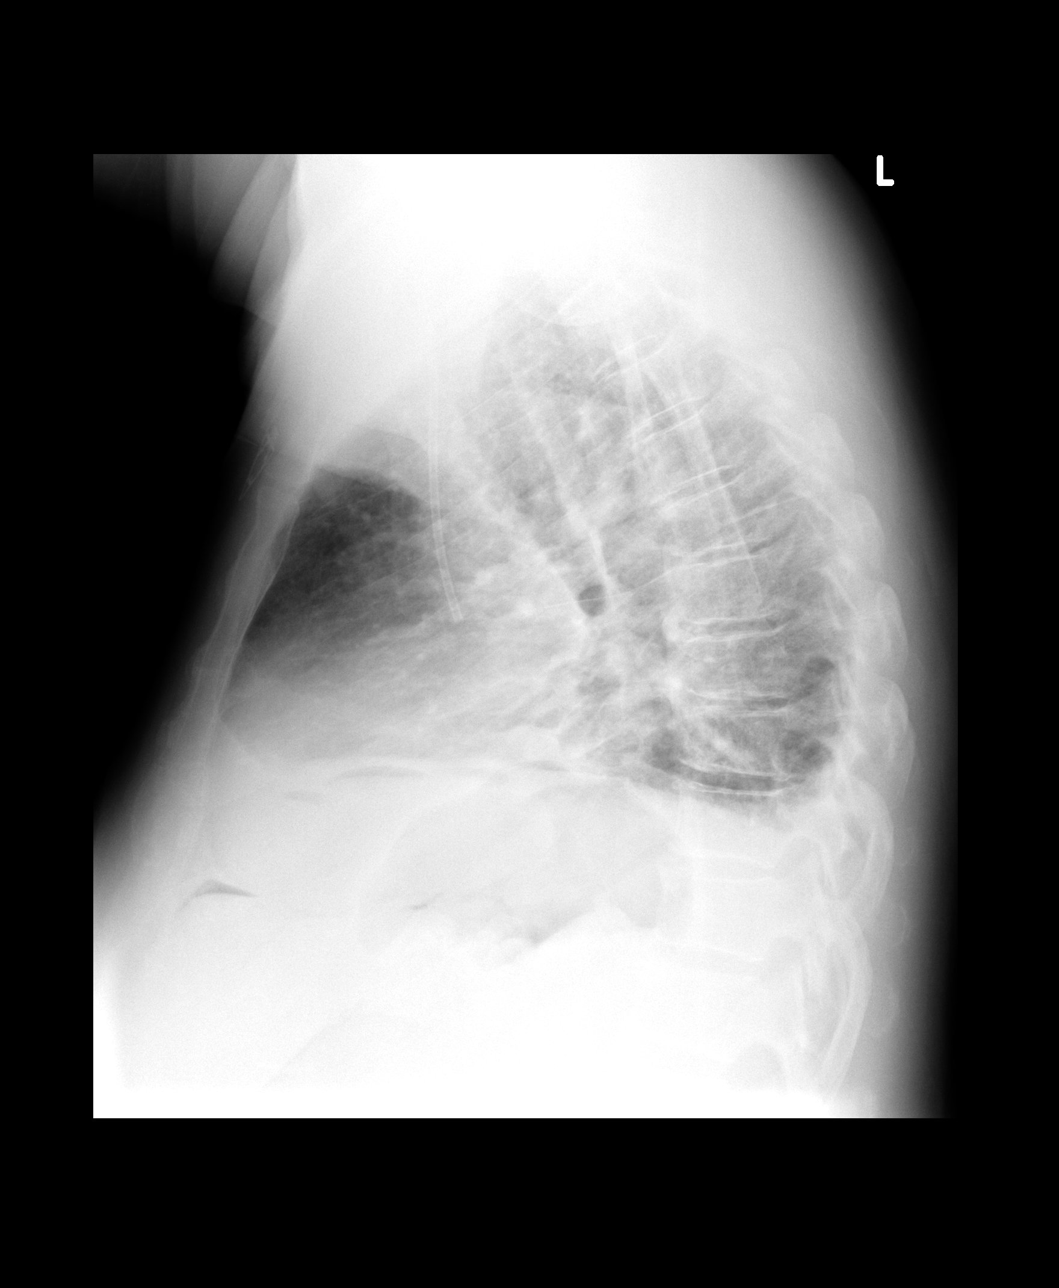

[3 of 3 positions shown; findings below may reference images not displayed]

FINDINGS: The Port-A-Cath tip is stable at the cavoatrial junction.
Linear and patchy opacity is present at both lung bases, more so on
the left than the right.  No gross pleural effusions are
identified.  Mild cardiomegaly is stable.  The mediastinum and the
pulmonary vasculature are within normal limits.
IMPRESSION: Patchy and linear bibasilar opacities, left greater than right,
could represent developing infiltrates versus atelectasis.

## 2012-06-19 ENCOUNTER — Ambulatory Visit (HOSPITAL_COMMUNITY)
Admission: RE | Admit: 2012-06-19 | Discharge: 2012-06-19 | Disposition: A | Discharge status: Home / Self Care | Payer: Medicare Other | Source: Ambulatory Visit | Attending: Hematology and Oncology | Admitting: Hematology and Oncology

## 2012-06-19 ENCOUNTER — Encounter (HOSPITAL_COMMUNITY): Payer: Self-pay

## 2012-06-19 ENCOUNTER — Other Ambulatory Visit (HOSPITAL_BASED_OUTPATIENT_CLINIC_OR_DEPARTMENT_OTHER): Payer: Medicare Other | Admitting: Lab

## 2012-06-19 DIAGNOSIS — I517 Cardiomegaly: Secondary | ICD-10-CM | POA: Insufficient documentation

## 2012-06-19 DIAGNOSIS — R911 Solitary pulmonary nodule: Secondary | ICD-10-CM | POA: Insufficient documentation

## 2012-06-19 DIAGNOSIS — K7689 Other specified diseases of liver: Secondary | ICD-10-CM | POA: Insufficient documentation

## 2012-06-19 DIAGNOSIS — D1803 Hemangioma of intra-abdominal structures: Secondary | ICD-10-CM | POA: Insufficient documentation

## 2012-06-19 DIAGNOSIS — Z90710 Acquired absence of both cervix and uterus: Secondary | ICD-10-CM | POA: Insufficient documentation

## 2012-06-19 DIAGNOSIS — C189 Malignant neoplasm of colon, unspecified: Secondary | ICD-10-CM | POA: Insufficient documentation

## 2012-06-19 DIAGNOSIS — I2510 Atherosclerotic heart disease of native coronary artery without angina pectoris: Secondary | ICD-10-CM | POA: Insufficient documentation

## 2012-06-19 LAB — CBC WITH DIFFERENTIAL/PLATELET
BASO%: 0.7 % (ref 0.0–2.0)
Basophils Absolute: 0 10*3/uL (ref 0.0–0.1)
EOS%: 2.5 % (ref 0.0–7.0)
HGB: 12.6 g/dL (ref 11.6–15.9)
MCH: 31.9 pg (ref 25.1–34.0)
MONO#: 0.3 10*3/uL (ref 0.1–0.9)
NEUT#: 1.5 10*3/uL (ref 1.5–6.5)
RDW: 12.7 % (ref 11.2–14.5)
WBC: 2.7 10*3/uL — ABNORMAL LOW (ref 3.9–10.3)
lymph#: 0.8 10*3/uL — ABNORMAL LOW (ref 0.9–3.3)

## 2012-06-19 LAB — COMPREHENSIVE METABOLIC PANEL (CC13)
ALT: 20 U/L (ref 0–55)
AST: 26 U/L (ref 5–34)
Albumin: 3.3 g/dL — ABNORMAL LOW (ref 3.5–5.0)
BUN: 14.5 mg/dL (ref 7.0–26.0)
Calcium: 9.3 mg/dL (ref 8.4–10.4)
Chloride: 106 mEq/L (ref 98–107)
Potassium: 4.1 mEq/L (ref 3.5–5.1)

## 2012-06-19 MED ORDER — IOHEXOL 300 MG/ML  SOLN
100.0000 mL | Freq: Once | INTRAMUSCULAR | Status: AC | PRN
  Administered 2012-06-19: 100 mL via INTRAVENOUS

## 2012-06-21 ENCOUNTER — Ambulatory Visit: Payer: Medicare Other | Admitting: Family

## 2012-06-21 ENCOUNTER — Ambulatory Visit (HOSPITAL_BASED_OUTPATIENT_CLINIC_OR_DEPARTMENT_OTHER): Payer: Medicare Other | Admitting: Oncology

## 2012-06-21 ENCOUNTER — Telehealth: Payer: Self-pay | Admitting: Oncology

## 2012-06-21 VITALS — BP 150/77 | HR 65 | Temp 97.8°F | Resp 20 | Ht 62.0 in | Wt 195.6 lb

## 2012-06-21 DIAGNOSIS — Z85038 Personal history of other malignant neoplasm of large intestine: Secondary | ICD-10-CM

## 2012-06-21 DIAGNOSIS — Z86718 Personal history of other venous thrombosis and embolism: Secondary | ICD-10-CM

## 2012-06-21 DIAGNOSIS — C189 Malignant neoplasm of colon, unspecified: Secondary | ICD-10-CM

## 2012-06-21 NOTE — Telephone Encounter (Signed)
gv and printed pt appt schedule for Sept

## 2012-06-21 NOTE — Progress Notes (Signed)
Hematology and Oncology Follow Up Visit  [name] [id number] May 27, 1945 67 y.o. 06/21/2012 2:57 PM   Principle Diagnosis: 67 year old woman with stage IV colon cancer with a history of an isolated metastasis to the liver diagnosed in 04/2009 She is currently NED.   Prior Therapy:  She is status post colectomy as well as radiofrequency ablation to an isolated liver met on 05/05/2009 at [facility].  She is status post chemotherapy with FOLFOX 6 and Avastin from 06/08/2009 through 12/19/2009 requiring dose reduction for thrombocytopenia and leukopenia. CT scan findings were retroperitoneal and anterior adenopathy status post CT guided core biopsy on 03/15/2011 showed no evidence  of malignancy except for fibrosis. 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   Current therapy: Observation and follow up.   Interim History: [name] presents today for a follow up visit. She is a nice women with the above history. Since her last visit she has been doing well without any new complications. She report no GI symptoms, no bleeding or weight loss. She was started on new a hypertension medication by her PCP. Her energy levels are fair. She has no nausea, vomiting or abdominal pain. She has had no changes of bowel function.   Medications: I have reviewed the patient's current medications. Current outpatient prescriptions:albuterol (PROVENTIL HFA;VENTOLIN HFA) 108 (90 BASE) MCG/ACT inhaler, Inhale 2 puffs into the lungs every 6 (six) hours as needed. WHEEZING  , Disp: , Rfl: ;  aspirin 81 MG tablet, Take 81 mg by mouth daily., Disp: , Rfl: ;  cloNIDine (CATAPRES) 0.1 MG tablet, Take 0.1 mg by mouth 2 (two) times daily.  , Disp: , Rfl: ;  diltiazem (CARDIZEM CD) 180 MG 24 hr capsule, Take 180 mg by mouth every morning. , Disp: , Rfl:  ferrous gluconate (FERGON) 325 MG tablet, Take 325 mg by mouth 3 (three) times daily with meals.  , Disp: , Rfl: ;   Fluticasone-Salmeterol (ADVAIR) 100-50 MCG/DOSE AEPB, Inhale 2 puffs into the lungs 2 (two) times daily as needed. WHEEZING , Disp: , Rfl: ;  ibuprofen (ADVIL,MOTRIN) 400 MG tablet, Take 400 mg by mouth every 8 (eight) hours as needed. PAIN , Disp: , Rfl:  labetalol (NORMODYNE) 100 MG tablet, Take 200 mg by mouth 2 (two) times daily. , Disp: , Rfl: ;  levETIRAcetam (KEPPRA) 250 MG tablet, Take 250 mg by mouth every 12 (twelve) hours.  , Disp: , Rfl: ;  Multiple Vitamin (MULTIVITAMIN) tablet, Take 1 tablet by mouth daily.  , Disp: , Rfl: ;  nebivolol (BYSTOLIC) 10 MG tablet, Take 10 mg by mouth daily as needed. Take if  SBP > 145  .  Take at  Bedtime., Disp: , Rfl:  potassium chloride SA (K-DUR,KLOR-CON) 20 MEQ tablet, Take 20 mEq by mouth 3 (three) times daily.  , Disp: , Rfl: ;  temazepam (RESTORIL) 15 MG capsule, Take 15 mg by mouth at bedtime as needed. SLEEP , Disp: , Rfl:   Allergies: No Known Allergies  Past Medical History, Surgical history, Social history, and Family History were reviewed and updated.  Review of Systems: Constitutional:  Negative for fever, chills, night sweats, anorexia, weight loss, pain. Cardiovascular: no chest pain or dyspnea on exertion Respiratory: negative Neurological: negative Dermatological: negative ENT: negative Skin: Negative. Gastrointestinal: negative Genito-Urinary: negative Hematological and Lymphatic: negative Breast: negative Musculoskeletal: negative Remaining ROS negative. Physical Exam: Blood pressure 150/77, pulse 65, temperature 97.8 F (36.6 C), temperature source Oral, resp. rate  20, height 5\' 2"  (1.575 m), weight 195 lb 9.6 oz (88.724 kg). ECOG:  General appearance: alert Head: Normocephalic, without obvious abnormality, atraumatic Neck: no adenopathy, no carotid bruit, no JVD, supple, symmetrical, trachea midline and thyroid not enlarged, symmetric, no tenderness/mass/nodules Lymph nodes: Cervical, supraclavicular, and axillary nodes  normal. Heart:regular rate and rhythm, S1, S2 normal, no murmur, click, rub or gallop Lung:chest clear, no wheezing, rales, normal symmetric air entry Abdomin: soft, non-tender, without masses or organomegaly EXT:no erythema, induration, or nodules   Lab Results: Lab Results  Component Value Date   WBC 2.7* 06/19/2012   HGB 12.6 06/19/2012   HCT 37.1 06/19/2012   MCV 93.6 06/19/2012   PLT 138* 06/19/2012     Chemistry      Component Value Date/Time   NA 141 06/19/2012 0936   NA 142 06/10/2011 1152   NA 136 03/19/2011 0955   K 4.1 06/19/2012 0936   K 4.4 06/10/2011 1152   K 4.2 03/19/2011 0955   CL 106 06/19/2012 0936   CL 96* 06/10/2011 1152   CL 104 03/19/2011 0955   CO2 28 06/19/2012 0936   CO2 29 06/10/2011 1152   CO2 23 03/19/2011 0955   BUN 14.5 06/19/2012 0936   BUN 12 06/10/2011 1152   BUN 15 03/19/2011 0955   CREATININE 0.9 06/19/2012 0936   CREATININE 0.7 06/10/2011 1152   CREATININE 0.82 03/19/2011 0955      Component Value Date/Time   CALCIUM 9.3 06/19/2012 0936   CALCIUM 8.8 06/10/2011 1152   CALCIUM 9.3 03/19/2011 0955   ALKPHOS 68 06/19/2012 0936   ALKPHOS 53 06/10/2011 1152   ALKPHOS 48 03/19/2011 0955   AST 26 06/19/2012 0936   AST 23 06/10/2011 1152   AST 16 03/19/2011 0955   ALT 20 06/19/2012 0936   ALT 13 03/19/2011 0955   BILITOT 0.51 06/19/2012 0936   BILITOT 0.50 06/10/2011 1152   BILITOT 0.4 03/19/2011 0955       Radiological Studies: CT CHEST, ABDOMEN AND PELVIS WITH CONTRAST  Technique: Multidetector CT imaging of the chest, abdomen and  pelvis was performed following the standard protocol during bolus  administration of intravenous contrast.  Contrast: OMNIPAQUE IOHEXOL 300 MG/ML SOLN  Comparison: CT chest abdomen pelvis 06/10/2011.  CT CHEST  Findings:  Mediastinum: Heart size is mildly enlarged. There is no significant  pericardial fluid, thickening or pericardial calcification. There  is atherosclerosis of the thoracic aorta, the great vessels of the   mediastinum and the coronary arteries, including calcified  atherosclerotic plaque in the left main, left anterior descending  and right coronary arteries. Numerous borderline enlarged  mediastinal lymph nodes are again noted, which are conspicuous in  number rather than size. No definite pathologic mediastinal or  hilar lymph node enlargement is noted. Esophagus is unremarkable  in appearance.  Lungs/Pleura: There are a few scattered pulmonary nodules in the  lungs bilaterally which appear unchanged compared to the prior  examination. The largest of these measures only up to 4 mm in the  superior segment of the left lower lobe (image 26 of series 4). No  other new or enlarging suspicious appearing pulmonary nodules or  masses are otherwise noted. Mild diffuse bronchial wall thickening  with some mild cylindrical bronchiectasis, most pronounced in the  lower lobes of the lungs bilaterally. No acute consolidative  airspace disease. No pleural effusions.  Musculoskeletal: There are no aggressive appearing lytic or blastic  lesions noted in the visualized portions of the  skeleton.  IMPRESSION:  1. No definite signs to suggest metastatic disease to the thorax  on today's examination.  2. Numerous tiny pulmonary nodules scattered throughout the lungs  bilaterally are unchanged and nonspecific in appearance. The  largest of these measure only 4 mm, and are completely unchanged  dating back to 04/27/2010, compatible with benign findings.  3. Atherosclerosis, including left main and two-vessel coronary  artery disease. Assessment for potential risk factor modification,  dietary therapy or pharmacologic therapy may be warranted, if  clinically indicated.  4. Mild cardiomegaly.  5. Chronic bronchial wall thickening and lower lung predominant  mild cylindrical bronchiectasis is unchanged compared to the prior  study, likely sequela of prior infection.  CT ABDOMEN AND PELVIS  Findings:   Abdomen/Pelvis: Again noted is a large lesion centered in the right  lobe of the liver measuring at least 12.2 x 7.8 x 13.2 cm, which is  centrally low attenuation with peripheral nodular enhancement which  gradually extends into the center of the lesion on delayed images,  compatible with a massive cavernous hemangioma. There is also a  well defined low attenuation lesion adjacent to the falciform  ligament within segment 4A of the liver measuring approximately 3.2  x 2.5 cm, at the site of prior radiofrequency ablation. No new  hepatic lesions are noted. The appearance of the gallbladder,  pancreas, spleen and bilateral adrenal glands is unremarkable.  Numerous subcentimeter low attenuation lesions are noted in the  kidneys bilaterally (left greater than right), too small to  definitively characterize.  Normal appendix. No ascites or pneumoperitoneum and no pathologic  distension of small bowel. No definite pathologic lymphadenopathy  appreciated within the abdomen or pelvis. Status post  hysterectomy. Ovaries are not confidently identified and may be  surgically absent or atrophic. Urinary bladder is unremarkable in  appearance.  Musculoskeletal: There are no aggressive appearing lytic or blastic  lesions noted in the visualized portions of the skeleton.  IMPRESSION:  1. No findings to suggest metastatic disease in the abdomen or  pelvis on today's examination.  2. Post radiofrequency ablation defect in segment 4A of the liver  is similar to the prior examination.  3. Giant cavernous hemangioma in the right lobe of the liver  redemonstrated, as above.  4. Additional incidental findings, as above.    Impression and Plan: 67 year old woman with:  1. Stage IV colon cancer with a history of an isolated metastasis to the liver. Status post colectomy as well as radiofrequency ablation to an isolated liver met on 05/05/2009 at [facility]. Status post chemotherapy  with FOLFOX 6 and Avastin from 06/08/2009 through 12/19/2009. Her CT scan from 3/17 was discussed and continue to show no evidence of disease. For now, we will continue active follow up and with repeat exam, labs in 6 months and CT scan in 12 months.   2. 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 Her port has been removed on 06/2011.        Eli Hose, MD 3/19/20142:57 PM

## 2013-01-02 ENCOUNTER — Telehealth: Payer: Self-pay | Admitting: Oncology

## 2013-01-02 ENCOUNTER — Other Ambulatory Visit (HOSPITAL_BASED_OUTPATIENT_CLINIC_OR_DEPARTMENT_OTHER): Payer: Medicare Other | Admitting: Lab

## 2013-01-02 ENCOUNTER — Ambulatory Visit (HOSPITAL_BASED_OUTPATIENT_CLINIC_OR_DEPARTMENT_OTHER): Payer: Medicare Other | Admitting: Oncology

## 2013-01-02 VITALS — BP 143/65 | HR 63 | Temp 97.9°F | Resp 18 | Ht 62.0 in | Wt 197.9 lb

## 2013-01-02 DIAGNOSIS — Z85038 Personal history of other malignant neoplasm of large intestine: Secondary | ICD-10-CM

## 2013-01-02 DIAGNOSIS — C189 Malignant neoplasm of colon, unspecified: Secondary | ICD-10-CM

## 2013-01-02 DIAGNOSIS — D72819 Decreased white blood cell count, unspecified: Secondary | ICD-10-CM

## 2013-01-02 DIAGNOSIS — D696 Thrombocytopenia, unspecified: Secondary | ICD-10-CM

## 2013-01-02 DIAGNOSIS — Z86718 Personal history of other venous thrombosis and embolism: Secondary | ICD-10-CM

## 2013-01-02 LAB — CBC WITH DIFFERENTIAL/PLATELET
BASO%: 1.4 % (ref 0.0–2.0)
LYMPH%: 40.9 % (ref 14.0–49.7)
MCHC: 34 g/dL (ref 31.5–36.0)
MCV: 93.5 fL (ref 79.5–101.0)
MONO%: 11.7 % (ref 0.0–14.0)
Platelets: 137 10*3/uL — ABNORMAL LOW (ref 145–400)
RBC: 3.67 10*6/uL — ABNORMAL LOW (ref 3.70–5.45)
RDW: 12.9 % (ref 11.2–14.5)
WBC: 1.9 10*3/uL — ABNORMAL LOW (ref 3.9–10.3)

## 2013-01-02 LAB — COMPREHENSIVE METABOLIC PANEL (CC13)
ALT: 13 U/L (ref 0–55)
AST: 21 U/L (ref 5–34)
Alkaline Phosphatase: 59 U/L (ref 40–150)
Potassium: 3.8 mEq/L (ref 3.5–5.1)
Sodium: 133 mEq/L — ABNORMAL LOW (ref 136–145)
Total Bilirubin: 0.45 mg/dL (ref 0.20–1.20)
Total Protein: 6.9 g/dL (ref 6.4–8.3)

## 2013-01-02 NOTE — Telephone Encounter (Signed)
Gave pt appt for lab and MD on march 2015, also gave pt oral contrast

## 2013-01-02 NOTE — Progress Notes (Signed)
Hematology and Oncology Follow Up Visit  [name] [id number] [birth year] 67 y.o. 01/02/2013 2:27 PM   Principle Diagnosis: 67 year old woman with stage IV colon cancer with a history of an isolated metastasis to the liver diagnosed in 04/2009 She is currently NED.   Prior Therapy:  She is status post colectomy as well as radiofrequency ablation to an isolated liver met on 05/05/2009 at [facility].  She is status post chemotherapy with FOLFOX 6 and Avastin from 06/08/2009 through 12/19/2009 requiring dose reduction for thrombocytopenia and leukopenia. CT scan findings were retroperitoneal and anterior adenopathy status post CT guided core biopsy on 03/15/2011 showed no evidence  of malignancy except for fibrosis. 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   Current therapy: Observation and follow up.   Interim History: [name] presents today for a follow up visit. She is a nice women with the above history. Since her last visit she has been doing well without any new complications. She report no GI symptoms, no bleeding or weight loss. Her energy levels are fair. She has no nausea, vomiting or abdominal pain. She has had no changes of bowel function. She is still very functional and lives independently. She was able to drive and continue to do so as well as attend to her activity of daily living. She has not reported any bleeding problems nor any other constitutional symptoms.   Medications: I have reviewed the patient's current medications. Current Outpatient Prescriptions  Medication Sig Dispense Refill  . albuterol (PROVENTIL HFA;VENTOLIN HFA) 108 (90 BASE) MCG/ACT inhaler Inhale 2 puffs into the lungs every 6 (six) hours as needed. WHEEZING        . aspirin 81 MG tablet Take 81 mg by mouth daily.      . Azilsartan Medoxomil 40 MG TABS Take 40 mg by mouth daily.      . cloNIDine (CATAPRES) 0.1 MG tablet Take 0.1 mg by mouth 2  (two) times daily.        Marland Kitchen diltiazem (CARDIZEM CD) 180 MG 24 hr capsule Take 180 mg by mouth every morning.       . ferrous gluconate (FERGON) 325 MG tablet Take 325 mg by mouth 3 (three) times daily with meals.        . Fluticasone-Salmeterol (ADVAIR) 100-50 MCG/DOSE AEPB Inhale 2 puffs into the lungs 2 (two) times daily as needed. WHEEZING       . ibuprofen (ADVIL,MOTRIN) 400 MG tablet Take 400 mg by mouth every 8 (eight) hours as needed. PAIN       . labetalol (NORMODYNE) 100 MG tablet Take 200 mg by mouth 2 (two) times daily.       Marland Kitchen levETIRAcetam (KEPPRA) 250 MG tablet Take 250 mg by mouth every 12 (twelve) hours.        . Multiple Vitamin (MULTIVITAMIN) tablet Take 1 tablet by mouth daily.        . nebivolol (BYSTOLIC) 10 MG tablet Take 10 mg by mouth daily as needed. Take if  SBP > 145  .  Take at  Bedtime.      . potassium chloride SA (K-DUR,KLOR-CON) 20 MEQ tablet Take 20 mEq by mouth 3 (three) times daily.        . temazepam (RESTORIL) 15 MG capsule Take 15 mg by mouth at bedtime as needed. SLEEP        No current facility-administered medications for this visit.    Allergies: No Known Allergies  Past Medical History, Surgical history, Social history, and Family History were reviewed and updated.  Review of Systems:  Remaining ROS negative. Physical Exam: Blood pressure 143/65, pulse 63, temperature 97.9 F (36.6 C), temperature source Oral, resp. rate 18, height 5\' 2"  (1.575 m), weight 197 lb 14.4 oz (89.767 kg). ECOG: 1 General appearance: alert Head: Normocephalic, without obvious abnormality, atraumatic Neck: no adenopathy, no carotid bruit, no JVD, supple, symmetrical, trachea midline and thyroid not enlarged, symmetric, no tenderness/mass/nodules Lymph nodes: Cervical, supraclavicular, and axillary nodes normal. Heart:regular rate and rhythm, S1, S2 normal, no murmur, click, rub or gallop Lung:chest clear, no wheezing, rales, normal symmetric air entry Abdomin:  soft, non-tender, without masses or organomegaly EXT:no erythema, induration, or nodules   Lab Results: Lab Results  Component Value Date   WBC 1.9* 01/02/2013   HGB 11.7 01/02/2013   HCT 34.3* 01/02/2013   MCV 93.5 01/02/2013   PLT 137* 01/02/2013     Chemistry      Component Value Date/Time   NA 141 06/19/2012 0936   NA 142 06/10/2011 1152   NA 136 03/19/2011 0955   K 4.1 06/19/2012 0936   K 4.4 06/10/2011 1152   K 4.2 03/19/2011 0955   CL 106 06/19/2012 0936   CL 96* 06/10/2011 1152   CL 104 03/19/2011 0955   CO2 28 06/19/2012 0936   CO2 29 06/10/2011 1152   CO2 23 03/19/2011 0955   BUN 14.5 06/19/2012 0936   BUN 12 06/10/2011 1152   BUN 15 03/19/2011 0955   CREATININE 0.9 06/19/2012 0936   CREATININE 0.7 06/10/2011 1152   CREATININE 0.82 03/19/2011 0955      Component Value Date/Time   CALCIUM 9.3 06/19/2012 0936   CALCIUM 8.8 06/10/2011 1152   CALCIUM 9.3 03/19/2011 0955   ALKPHOS 68 06/19/2012 0936   ALKPHOS 53 06/10/2011 1152   ALKPHOS 48 03/19/2011 0955   AST 26 06/19/2012 0936   AST 23 06/10/2011 1152   AST 16 03/19/2011 0955   ALT 20 06/19/2012 0936   ALT 21 06/10/2011 1152   ALT 13 03/19/2011 0955   BILITOT 0.51 06/19/2012 0936   BILITOT 0.50 06/10/2011 1152   BILITOT 0.4 03/19/2011 0955        Impression and Plan: 67 year old woman with:  1. Stage IV colon cancer with a history of an isolated metastasis to the liver. Status post colectomy as well as radiofrequency ablation to an isolated liver met on 05/05/2009 at [facility]. Status post chemotherapy with FOLFOX 6 and Avastin from 06/08/2009 through 12/19/2009. Her CT scan from 06/19/2012 did not show any evidence of disease. For now, we will continue active follow up and with repeat exam, labs in 6 months and CT at that time.   2. 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 Her port has been removed on 06/2011.   3. Leukocytopenia and a mild thrombocytopenia: This has been chronic  in nature it is very possible that she could be developing a myelodysplastic syndrome but we will continue to monitor at this time.       Eli Hose, MD 9/30/20142:27 PM

## 2013-03-12 ENCOUNTER — Emergency Department (HOSPITAL_COMMUNITY)
Admission: EM | Admit: 2013-03-12 | Discharge: 2013-03-12 | Disposition: A | Discharge status: Home / Self Care | Payer: Medicare Other | Source: Home / Self Care | Attending: Family Medicine | Admitting: Family Medicine

## 2013-03-12 ENCOUNTER — Encounter (HOSPITAL_COMMUNITY): Payer: Self-pay | Admitting: Emergency Medicine

## 2013-03-12 DIAGNOSIS — J40 Bronchitis, not specified as acute or chronic: Secondary | ICD-10-CM

## 2013-03-12 DIAGNOSIS — B029 Zoster without complications: Secondary | ICD-10-CM

## 2013-03-12 MED ORDER — HYDROCODONE-ACETAMINOPHEN 5-325 MG PO TABS: 1.0000 | ORAL_TABLET | Freq: Four times a day (QID) | ORAL | Status: DC | PRN

## 2013-03-12 MED ORDER — AZITHROMYCIN 250 MG PO TABS: ORAL_TABLET | ORAL | Status: DC

## 2013-03-12 MED ORDER — VALACYCLOVIR HCL 1 G PO TABS: 1000.0000 mg | ORAL_TABLET | Freq: Three times a day (TID) | ORAL | Status: DC

## 2013-03-12 NOTE — ED Notes (Signed)
C/o cough for two weeks  C/o rash since last Thursday after putting cream on back for pain C/o back spasms

## 2013-03-12 NOTE — ED Provider Notes (Signed)
CSN: [id number]     Arrival date & time 03/12/13  1153 History   First MD Initiated Contact with Patient 03/12/13 1326     Chief Complaint  Patient presents with  . URI  . Rash  . Back Pain   (Consider location/radiation/quality/duration/timing/severity/associated sxs/prior Treatment) HPI Comments: Pt with a cold 2 weeks ago, all sx have resolved except has persistent dry cough. Also c/o rash on abd for 1 week, is itchy.   Patient is a 67 y.o. female presenting with back pain and cough. The history is provided by the patient.  Back Pain Location:  Thoracic spine Quality:  Stabbing Radiates to:  Does not radiate Pain severity:  Moderate Pain is:  Same all the time Onset quality:  Gradual Duration:  2 weeks Timing:  Constant Progression:  Unchanged Chronicity:  New Relieved by:  None tried Worsened by:  Nothing tried Ineffective treatments:  None tried Associated symptoms: no chest pain and no fever   Associated symptoms comment:  Rash Cough Cough characteristics:  Non-productive, barking and dry Severity:  Moderate Onset quality:  Gradual Duration:  2 weeks Timing:  Intermittent Progression:  Unchanged Chronicity:  New Context: upper respiratory infection   Relieved by:  None tried Worsened by:  Nothing tried Ineffective treatments:  None tried Associated symptoms: rash   Associated symptoms: no chest pain, no chills, no fever, no rhinorrhea, no shortness of breath, no sinus congestion and no wheezing     Past Medical History  Diagnosis Date  . 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 Hypertension   . Colon cancer   . Asthma   . DVT (deep venous thrombosis)     right jugular  . Anemia   . Arthritis   . Clotting disorder   . Seizures   . PONV (postoperative nausea and vomiting)   . Spinal headache    Past Surgical History  Procedure Laterality Date  . Abdominal hysterectomy  2012  . Small intestine surgery    . Colonoscopy    .  Portacath placement    . Colon surgery    . Radiofrequency ablation liver tumor     Family History  Problem Relation Age of Onset  . Hypertension Father   . Cancer Brother     colon  . Cancer Daughter     colon  . Breast cancer Sister    History  Substance Use Topics  . Smoking status: Never Smoker   . Smokeless tobacco: Never Used  . Alcohol Use: No   OB History   Grav Para Term Preterm Abortions TAB SAB Ect Mult Living                 Review of Systems  Constitutional: Negative for fever and chills.  HENT: Negative for rhinorrhea.   Respiratory: Positive for cough. Negative for shortness of breath and wheezing.   Cardiovascular: Negative for chest pain.  Musculoskeletal: Positive for back pain.  Skin: Positive for rash.    Allergies  Review of patient's allergies indicates no known allergies.  Home Medications   Current Outpatient Rx  Name  Route  Sig  Dispense  Refill  . albuterol (PROVENTIL HFA;VENTOLIN HFA) 108 (90 BASE) MCG/ACT inhaler   Inhalation   Inhale 2 puffs into the lungs every 6 (six) hours as needed. WHEEZING           . aspirin 81 MG tablet   Oral   Take 81  mg by mouth daily.         . Azilsartan Medoxomil 40 MG TABS   Oral   Take 40 mg by mouth daily.         Marland Kitchen azithromycin (ZITHROMAX Z-PAK) 250 MG tablet      Take 500mg  po on day 1, then 250mg  po daily for 4 days   6 tablet   0   . cloNIDine (CATAPRES) 0.1 MG tablet   Oral   Take 0.1 mg by mouth 2 (two) times daily.           Marland Kitchen diltiazem (CARDIZEM CD) 180 MG 24 hr capsule   Oral   Take 180 mg by mouth every morning.          . ferrous gluconate (FERGON) 325 MG tablet   Oral   Take 325 mg by mouth 3 (three) times daily with meals.           . Fluticasone-Salmeterol (ADVAIR) 100-50 MCG/DOSE AEPB   Inhalation   Inhale 2 puffs into the lungs 2 (two) times daily as needed. WHEEZING          . HYDROcodone-acetaminophen (NORCO/VICODIN) 5-325 MG per tablet   Oral    Take 1 tablet by mouth every 6 (six) hours as needed.   10 tablet   0   . ibuprofen (ADVIL,MOTRIN) 400 MG tablet   Oral   Take 400 mg by mouth every 8 (eight) hours as needed. PAIN          . labetalol (NORMODYNE) 100 MG tablet   Oral   Take 200 mg by mouth 2 (two) times daily.          Marland Kitchen levETIRAcetam (KEPPRA) 250 MG tablet   Oral   Take 250 mg by mouth every 12 (twelve) hours.           . Multiple Vitamin (MULTIVITAMIN) tablet   Oral   Take 1 tablet by mouth daily.           . nebivolol (BYSTOLIC) 10 MG tablet   Oral   Take 10 mg by mouth daily as needed. Take if  SBP > 145  .  Take at  Bedtime.         . potassium chloride SA (K-DUR,KLOR-CON) 20 MEQ tablet   Oral   Take 20 mEq by mouth 3 (three) times daily.           . temazepam (RESTORIL) 15 MG capsule   Oral   Take 15 mg by mouth at bedtime as needed. SLEEP          . valACYclovir (VALTREX) 1000 MG tablet   Oral   Take 1 tablet (1,000 mg total) by mouth 3 (three) times daily.   21 tablet   0    BP 146/99  Pulse 62  Temp(Src) 98.1 F (36.7 C) (Oral)  Resp 18  SpO2 98% Physical Exam  Constitutional: She appears well-developed and well-nourished. No distress.  HENT:  Right Ear: Tympanic membrane, external ear and ear canal normal.  Left Ear: Tympanic membrane, external ear and ear canal normal.  Nose: No mucosal edema.  Mouth/Throat: Oropharynx is clear and moist.  Cardiovascular: Normal rate and regular rhythm.   Pulmonary/Chest: Effort normal and breath sounds normal.  Musculoskeletal: She exhibits no tenderness.  Skin: Skin is warm and dry. Rash noted. Rash is vesicular.     Clusters of linear vesicles along dermatome on left side of body back/abd surrounded by erythema  c/w shingles    ED Course  Procedures (including critical care time) Labs Review Labs Reviewed - No data to display Imaging Review No results found.  EKG Interpretation    Date/Time:    Ventricular Rate:      PR Interval:    QRS Duration:   QT Interval:    QTC Calculation:   R Axis:     Text Interpretation:              MDM   1. Shingles   2. Bronchitis   persistent cough with no other assoc sx. Will tx as bronchitis. Rx z-pak. Rx valacyclovir 1000mg  TID #21. Rx hydrocodone/apap 5/325 1 q6 hours prn pain #10. Pt to f/u with pcp in one week.     Cathlyn Parsons, NP 03/12/13 1415

## 2013-03-13 NOTE — ED Provider Notes (Signed)
Medical screening examination/treatment/procedure(s) were performed by a resident physician or non-physician practitioner and as the supervising physician I was immediately available for consultation/collaboration.  Myrle Wanek, MD    Brunette Lavalle S Michelene Keniston, MD 03/13/13 0950 

## 2013-03-23 ENCOUNTER — Emergency Department (HOSPITAL_COMMUNITY)
Admission: EM | Admit: 2013-03-23 | Discharge: 2013-03-23 | Disposition: A | Discharge status: Home / Self Care | Payer: Medicare Other | Source: Home / Self Care | Attending: Family Medicine | Admitting: Family Medicine

## 2013-03-23 ENCOUNTER — Encounter (HOSPITAL_COMMUNITY): Payer: Self-pay | Admitting: Emergency Medicine

## 2013-03-23 DIAGNOSIS — B029 Zoster without complications: Secondary | ICD-10-CM

## 2013-03-23 LAB — POCT URINALYSIS DIP (DEVICE)
Bilirubin Urine: NEGATIVE
Glucose, UA: NEGATIVE mg/dL
Hgb urine dipstick: NEGATIVE
Nitrite: NEGATIVE
Urobilinogen, UA: 0.2 mg/dL (ref 0.0–1.0)
pH: 7 (ref 5.0–8.0)

## 2013-03-23 MED ORDER — HYDROCODONE-ACETAMINOPHEN 7.5-325 MG PO TABS: 1.0000 | ORAL_TABLET | ORAL | Status: DC | PRN

## 2013-03-23 MED ORDER — VALACYCLOVIR HCL 1 G PO TABS: 1000.0000 mg | ORAL_TABLET | Freq: Three times a day (TID) | ORAL | Status: DC

## 2013-03-23 MED ORDER — HYDROCODONE-ACETAMINOPHEN 5-325 MG PO TABS
2.0000 | ORAL_TABLET | Freq: Once | ORAL | Status: AC
  Administered 2013-03-23: 2 via ORAL

## 2013-03-23 MED ORDER — HYDROCODONE-ACETAMINOPHEN 5-325 MG PO TABS
ORAL_TABLET | ORAL | Status: AC
  Filled 2013-03-23: qty 2

## 2013-03-23 NOTE — ED Provider Notes (Signed)
CSN: [id number]     Arrival date & time 03/23/13  1542 History   First MD Initiated Contact with Patient 03/23/13 1633     Chief Complaint  Patient presents with  . Rash   (Consider location/radiation/quality/duration/timing/severity/associated sxs/prior Treatment) HPI Comments: 67 year old female presents complaining of painful rash around the left side of her back and abdomen. She was diagnosed with shingles a few weeks ago. This rash has persisted. She says now it feels like "her skin is getting peeled off." She says the rash is very itchy as well, and she is having back pain associated with this. She states she wants to mainly make sure she is not having a heart attack and figure out why she is in so much pain. She has never had a heart attack before. No nausea, vomiting, diaphoresis, shortness of breath, chest pain.  Patient is a 67 y.o. female presenting with rash.  Rash Associated symptoms: no abdominal pain, no fever, no joint pain, no myalgias, no nausea, no shortness of breath and not vomiting     Past Medical History  Diagnosis Date  . 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 Hypertension   . Colon cancer   . Asthma   . DVT (deep venous thrombosis)     right jugular  . Anemia   . Arthritis   . Clotting disorder   . Seizures   . PONV (postoperative nausea and vomiting)   . Spinal headache    Past Surgical History  Procedure Laterality Date  . Abdominal hysterectomy  2012  . Small intestine surgery    . Colonoscopy    . Portacath placement    . Colon surgery    . Radiofrequency ablation liver tumor     Family History  Problem Relation Age of Onset  . Hypertension Father   . Cancer Brother     colon  . Cancer Daughter     colon  . Breast cancer Sister    History  Substance Use Topics  . Smoking status: Never Smoker   . Smokeless tobacco: Never Used  . Alcohol Use: No   OB History   Grav Para Term Preterm Abortions TAB SAB Ect Mult  Living                 Review of Systems  Constitutional: Negative for fever and chills.  Eyes: Negative for visual disturbance.  Respiratory: Negative for cough and shortness of breath.   Cardiovascular: Negative for chest pain, palpitations and leg swelling.  Gastrointestinal: Negative for nausea, vomiting and abdominal pain.  Endocrine: Negative for polydipsia and polyuria.  Genitourinary: Negative for dysuria, urgency and frequency.  Musculoskeletal: Positive for back pain. Negative for arthralgias and myalgias.  Skin: Positive for rash.  Neurological: Negative for dizziness, weakness and light-headedness.    Allergies  Review of patient's allergies indicates no known allergies.  Home Medications   Current Outpatient Rx  Name  Route  Sig  Dispense  Refill  . aspirin 81 MG tablet   Oral   Take 81 mg by mouth daily.         . cloNIDine (CATAPRES) 0.1 MG tablet   Oral   Take 0.1 mg by mouth 2 (two) times daily.           Marland Kitchen diltiazem (CARDIZEM CD) 180 MG 24 hr capsule   Oral   Take 180 mg by mouth every morning.          Marland Kitchen  ibuprofen (ADVIL,MOTRIN) 400 MG tablet   Oral   Take 400 mg by mouth every 8 (eight) hours as needed. PAIN          . labetalol (NORMODYNE) 100 MG tablet   Oral   Take 200 mg by mouth 2 (two) times daily.          Marland Kitchen levETIRAcetam (KEPPRA) 250 MG tablet   Oral   Take 250 mg by mouth every 12 (twelve) hours.           . Multiple Vitamin (MULTIVITAMIN) tablet   Oral   Take 1 tablet by mouth daily.           . nebivolol (BYSTOLIC) 10 MG tablet   Oral   Take 10 mg by mouth daily as needed. Take if  SBP > 145  .  Take at  Bedtime.         . potassium chloride SA (K-DUR,KLOR-CON) 20 MEQ tablet   Oral   Take 20 mEq by mouth 3 (three) times daily.           Marland Kitchen albuterol (PROVENTIL HFA;VENTOLIN HFA) 108 (90 BASE) MCG/ACT inhaler   Inhalation   Inhale 2 puffs into the lungs every 6 (six) hours as needed. WHEEZING           .  Azilsartan Medoxomil 40 MG TABS   Oral   Take 40 mg by mouth daily.         Marland Kitchen azithromycin (ZITHROMAX Z-PAK) 250 MG tablet      Take 500mg  po on day 1, then 250mg  po daily for 4 days   6 tablet   0   . ferrous gluconate (FERGON) 325 MG tablet   Oral   Take 325 mg by mouth 3 (three) times daily with meals.           . Fluticasone-Salmeterol (ADVAIR) 100-50 MCG/DOSE AEPB   Inhalation   Inhale 2 puffs into the lungs 2 (two) times daily as needed. WHEEZING          . HYDROcodone-acetaminophen (NORCO) 7.5-325 MG per tablet   Oral   Take 1 tablet by mouth every 4 (four) hours as needed (for pain).   30 tablet   0   . HYDROcodone-acetaminophen (NORCO/VICODIN) 5-325 MG per tablet   Oral   Take 1 tablet by mouth every 6 (six) hours as needed.   10 tablet   0   . temazepam (RESTORIL) 15 MG capsule   Oral   Take 15 mg by mouth at bedtime as needed. SLEEP          . valACYclovir (VALTREX) 1000 MG tablet   Oral   Take 1 tablet (1,000 mg total) by mouth 3 (three) times daily.   21 tablet   0    BP 140/65  Pulse 70  Temp(Src) 99 F (37.2 C) (Oral)  Resp 18  SpO2 97% Physical Exam  Nursing note and vitals reviewed. Constitutional: She is oriented to person, place, and time. Vital signs are normal. She appears well-developed and well-nourished. No distress.  HENT:  Head: Normocephalic and atraumatic.  Cardiovascular: Normal rate, regular rhythm and normal heart sounds.   Pulmonary/Chest: Effort normal and breath sounds normal. No respiratory distress.  Neurological: She is alert and oriented to person, place, and time. She has normal strength. Coordination normal.  Skin: Skin is warm and dry. Rash noted. She is not diaphoretic.     Psychiatric: She has a normal mood and affect. Judgment  normal.    ED Course  Procedures (including critical care time) Labs Review Labs Reviewed  POCT URINALYSIS DIP (DEVICE) - Abnormal; Notable for the following:    Protein, ur  30 (*)    Leukocytes, UA TRACE (*)    All other components within normal limits   Imaging Review No results found.    MDM   1. Shingles    EKG and urinalysis normal. We'll repeat 1 week course of Valtrex and prescribe more Norco for pain. Followup with PCP or when necessary   Meds ordered this encounter  Medications  . HYDROcodone-acetaminophen (NORCO/VICODIN) 5-325 MG per tablet 2 tablet    Sig:   . valACYclovir (VALTREX) 1000 MG tablet    Sig: Take 1 tablet (1,000 mg total) by mouth 3 (three) times daily.    Dispense:  21 tablet    Refill:  0    Order Specific Question:  Supervising Provider    Answer:  Lorenz Coaster, DAVID C [id number]  . HYDROcodone-acetaminophen (NORCO) 7.5-325 MG per tablet    Sig: Take 1 tablet by mouth every 4 (four) hours as needed (for pain).    Dispense:  30 tablet    Refill:  0    Order Specific Question:  Supervising Provider    Answer:  Bradd Canary D [[id number]]       Graylon Good, PA-C 03/23/13 1756

## 2013-03-23 NOTE — ED Notes (Signed)
C/o painful rash that starts at center of stomach and wraps around left side to mid back.  Recent dx of shingles.  Pt states that the pain is worse and is having pain in left breast particularly at nipple.  No relief with ibuprofen

## 2013-03-26 ENCOUNTER — Emergency Department (HOSPITAL_COMMUNITY): Payer: Medicare Other

## 2013-03-26 ENCOUNTER — Emergency Department (HOSPITAL_COMMUNITY)
Admission: EM | Admit: 2013-03-26 | Discharge: 2013-03-26 | Disposition: A | Discharge status: Home / Self Care | Payer: Medicare Other | Attending: Emergency Medicine | Admitting: Emergency Medicine

## 2013-03-26 ENCOUNTER — Encounter (HOSPITAL_COMMUNITY): Payer: Self-pay | Admitting: Emergency Medicine

## 2013-03-26 DIAGNOSIS — G609 Hereditary and idiopathic neuropathy, unspecified: Secondary | ICD-10-CM | POA: Insufficient documentation

## 2013-03-26 DIAGNOSIS — R198 Other specified symptoms and signs involving the digestive system and abdomen: Secondary | ICD-10-CM | POA: Insufficient documentation

## 2013-03-26 DIAGNOSIS — D649 Anemia, unspecified: Secondary | ICD-10-CM | POA: Insufficient documentation

## 2013-03-26 DIAGNOSIS — Z79899 Other long term (current) drug therapy: Secondary | ICD-10-CM | POA: Insufficient documentation

## 2013-03-26 DIAGNOSIS — J45909 Unspecified asthma, uncomplicated: Secondary | ICD-10-CM | POA: Insufficient documentation

## 2013-03-26 DIAGNOSIS — I10 Essential (primary) hypertension: Secondary | ICD-10-CM | POA: Insufficient documentation

## 2013-03-26 DIAGNOSIS — R1110 Vomiting, unspecified: Secondary | ICD-10-CM | POA: Insufficient documentation

## 2013-03-26 DIAGNOSIS — R569 Unspecified convulsions: Secondary | ICD-10-CM | POA: Insufficient documentation

## 2013-03-26 DIAGNOSIS — Z86718 Personal history of other venous thrombosis and embolism: Secondary | ICD-10-CM | POA: Insufficient documentation

## 2013-03-26 DIAGNOSIS — Z8669 Personal history of other diseases of the nervous system and sense organs: Secondary | ICD-10-CM | POA: Insufficient documentation

## 2013-03-26 DIAGNOSIS — Z85038 Personal history of other malignant neoplasm of large intestine: Secondary | ICD-10-CM | POA: Insufficient documentation

## 2013-03-26 DIAGNOSIS — Z7982 Long term (current) use of aspirin: Secondary | ICD-10-CM | POA: Insufficient documentation

## 2013-03-26 DIAGNOSIS — B029 Zoster without complications: Secondary | ICD-10-CM | POA: Insufficient documentation

## 2013-03-26 DIAGNOSIS — D689 Coagulation defect, unspecified: Secondary | ICD-10-CM | POA: Insufficient documentation

## 2013-03-26 DIAGNOSIS — K429 Umbilical hernia without obstruction or gangrene: Secondary | ICD-10-CM | POA: Insufficient documentation

## 2013-03-26 DIAGNOSIS — M129 Arthropathy, unspecified: Secondary | ICD-10-CM | POA: Insufficient documentation

## 2013-03-26 LAB — CBC
HCT: 35.2 % — ABNORMAL LOW (ref 36.0–46.0)
Hemoglobin: 12.5 g/dL (ref 12.0–15.0)
MCH: 32.5 pg (ref 26.0–34.0)
MCHC: 35.5 g/dL (ref 30.0–36.0)
MCV: 91.4 fL (ref 78.0–100.0)
Platelets: 192 10*3/uL (ref 150–400)
RBC: 3.85 MIL/uL — ABNORMAL LOW (ref 3.87–5.11)
RDW: 12.4 % (ref 11.5–15.5)
WBC: 3.1 10*3/uL — ABNORMAL LOW (ref 4.0–10.5)

## 2013-03-26 LAB — COMPREHENSIVE METABOLIC PANEL WITH GFR
ALT: 12 U/L (ref 0–35)
Alkaline Phosphatase: 55 U/L (ref 39–117)
BUN: 11 mg/dL (ref 6–23)
CO2: 27 meq/L (ref 19–32)
Calcium: 9.2 mg/dL (ref 8.4–10.5)
GFR calc non Af Amer: 84 mL/min — ABNORMAL LOW (ref >90)
Glucose, Bld: 113 mg/dL — ABNORMAL HIGH (ref 70–99)
Total Protein: 7.7 g/dL (ref 6.0–8.3)

## 2013-03-26 LAB — COMPREHENSIVE METABOLIC PANEL
AST: 24 U/L (ref 0–37)
Albumin: 3.7 g/dL (ref 3.5–5.2)
Chloride: 93 mEq/L — ABNORMAL LOW (ref 96–112)
Creatinine, Ser: 0.75 mg/dL (ref 0.50–1.10)
GFR calc Af Amer: >90 mL/min (ref >90)
Potassium: 4 mEq/L (ref 3.5–5.1)
Sodium: 129 mEq/L — ABNORMAL LOW (ref 135–145)
Total Bilirubin: 0.5 mg/dL (ref 0.3–1.2)

## 2013-03-26 LAB — URINALYSIS, ROUTINE W REFLEX MICROSCOPIC
Bilirubin Urine: NEGATIVE
Glucose, UA: NEGATIVE mg/dL
Hgb urine dipstick: NEGATIVE
Ketones, ur: NEGATIVE mg/dL
Leukocytes, UA: NEGATIVE
Nitrite: NEGATIVE
Protein, ur: NEGATIVE mg/dL
Specific Gravity, Urine: 1.022 (ref 1.005–1.030)
Urobilinogen, UA: 0.2 mg/dL (ref 0.0–1.0)
pH: 7.5 (ref 5.0–8.0)

## 2013-03-26 LAB — LIPASE, BLOOD: Lipase: 18 U/L (ref 11–59)

## 2013-03-26 MED ORDER — GABAPENTIN 300 MG PO CAPS: 300.0000 mg | ORAL_CAPSULE | Freq: Every day | ORAL | Status: DC

## 2013-03-26 MED ORDER — MORPHINE SULFATE 2 MG/ML IJ SOLN
1.0000 mg | Freq: Once | INTRAMUSCULAR | Status: AC
  Administered 2013-03-26: 1 mg via INTRAVENOUS
  Filled 2013-03-26: qty 1

## 2013-03-26 MED ORDER — IOHEXOL 300 MG/ML  SOLN: 100.0000 mL | Freq: Once | INTRAMUSCULAR | Status: DC | PRN

## 2013-03-26 MED ORDER — OXYCODONE-ACETAMINOPHEN 5-325 MG PO TABS: 1.0000 | ORAL_TABLET | Freq: Four times a day (QID) | ORAL | Status: DC | PRN

## 2013-03-26 MED ORDER — GLYCERIN (LAXATIVE) 2 G RE SUPP: 1.0000 | Freq: Once | RECTAL | Status: DC | PRN

## 2013-03-26 MED ORDER — IOHEXOL 300 MG/ML  SOLN
25.0000 mL | INTRAMUSCULAR | Status: AC
  Administered 2013-03-26: 25 mL via ORAL

## 2013-03-26 MED ORDER — POLYETHYLENE GLYCOL 3350 17 GM/SCOOP PO POWD: 1.0000 | Freq: Three times a day (TID) | ORAL | Status: DC

## 2013-03-26 NOTE — ED Provider Notes (Signed)
I saw and evaluated the patient, reviewed the resident's note and I agree with the findings and plan.  EKG Interpretation   None       Pt with h/o shingles, arleady undergoing treatment, has priro h/o colon cnacer presumably in remission.  Third visit for same pain.  CT performed to ensure no occult diverticulitis or recurrence of cancer may be causing pain.  No acute findings on CT scan, can be followed up by PCP, additional Rx given for symptoms.  Abd is soft, no rebound tenderness.  Rash consistent with healing shingles.   Gavin Pound. Jerene Yeager, MD 03/26/13 1531

## 2013-03-26 NOTE — ED Provider Notes (Signed)
CSN: [id number]     Arrival date & time 03/26/13  1002 History   First MD Initiated Contact with Patient 03/26/13 1113     Chief Complaint  Patient presents with  . Abdominal Pain   HPI 67 yo female with h/o stage 4 colon cancer and recently diagnosed with shingles who presents with left sided abdominal pain. Pain starts in the left upper quadrant and radiates to the left flank and back. It is burning and sharp in nature, intermittent that lasts for a few hours at a time. She was diagnosed with shingles on 03/12/13 and prescribed valtrex and vicodin. She came back to urgent care for persistent pain and was given refills on norco and valtrex. She comes to the ED due to unbearable pain.  She had one episode of vomiting yesterday. Denies any current nausea. She has not had a bowel movement in 2 days. She denies any dysuria or increased urinary frequency. She denies any fevers or chills.   Past Medical History  Diagnosis Date  . 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 Hypertension   . Colon cancer   . Asthma   . DVT (deep venous thrombosis)     right jugular  . Anemia   . Arthritis   . Clotting disorder   . Seizures   . PONV (postoperative nausea and vomiting)   . Spinal headache    Past Surgical History  Procedure Laterality Date  . Abdominal hysterectomy  2012  . Small intestine surgery    . Colonoscopy    . Portacath placement    . Colon surgery    . Radiofrequency ablation liver tumor     Family History  Problem Relation Age of Onset  . Hypertension Father   . Cancer Brother     colon  . Cancer Daughter     colon  . Breast cancer Sister    History  Substance Use Topics  . Smoking status: Never Smoker   . Smokeless tobacco: Never Used  . Alcohol Use: No   OB History   Grav Para Term Preterm Abortions TAB SAB Ect Mult Living                 Review of Systems Negative except per HPI Allergies  Review of patient's allergies indicates no known  allergies.  Home Medications   Current Outpatient Rx  Name  Route  Sig  Dispense  Refill  . albuterol (PROVENTIL HFA;VENTOLIN HFA) 108 (90 BASE) MCG/ACT inhaler   Inhalation   Inhale 2 puffs into the lungs every 6 (six) hours as needed. WHEEZING           . aspirin 81 MG tablet   Oral   Take 81 mg by mouth daily.         . Azilsartan Medoxomil 40 MG TABS   Oral   Take 40 mg by mouth daily.         . cloNIDine (CATAPRES) 0.1 MG tablet   Oral   Take 0.1 mg by mouth 2 (two) times daily.           Marland Kitchen diltiazem (CARDIZEM CD) 180 MG 24 hr capsule   Oral   Take 180 mg by mouth every morning.          . ferrous gluconate (FERGON) 325 MG tablet   Oral   Take 325 mg by mouth 3 (three) times daily with meals.           Marland Kitchen  Fluticasone-Salmeterol (ADVAIR) 100-50 MCG/DOSE AEPB   Inhalation   Inhale 2 puffs into the lungs 2 (two) times daily as needed. WHEEZING          . HYDROcodone-acetaminophen (NORCO) 7.5-325 MG per tablet   Oral   Take 1 tablet by mouth every 4 (four) hours as needed (for pain).   30 tablet   0   . ibuprofen (ADVIL,MOTRIN) 400 MG tablet   Oral   Take 400 mg by mouth every 8 (eight) hours as needed. PAIN          . labetalol (NORMODYNE) 100 MG tablet   Oral   Take 200 mg by mouth 2 (two) times daily.          Marland Kitchen levETIRAcetam (KEPPRA) 250 MG tablet   Oral   Take 250 mg by mouth every 12 (twelve) hours.           . Multiple Vitamin (MULTIVITAMIN) tablet   Oral   Take 1 tablet by mouth daily.           . nebivolol (BYSTOLIC) 10 MG tablet   Oral   Take 10 mg by mouth daily as needed. Take if  SBP > 145  .  Take at  Bedtime.         . potassium chloride SA (K-DUR,KLOR-CON) 20 MEQ tablet   Oral   Take 20 mEq by mouth 3 (three) times daily.           . temazepam (RESTORIL) 15 MG capsule   Oral   Take 15 mg by mouth at bedtime as needed. SLEEP          . valACYclovir (VALTREX) 1000 MG tablet   Oral   Take 1 tablet  (1,000 mg total) by mouth 3 (three) times daily.   21 tablet   0   . gabapentin (NEURONTIN) 300 MG capsule   Oral   Take 1 capsule (300 mg total) by mouth at bedtime. If you tolerate the night time dose, you can increase to twice a day   30 capsule   1   . glycerin adult (GLYCERIN ADULT) 2 G SUPP   Rectal   Place 1 suppository rectally once as needed for moderate constipation.   10 suppository   0   . oxyCODONE-acetaminophen (ROXICET) 5-325 MG per tablet   Oral   Take 1 tablet by mouth every 6 (six) hours as needed for severe pain.   30 tablet   0   . polyethylene glycol powder (GLYCOLAX/MIRALAX) powder   Oral   Take 255 g (1 Container total) by mouth 3 (three) times daily.   255 g   0    BP 136/88  Pulse 76  Temp(Src) 98.7 F (37.1 C) (Oral)  Resp 18  Ht 5\' 1"  (1.549 m)  SpO2 100% Physical Exam General: in mild distress due to pain, alert and oriented x4 HEENT: moist mucous membranes, oropharynx clear, PERRLA, EOMI CV: S1S2, RRR, no murmur appreciated Pulm: normal work of breathing, clear to auscultation bilaterally Abdomen: tender along the left upper quadrant and flank along rash, no rebound, no guarding, no hepatosplenomegally Skin: healing vesicular rash along the T10-T11 dermatomal distribution from the umbilicus to the thoracic spine on the left ED Course  Procedures (including critical care time) Labs Review Labs Reviewed  COMPREHENSIVE METABOLIC PANEL - Abnormal; Notable for the following:    Sodium 129 (*)    Chloride 93 (*)    Glucose, Bld 113 (*)  GFR calc non Af Amer 84 (*)    All other components within normal limits  CBC - Abnormal; Notable for the following:    WBC 3.1 (*)    RBC 3.85 (*)    HCT 35.2 (*)    All other components within normal limits  LIPASE, BLOOD  URINALYSIS, ROUTINE W REFLEX MICROSCOPIC   Imaging Review Ct Abdomen Pelvis W Contrast  03/26/2013   CLINICAL DATA:  Current history of metastatic colon cancer, presenting  with left lower quadrant abdominal pain and constipation.  EXAM: CT ABDOMEN AND PELVIS WITH CONTRAST  TECHNIQUE: Multidetector CT imaging of the abdomen and pelvis was performed using the standard protocol following bolus administration of intravenous contrast.  CONTRAST:  100 CC Omnipaque 300 IV. Oral contrast was also administered.  COMPARISON:  06/19/2012, 06/10/2011, 06/26/2010. PET-CT 03/02/2011, 04/27/2010. Large metastasis involving much of the right lobe of the liver,  FINDINGS: Large cavernous hemangioma involving much of the right lobe of the liver which has increased in size since the examination 9 months ago, current maximum measurements approximating 14.0 x 8.0 x 10.0 cm, previously 12.2 x 7.8 x 11.3 cm. A low-attenuation focus in the medial segment left lobe has not significantly changed since the prior examination, measuring approximately 3.4 x 2.3 x 2.7 cm, previously 3.2 x 2.5 x 2.4 cm. No new hepatic masses.  Normal-appearing spleen, pancreas, adrenal glands, and gallbladder. No biliary ductal dilation. Pancreatic tail is situated between the spleen and the stomach in the left upper quadrant. Small cortical cysts involving both kidneys, unchanged; no significant abnormality involving either kidney. Mild aortoiliofemoral atherosclerosis. No significant lymphadenopathy.  Normal appearing stomach. Midline periumbilical abdominal wall hernia containing a loop of normal-appearing small bowel. No evidence of bowel obstruction. Focally dilated loop of small bowel in the right side of the mid pelvis, unchanged. Remainder of the small bowel normal in appearance. Large stool burden throughout the colon. Colonic anastomosis in the vicinity of the splenic flexure which appears widely patent. Normal appendix in the right mid pelvis. No ascites.  Urinary bladder unremarkable. Uterus surgically absent. No adnexal masses or free pelvic fluid. Phleboliths low in the left side of the pelvis.  Bone window images  demonstrate lower thoracic spondylosis, degenerative changes throughout the facet joints of the lumbar spine, degenerative changes involving the sacroiliac joints, and generalized osseous demineralization. Visualized lung bases clear, allowing for the respiratory motion. Stable upper normal to slightly enlarged heart.  IMPRESSION: 1. No acute abnormalities involving the abdomen or pelvis. 2. Large cavernous hemangioma involving much of the right lobe of the liver, slightly increased in size since the prior examination, measurements given above. 3. Stable radiofrequency ablation defect in the medial segment left lobe of liver. No evidence of hepatic metastasis currently. 4. Midline periumbilical abdominal wall hernia containing a loop of normal-appearing small bowel. 5. Focally dilated loop of small bowel in the right mid pelvis consistent with an atonic segment. 6. Widely patent colonic anastomosis at the splenic flexure. Large stool burden throughout the colon.   Electronically Signed   By: Hulan Saas M.D.   On: 03/26/2013 14:20    EKG Interpretation   None       MDM   1. 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 Will obtain CT abdomen/Pelvis.  Treat pain with morphine 1mg  IV x2 with improvement  Ct abdomen showing no acute abnormality. Will discharge patient  home with percocet and gabapentin for treatment of pain. Will also treat constipation with miralax and glycerin suppository. Recommendation for close follow up with PCP.   Marena Chancy, PGY-3 Family Medicine Resident     Lonia Skinner, MD 03/26/13 [phone]

## 2013-03-26 NOTE — ED Notes (Signed)
Pt given oral contrast by CT technician.

## 2013-03-26 NOTE — ED Notes (Signed)
Pt completed oral contrast. CT notified

## 2013-03-26 NOTE — ED Notes (Signed)
Pt is here with diagnosed shingles to left abdomen and to back.  Placed mask on patient.  Pt states she has no appetite and vomited once.

## 2013-03-26 NOTE — ED Provider Notes (Signed)
Medical screening examination/treatment/procedure(s) were performed by resident physician or non-physician practitioner and as supervising physician I was immediately available for consultation/collaboration.   Barkley Bruns MD.   Linna Hoff, MD 03/26/13 [phone]

## 2013-04-04 ENCOUNTER — Emergency Department (HOSPITAL_COMMUNITY)
Admission: EM | Admit: 2013-04-04 | Discharge: 2013-04-04 | Disposition: A | Discharge status: Home / Self Care | Payer: Medicare Other | Attending: Emergency Medicine | Admitting: Emergency Medicine

## 2013-04-04 ENCOUNTER — Other Ambulatory Visit (HOSPITAL_COMMUNITY): Payer: Self-pay | Admitting: Pulmonary Disease

## 2013-04-04 ENCOUNTER — Ambulatory Visit (HOSPITAL_COMMUNITY)
Admission: RE | Admit: 2013-04-04 | Discharge: 2013-04-04 | Disposition: A | Discharge status: Home / Self Care | Payer: Medicare Other | Source: Ambulatory Visit | Attending: Pulmonary Disease | Admitting: Pulmonary Disease

## 2013-04-04 ENCOUNTER — Encounter (HOSPITAL_COMMUNITY): Payer: Self-pay | Admitting: Emergency Medicine

## 2013-04-04 DIAGNOSIS — Z79899 Other long term (current) drug therapy: Secondary | ICD-10-CM | POA: Insufficient documentation

## 2013-04-04 DIAGNOSIS — R21 Rash and other nonspecific skin eruption: Secondary | ICD-10-CM | POA: Insufficient documentation

## 2013-04-04 DIAGNOSIS — Z7982 Long term (current) use of aspirin: Secondary | ICD-10-CM | POA: Insufficient documentation

## 2013-04-04 DIAGNOSIS — G40909 Epilepsy, unspecified, not intractable, without status epilepticus: Secondary | ICD-10-CM | POA: Insufficient documentation

## 2013-04-04 DIAGNOSIS — J45909 Unspecified asthma, uncomplicated: Secondary | ICD-10-CM | POA: Insufficient documentation

## 2013-04-04 DIAGNOSIS — R109 Unspecified abdominal pain: Secondary | ICD-10-CM | POA: Insufficient documentation

## 2013-04-04 DIAGNOSIS — R252 Cramp and spasm: Secondary | ICD-10-CM

## 2013-04-04 DIAGNOSIS — M47817 Spondylosis without myelopathy or radiculopathy, lumbosacral region: Secondary | ICD-10-CM | POA: Insufficient documentation

## 2013-04-04 DIAGNOSIS — M259 Joint disorder, unspecified: Secondary | ICD-10-CM | POA: Insufficient documentation

## 2013-04-04 DIAGNOSIS — C787 Secondary malignant neoplasm of liver and intrahepatic bile duct: Secondary | ICD-10-CM | POA: Insufficient documentation

## 2013-04-04 DIAGNOSIS — B029 Zoster without complications: Secondary | ICD-10-CM | POA: Insufficient documentation

## 2013-04-04 DIAGNOSIS — Z85038 Personal history of other malignant neoplasm of large intestine: Secondary | ICD-10-CM | POA: Insufficient documentation

## 2013-04-04 DIAGNOSIS — I10 Essential (primary) hypertension: Secondary | ICD-10-CM | POA: Insufficient documentation

## 2013-04-04 DIAGNOSIS — M899 Disorder of bone, unspecified: Secondary | ICD-10-CM | POA: Insufficient documentation

## 2013-04-04 DIAGNOSIS — M129 Arthropathy, unspecified: Secondary | ICD-10-CM | POA: Insufficient documentation

## 2013-04-04 DIAGNOSIS — D649 Anemia, unspecified: Secondary | ICD-10-CM | POA: Insufficient documentation

## 2013-04-04 DIAGNOSIS — M47814 Spondylosis without myelopathy or radiculopathy, thoracic region: Secondary | ICD-10-CM | POA: Insufficient documentation

## 2013-04-04 DIAGNOSIS — Z86718 Personal history of other venous thrombosis and embolism: Secondary | ICD-10-CM | POA: Insufficient documentation

## 2013-04-04 MED ORDER — OXYCODONE-ACETAMINOPHEN 5-325 MG PO TABS: 1.0000 | ORAL_TABLET | Freq: Four times a day (QID) | ORAL | Status: DC | PRN

## 2013-04-04 MED ORDER — OXYCODONE-ACETAMINOPHEN 5-325 MG PO TABS
1.0000 | ORAL_TABLET | Freq: Once | ORAL | Status: AC
  Administered 2013-04-04: 1 via ORAL
  Filled 2013-04-04: qty 1

## 2013-04-04 NOTE — ED Provider Notes (Signed)
CSN: [id number]     Arrival date & time 04/04/13  1514 History   First MD Initiated Contact with Patient 04/04/13 1642     Chief Complaint  Patient presents with  . Back Pain   (Consider location/radiation/quality/duration/timing/severity/associated sxs/prior Treatment) HPI Complains of left-sided flank pain onset approximately 3 weeks ago. Pain is at site of rash. She was seen here on 03/26/2013 for same complaint prescribed Valtrex and oxycodone. She completed a course of Valtrex she has run out of oxycodone. Pain the pain became worse today when she had a coughing fit however he only coughed briefly, no shortness of breath. No fever no other associated symptoms. Past Medical History  Diagnosis Date  . 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 Hypertension   . Colon cancer   . Asthma   . DVT (deep venous thrombosis)     right jugular  . Anemia   . Arthritis   . Clotting disorder   . Seizures   . PONV (postoperative nausea and vomiting)   . Spinal headache    Past Surgical History  Procedure Laterality Date  . Abdominal hysterectomy  2012  . Small intestine surgery    . Colonoscopy    . Portacath placement    . Colon surgery    . Radiofrequency ablation liver tumor     Family History  Problem Relation Age of Onset  . Hypertension Father   . Cancer Brother     colon  . Cancer Daughter     colon  . Breast cancer Sister    History  Substance Use Topics  . Smoking status: Never Smoker   . Smokeless tobacco: Never Used  . Alcohol Use: No   OB History   Grav Para Term Preterm Abortions TAB SAB Ect Mult Living                 Review of Systems  Genitourinary: Positive for flank pain.  Skin: Positive for rash.    Allergies  Review of patient's allergies indicates no known allergies.  Home Medications   Current Outpatient Rx  Name  Route  Sig  Dispense  Refill  . albuterol (PROVENTIL HFA;VENTOLIN HFA) 108 (90 BASE) MCG/ACT inhaler  Inhalation   Inhale 2 puffs into the lungs every 6 (six) hours as needed. WHEEZING           . aspirin 81 MG tablet   Oral   Take 81 mg by mouth daily.         . Azilsartan-Chlorthalidone (EDARBYCLOR) 40-25 MG TABS   Oral   Take 1 tablet by mouth daily.         . cloNIDine (CATAPRES) 0.1 MG tablet   Oral   Take 0.1 mg by mouth 2 (two) times daily.           Marland Kitchen diltiazem (CARDIZEM CD) 180 MG 24 hr capsule   Oral   Take 180 mg by mouth every morning.          . ferrous gluconate (FERGON) 325 MG tablet   Oral   Take 325 mg by mouth 3 (three) times daily with meals.           . Fluticasone-Salmeterol (ADVAIR) 100-50 MCG/DOSE AEPB   Inhalation   Inhale 2 puffs into the lungs 2 (two) times daily as needed. WHEEZING          . gabapentin (NEURONTIN) 300 MG capsule   Oral  Take 1 capsule (300 mg total) by mouth at bedtime. If you tolerate the night time dose, you can increase to twice a day   30 capsule   1   . glycerin adult (GLYCERIN ADULT) 2 G SUPP   Rectal   Place 1 suppository rectally once as needed for moderate constipation.   10 suppository   0   . HYDROcodone-acetaminophen (NORCO) 7.5-325 MG per tablet   Oral   Take 1 tablet by mouth every 4 (four) hours as needed (for pain).   30 tablet   0   . ibuprofen (ADVIL,MOTRIN) 400 MG tablet   Oral   Take 400 mg by mouth every 8 (eight) hours as needed. PAIN          . labetalol (NORMODYNE) 100 MG tablet   Oral   Take 200 mg by mouth 2 (two) times daily.          Marland Kitchen levETIRAcetam (KEPPRA) 250 MG tablet   Oral   Take 250 mg by mouth every 12 (twelve) hours.           . Multiple Vitamin (MULTIVITAMIN) tablet   Oral   Take 1 tablet by mouth daily.           . nebivolol (BYSTOLIC) 10 MG tablet   Oral   Take 10 mg by mouth daily as needed. Take if  SBP > 145  .  Take at  Bedtime.         Marland Kitchen oxyCODONE-acetaminophen (ROXICET) 5-325 MG per tablet   Oral   Take 1 tablet by mouth every 6  (six) hours as needed for severe pain.   30 tablet   0   . polyethylene glycol powder (GLYCOLAX/MIRALAX) powder   Oral   Take 255 g (1 Container total) by mouth 3 (three) times daily.   255 g   0   . potassium chloride SA (K-DUR,KLOR-CON) 20 MEQ tablet   Oral   Take 20 mEq by mouth 3 (three) times daily.          . temazepam (RESTORIL) 15 MG capsule   Oral   Take 15 mg by mouth at bedtime as needed. SLEEP          . valACYclovir (VALTREX) 1000 MG tablet   Oral   Take 1 tablet (1,000 mg total) by mouth 3 (three) times daily.   21 tablet   0    BP 126/65  Pulse 18  Temp(Src) 98 F (36.7 C) (Oral)  Resp 18  SpO2 98% Physical Exam  Nursing note and vitals reviewed. Constitutional: She appears well-developed and well-nourished.  HENT:  Head: Normocephalic and atraumatic.  Eyes: Conjunctivae are normal. Pupils are equal, round, and reactive to light.  Neck: Neck supple. No tracheal deviation present. No thyromegaly present.  Cardiovascular: Normal rate and regular rhythm.   No murmur heard. Pulmonary/Chest: Effort normal and breath sounds normal.  Abdominal: Soft. Bowel sounds are normal. She exhibits no distension. There is no tenderness.  Obese  Musculoskeletal: Normal range of motion. She exhibits no edema and no tenderness.  No point tenderness along spine.  Neurological: She is alert. Coordination normal.  Skin: Skin is warm and dry. No rash noted.  Herpetiform rash, and tried in a dermatomal fashion at left flank  Psychiatric: She has a normal mood and affect.    ED Course  Procedures (including critical care time) Labs Review Labs Reviewed - No data to display Imaging Review Dg Thoracic Spine 4v  04/04/2013   CLINICAL DATA:  Back pain  EXAM: THORACIC SPINE - 4+ VIEW  COMPARISON:  06/19/2012  FINDINGS: No compression deformities are identified. Mild osteophytic changes are seen. The pedicles are within normal limits and no paraspinal mass lesion is seen.  The visualized rib cage is unremarkable.  IMPRESSION: Degenerative change without acute abnormality.   Electronically Signed   By: Alcide Clever M.D.   On: 04/04/2013 12:44   Dg Lumbar Spine Complete  04/04/2013   CLINICAL DATA:  Pain.  EXAM: LUMBAR SPINE - COMPLETE 4+ VIEW  COMPARISON:  CT 03/26/2013.  FINDINGS: Diffuse degenerative changes lumbar spine. Degenerative changes both SI joints. Diffuse osteopenia. No acute abnormality identified.  IMPRESSION: Diffuse severe osteopenia. Diffuse degenerative changes lumbar spine and both SI joints. No acute abnormality.   Electronically Signed   By: Maisie Fus  Register   On: 04/04/2013 12:10    EKG Interpretation   None       MDM  No diagnosis found.  Patient has continued pain from shingles, and postherpetic neuralgia. Plan prescription Percocet. Followup with Dr. Petra Kuba in the office as needed. She does not need further diagnostic studies or imaging. Diagnosis shingles    Doug Sou, MD 04/04/13 [phone]

## 2013-04-04 NOTE — ED Notes (Signed)
Pt reports having left side lower back pain. Was here on 12/22 and given pain meds, also had recent shingles on left side. Has followed up with pcp and went for lumbar spine xrays done today but is still having pain and out of pain meds.

## 2013-04-07 IMAGING — PT NM PET TUM IMG RESTAG (PS) SKULL BASE T - THIGH
6 series · 25 of 25 positions shown · non-contrast
Comparison: PET of 04/27/2010.  Diagnostic CTs chest, abdomen, and
pelvis of 02/16/2011.

CLINICAL DATA: Subsequent treatment strategy for history of colon
cancer and breast mass.  Retroperitoneal adenopathy. Chemotherapy
in 9477.

NUCLEAR MEDICINE PET CT SKULL BASE TO THIGH
TECHNIQUE: 17.6 mCi F-18 FDG was injected intravenously via the
left AC.  Full-ring PET imaging was performed from the skull base
through the mid-thighs 95  minutes after injection.  CT data was
obtained and used for attenuation correction and anatomic
localization only.  (This was not acquired as a diagnostic CT
examination.)
Fasting Blood Glucose:  84

[Series 1: pet ac · axial · 3.3mm · 4.69mm/px · z∈[-876,-6]mm · 5 of 267 slices shown]
[im 1/267]
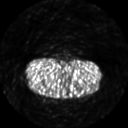
[im 67/267]
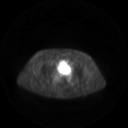
[im 134/267]
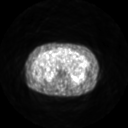
[im 200/267]
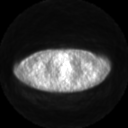
[im 267/267]
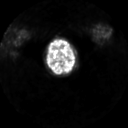

[Series 2: pet nac · axial · 3.3mm · 4.69mm/px · z∈[-876,-6]mm · 6 of 267 slices shown]
[im 1/267]
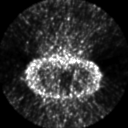
[im 54/267]
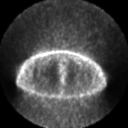
[im 107/267]
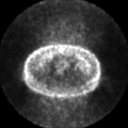
[im 160/267]
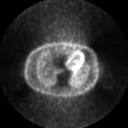
[im 213/267]
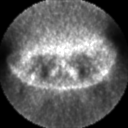
[im 267/267]
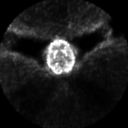

[Series 2: ct images · axial · 3.8mm · 0.98mm/px · z∈[-876,-6]mm · 5 of 266 slices shown]
[im 1/266]
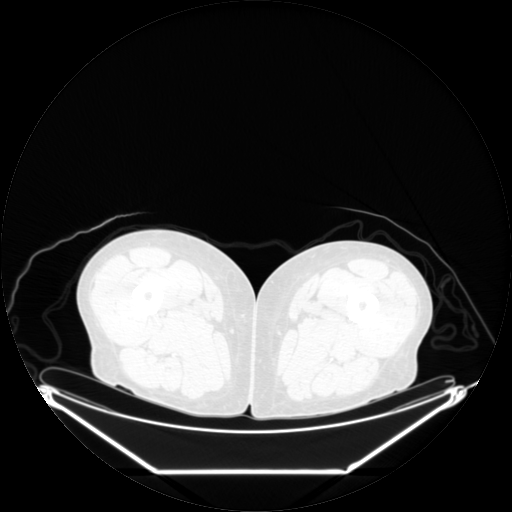
[im 67/266]
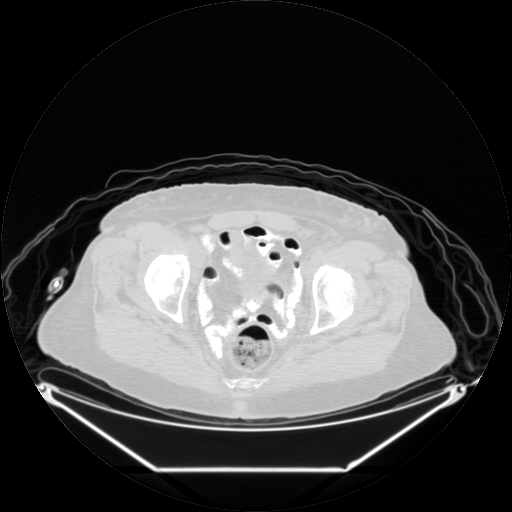
[im 133/266]
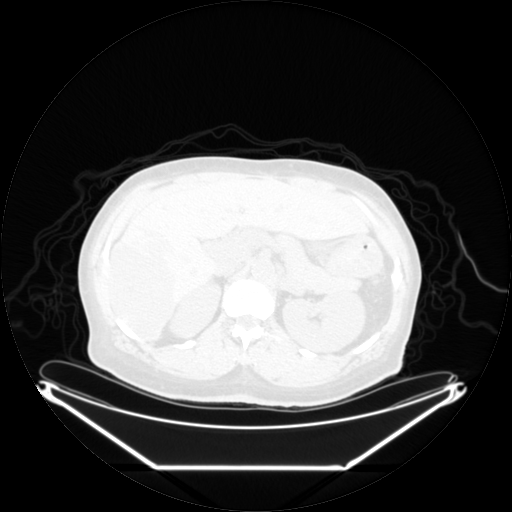
[im 199/266]
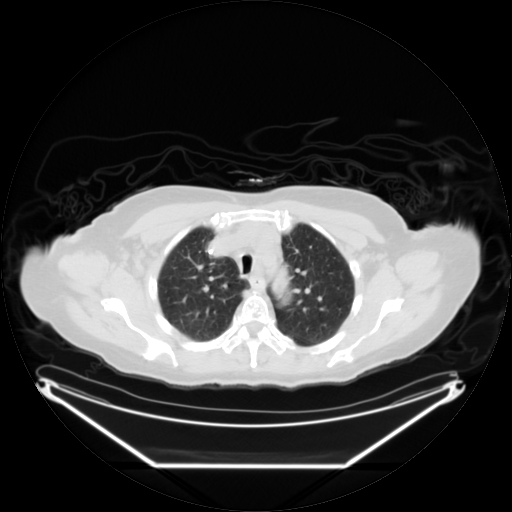
[im 266/266  brain]
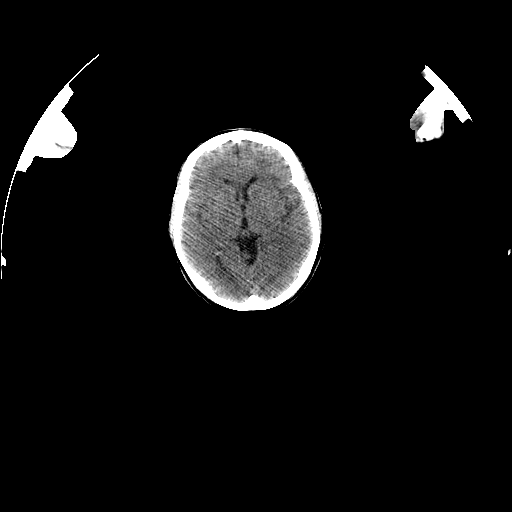

[Series 123: mip · coronal · 3.3mm · 4.69mm/px · 1 of 30 slices shown]
[im 1/30]
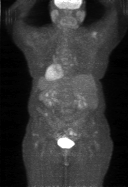

[Series 151: reformatted · axial · 3.3mm · 3.91mm/px · z∈[-876,-6]mm · 6 of 265 slices shown (1 of 2)]
[im 1/265]
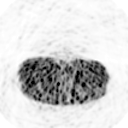
[im 53/265]
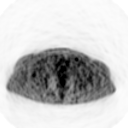
[im 106/265]
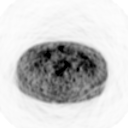
[im 159/265]
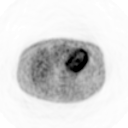
[im 212/265]
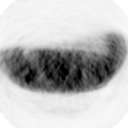
[im 265/265]
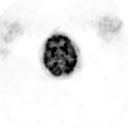

[Series 153: reformatted · coronal · 4.7mm · 6.98mm/px · 2 of 77 slices shown (2 of 2)]
[im 1/77]
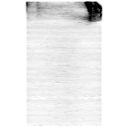
[im 77/77]
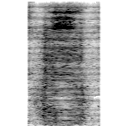

[25 of 25 positions shown; findings below may reference images not displayed]

FINDINGS: PET images demonstrate no abnormal activity within the
neck.  Right glenohumeral joint degenerative hypermetabolism.

Thoracic nodes which demonstrate mild low level hypermetabolism.
Index left periaortic node measures 1.0 cm and a S.U.V. max of
on image 76.  Similar in size on the prior PET.  Azygo-esophageal
recess node measures 6 mm a S.U.V. max of 3.2 on image 93.  This
node was 1.3 cm on the prior.

Left abdominal para-aortic node measures 1.2 cm and a S.U.V. max of
2.7 on image 154.

A focus of hypermetabolism immediately anterior to surgical sutures
in the descending colon is favored to be physiologic, and is
without correlate on approximately image 163.

Right external iliac node measures 6 mm a S.U.V. max of 3.0 on
image 193.  This node measured 1.0 cm,  a S.U.V. max of 4.6 on the
prior PET.

Right inguinal node measures 1.1 cm and a S.U.V. max of 1.8 on
image 232.  Similar in size on the prior.

Redemonstration of hypermetabolic nodules about the anterior
abdominal pelvic wall.  Example image 195.  These measure a S.U.V.
max of 2.9.

CT images performed for attenuation correction demonstrate no
significant findings within the neck.  Chest, abdomen, and pelvic
findings deferred to recent diagnostic CTs.  Right lower lobe giant
angioma has been detailed previously.
IMPRESSION: 1.  The abdominal pelvic nodes demonstrate relatively low level
mild hypermetabolism.  Not typical of colon cancer metastasis,
which cannot be entirely excluded.  Considerations include
infectious or inflammatory granulomatous processes or low grade
lymphoproliferative process.
2.  Thoracic nodes which also demonstrate low level
hypermetabolism, similar to on the prior exam.  Favored to be
reactive.
3.  No typical findings of colon cancer metastasis.
4.  Probable injection sites about the anterior abdominal pelvic
wall.

## 2013-04-14 ENCOUNTER — Encounter (HOSPITAL_COMMUNITY): Payer: Self-pay | Admitting: Emergency Medicine

## 2013-04-14 ENCOUNTER — Emergency Department (HOSPITAL_COMMUNITY)
Admission: EM | Admit: 2013-04-14 | Discharge: 2013-04-14 | Disposition: A | Discharge status: Home / Self Care | Payer: Medicare Other | Attending: Emergency Medicine | Admitting: Emergency Medicine

## 2013-04-14 DIAGNOSIS — I10 Essential (primary) hypertension: Secondary | ICD-10-CM | POA: Insufficient documentation

## 2013-04-14 DIAGNOSIS — Z7982 Long term (current) use of aspirin: Secondary | ICD-10-CM | POA: Insufficient documentation

## 2013-04-14 DIAGNOSIS — J45909 Unspecified asthma, uncomplicated: Secondary | ICD-10-CM | POA: Insufficient documentation

## 2013-04-14 DIAGNOSIS — B0229 Other postherpetic nervous system involvement: Secondary | ICD-10-CM | POA: Insufficient documentation

## 2013-04-14 DIAGNOSIS — Z86718 Personal history of other venous thrombosis and embolism: Secondary | ICD-10-CM | POA: Insufficient documentation

## 2013-04-14 DIAGNOSIS — E559 Vitamin D deficiency, unspecified: Secondary | ICD-10-CM | POA: Insufficient documentation

## 2013-04-14 DIAGNOSIS — Z8505 Personal history of malignant neoplasm of liver: Secondary | ICD-10-CM | POA: Insufficient documentation

## 2013-04-14 DIAGNOSIS — Z85038 Personal history of other malignant neoplasm of large intestine: Secondary | ICD-10-CM | POA: Insufficient documentation

## 2013-04-14 DIAGNOSIS — Z79899 Other long term (current) drug therapy: Secondary | ICD-10-CM | POA: Insufficient documentation

## 2013-04-14 MED ORDER — HYDROMORPHONE HCL PF 1 MG/ML IJ SOLN
2.0000 mg | Freq: Once | INTRAMUSCULAR | Status: AC
  Administered 2013-04-14: 2 mg via INTRAMUSCULAR
  Filled 2013-04-14: qty 2

## 2013-04-14 MED ORDER — OXYCODONE-ACETAMINOPHEN 5-325 MG PO TABS: 2.0000 | ORAL_TABLET | ORAL | Status: DC | PRN

## 2013-04-14 MED ORDER — GABAPENTIN 300 MG PO CAPS: 300.0000 mg | ORAL_CAPSULE | Freq: Every day | ORAL | Status: DC

## 2013-04-14 MED ORDER — PREDNISONE 10 MG PO TABS: 20.0000 mg | ORAL_TABLET | Freq: Two times a day (BID) | ORAL | Status: DC

## 2013-04-14 NOTE — Discharge Instructions (Signed)
Prednisone as prescribed. Continue your Neurontin as prescribed. Percocet as needed for pain.  Followup with your primary Dr. if not improving in the next several days.  Postherpetic Neuralgia Shingles is a painful disease. It is caused by the herpes zoster virus. This is the same virus which also causes chickenpox. It can affect the torso, limbs, or the face. For most people, shingles is a condition of rather sudden onset. Pain usually lasts about 1 month. In older patients, or patients with poor immune systems, a painful, long-standing (chronic) condition called postherpetic neuralgia can develop. This condition rarely happens before age 75. But at least 50% of people over 50 become affected following an attack of shingles. There is a natural tendency for this condition to improve over time with no treatment. Less than 5% of patients have pain that lasts for more than 1 year. DIAGNOSIS  Herpes is usually easily diagnosed on physical exam. Pain sometimes follows when the skin sores (lesions) have disappeared. It is called postherpetic neuralgia. That name simply means the pain that follows herpes. TREATMENT   Treating this condition may be difficult. Usually one of the tricyclic antidepressants, often amitriptyline, is the first line of treatment. There is evidence that the sooner these medications are given, the more likely they are to reduce pain.  Conventional analgesics, regional nerve blocks, and anticonvulsants have little benefit in most cases when used alone. Other tricyclic anti-depressants are used as a second option if the first antidepressant is unsuccessful.  Anticonvulsants, including carbamazepine, have been found to provide some added benefit when used with a tricyclic anti-depressant. This is especially for the stabbing type of pain similar to that of trigeminal neuralgia.  Chronic opioid therapy. This is a strong narcotic pain medication. It is used to treat pain that is  resistant to other measures. The issues of dependency and tolerance can be reduced with closely managed care.  Some cream treatments are applied locally to the affected area. They can help when used with other treatments. Their use may be difficult in the case of postherpetic trigeminal neuralgia. This is involved with the face. So the substances can irritate the eye and the skin around the eye. Examples of creams used include Capsaicin and lidocaine creams.  For shingles, antiviral therapies along with analgesics are recommended. Studies of the effect of anti-viral agents such as acyclovir on shingles have been done. They show improved rates of healing and decreased severity of sudden (acute) pain. Some observations suggest that nerve blocks during shingles infection will:  Reduce pain.  Shorten the acute episode.  Prevent the emergence of postherpetic neuralgia. Viral medications used include Acyclovir (Zovirax), Valacyclovir, Famciclovir and a lysine diet. Document Released: 06/12/2002 Document Revised: 06/14/2011 Document Reviewed: 03/22/2005 [facility] Patient Information 2014 [place], [place].

## 2013-04-14 NOTE — ED Provider Notes (Signed)
CSN: [id number]     Arrival date & time 04/14/13  0757 History   First MD Initiated Contact with Patient 04/14/13 [phone]     Chief Complaint  Patient presents with  . Back Pain   (Consider location/radiation/quality/duration/timing/severity/associated sxs/prior Treatment) HPI Comments: Patient is a 68 year old female with history of colon cancer, hypertension, DVT. She presents today with complaints of pain in her left flank and side that has been present for the past month. She was diagnosed with a shingles and was treated with acyclovir. She is having ongoing pain related to this. She denies me she is having any chest pain or difficulty breathing. She denies any fevers or chills. She denies any fever or stiff neck.  She has been seen here multiple times for similar complaints but states that she is not getting any better. She is out of her pain medication and has one dose of her Neurontin left.  Patient is a 68 y.o. female presenting with back pain. The history is provided by the patient.  Back Pain Pain location: Left flank. Quality:  Stabbing Radiates to:  Does not radiate Pain severity:  Severe Pain is:  Same all the time Onset quality:  Gradual Duration:  1 month Timing:  Constant Progression:  Unchanged Chronicity:  New Context: not falling   Relieved by:  Nothing   Past Medical History  Diagnosis Date  . 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 Hypertension   . Colon cancer   . Asthma   . DVT (deep venous thrombosis)     right jugular  . Anemia   . Arthritis   . Clotting disorder   . Seizures   . PONV (postoperative nausea and vomiting)   . Spinal headache    Past Surgical History  Procedure Laterality Date  . Abdominal hysterectomy  2012  . Small intestine surgery    . Colonoscopy    . Portacath placement    . Colon surgery    . Radiofrequency ablation liver tumor     Family History  Problem Relation Age of Onset  . Hypertension Father    . Cancer Brother     colon  . Cancer Daughter     colon  . Breast cancer Sister    History  Substance Use Topics  . Smoking status: Never Smoker   . Smokeless tobacco: Never Used  . Alcohol Use: No   OB History   Grav Para Term Preterm Abortions TAB SAB Ect Mult Living                 Review of Systems  Musculoskeletal: Positive for back pain.  All other systems reviewed and are negative.    Allergies  Review of patient's allergies indicates no known allergies.  Home Medications   Current Outpatient Rx  Name  Route  Sig  Dispense  Refill  . aspirin 81 MG tablet   Oral   Take 81 mg by mouth daily.         . Azilsartan-Chlorthalidone (EDARBYCLOR) 40-25 MG TABS   Oral   Take 1 tablet by mouth daily.         . cloNIDine (CATAPRES) 0.1 MG tablet   Oral   Take 0.1 mg by mouth 2 (two) times daily.           Marland Kitchen diltiazem (CARDIZEM CD) 180 MG 24 hr capsule   Oral   Take 180 mg by mouth every morning.          Marland Kitchen  ferrous gluconate (FERGON) 325 MG tablet   Oral   Take 325 mg by mouth 3 (three) times daily with meals.           . Fluticasone-Salmeterol (ADVAIR) 100-50 MCG/DOSE AEPB   Inhalation   Inhale 2 puffs into the lungs 2 (two) times daily as needed. WHEEZING          . gabapentin (NEURONTIN) 300 MG capsule   Oral   Take 1 capsule (300 mg total) by mouth at bedtime. If you tolerate the night time dose, you can increase to twice a day   30 capsule   1   . glycerin adult (GLYCERIN ADULT) 2 G SUPP   Rectal   Place 1 suppository rectally once as needed for moderate constipation.   10 suppository   0   . HYDROcodone-acetaminophen (NORCO) 7.5-325 MG per tablet   Oral   Take 1 tablet by mouth every 4 (four) hours as needed (for pain).   30 tablet   0   . ibuprofen (ADVIL,MOTRIN) 400 MG tablet   Oral   Take 400 mg by mouth every 8 (eight) hours as needed. PAIN          . labetalol (NORMODYNE) 100 MG tablet   Oral   Take 200 mg by mouth  2 (two) times daily.          Marland Kitchen levETIRAcetam (KEPPRA) 250 MG tablet   Oral   Take 250 mg by mouth every 12 (twelve) hours.          . Multiple Vitamin (MULTIVITAMIN) tablet   Oral   Take 1 tablet by mouth daily.           . nebivolol (BYSTOLIC) 10 MG tablet   Oral   Take 10 mg by mouth at bedtime. Take if  SBP > 145  .  Take at  Bedtime.         Marland Kitchen oxyCODONE-acetaminophen (PERCOCET) 5-325 MG per tablet   Oral   Take 1 tablet by mouth every 6 (six) hours as needed.   40 tablet   0   . polyethylene glycol powder (GLYCOLAX/MIRALAX) powder   Oral   Take 255 g (1 Container total) by mouth 3 (three) times daily.   255 g   0   . potassium chloride SA (K-DUR,KLOR-CON) 20 MEQ tablet   Oral   Take 20 mEq by mouth 3 (three) times daily.          . temazepam (RESTORIL) 15 MG capsule   Oral   Take 15 mg by mouth at bedtime as needed. SLEEP          . albuterol (PROVENTIL HFA;VENTOLIN HFA) 108 (90 BASE) MCG/ACT inhaler   Inhalation   Inhale 2 puffs into the lungs every 6 (six) hours as needed. WHEEZING           . valACYclovir (VALTREX) 1000 MG tablet   Oral   Take 1 tablet (1,000 mg total) by mouth 3 (three) times daily.   21 tablet   0    BP 188/106  Pulse 86  Temp(Src) 98 F (36.7 C) (Oral)  Resp 22  SpO2 96% Physical Exam  Nursing note and vitals reviewed. Constitutional: She is oriented to person, place, and time. She appears well-developed and well-nourished. No distress.  HENT:  Head: Normocephalic and atraumatic.  Neck: Normal range of motion. Neck supple.  Cardiovascular: Normal rate and regular rhythm.  Exam reveals no gallop and no friction rub.  No murmur heard. Pulmonary/Chest: Effort normal and breath sounds normal. No respiratory distress. She has no wheezes.  Abdominal: Soft. Bowel sounds are normal. She exhibits no distension. There is no tenderness.  Musculoskeletal: Normal range of motion.  Neurological: She is alert and oriented to  person, place, and time.  Skin: Skin is warm and dry. She is not diaphoretic.  There is an area on the left flank of healing vesicular lesions in a dermatomal pattern. Palpation of this area exacerbates her symptoms.    ED Course  Procedures (including critical care time) Labs Review Labs Reviewed - No data to display Imaging Review No results found.    MDM  No diagnosis found. This appears to be a postherpetic neuralgia related to a recent case of shingles. I will refill her pain medication and Neurontin. I will also prescribe prednisone. She is to followup with her primary Dr. if not improving in the next several days to discuss referral for possible nerve block.    Veryl Speak, MD 04/14/13 [phone]

## 2013-04-14 NOTE — ED Notes (Addendum)
Pt from home c/o back pain.  Pt has been seen here previously for the same.  Pt is requesting to see her PCP, Dr. Katherine Roan.  Pt states she called trying to get an appointment with him sooner than the 27th but the office would not give her one.  Pt states "he sees pts in the hospital and this is an emergency."  Pt in NAD, A&O.

## 2013-05-10 ENCOUNTER — Encounter (HOSPITAL_COMMUNITY): Payer: Self-pay | Admitting: Emergency Medicine

## 2013-05-10 ENCOUNTER — Emergency Department (HOSPITAL_COMMUNITY)
Admission: EM | Admit: 2013-05-10 | Discharge: 2013-05-10 | Disposition: A | Discharge status: Home / Self Care | Payer: Medicare Other | Attending: Emergency Medicine | Admitting: Emergency Medicine

## 2013-05-10 DIAGNOSIS — Z8679 Personal history of other diseases of the circulatory system: Secondary | ICD-10-CM | POA: Insufficient documentation

## 2013-05-10 DIAGNOSIS — R112 Nausea with vomiting, unspecified: Secondary | ICD-10-CM | POA: Insufficient documentation

## 2013-05-10 DIAGNOSIS — Z79899 Other long term (current) drug therapy: Secondary | ICD-10-CM | POA: Insufficient documentation

## 2013-05-10 DIAGNOSIS — I10 Essential (primary) hypertension: Secondary | ICD-10-CM | POA: Insufficient documentation

## 2013-05-10 DIAGNOSIS — J45909 Unspecified asthma, uncomplicated: Secondary | ICD-10-CM | POA: Insufficient documentation

## 2013-05-10 DIAGNOSIS — IMO0002 Reserved for concepts with insufficient information to code with codable children: Secondary | ICD-10-CM | POA: Insufficient documentation

## 2013-05-10 DIAGNOSIS — B0229 Other postherpetic nervous system involvement: Secondary | ICD-10-CM | POA: Insufficient documentation

## 2013-05-10 DIAGNOSIS — M129 Arthropathy, unspecified: Secondary | ICD-10-CM | POA: Insufficient documentation

## 2013-05-10 DIAGNOSIS — R569 Unspecified convulsions: Secondary | ICD-10-CM | POA: Insufficient documentation

## 2013-05-10 DIAGNOSIS — G971 Other reaction to spinal and lumbar puncture: Secondary | ICD-10-CM | POA: Insufficient documentation

## 2013-05-10 DIAGNOSIS — Z7982 Long term (current) use of aspirin: Secondary | ICD-10-CM | POA: Insufficient documentation

## 2013-05-10 DIAGNOSIS — M549 Dorsalgia, unspecified: Secondary | ICD-10-CM | POA: Insufficient documentation

## 2013-05-10 DIAGNOSIS — D649 Anemia, unspecified: Secondary | ICD-10-CM | POA: Insufficient documentation

## 2013-05-10 DIAGNOSIS — B029 Zoster without complications: Secondary | ICD-10-CM | POA: Insufficient documentation

## 2013-05-10 DIAGNOSIS — C787 Secondary malignant neoplasm of liver and intrahepatic bile duct: Secondary | ICD-10-CM | POA: Insufficient documentation

## 2013-05-10 DIAGNOSIS — C189 Malignant neoplasm of colon, unspecified: Secondary | ICD-10-CM | POA: Insufficient documentation

## 2013-05-10 DIAGNOSIS — R197 Diarrhea, unspecified: Secondary | ICD-10-CM | POA: Insufficient documentation

## 2013-05-10 DIAGNOSIS — R21 Rash and other nonspecific skin eruption: Secondary | ICD-10-CM | POA: Insufficient documentation

## 2013-05-10 DIAGNOSIS — D689 Coagulation defect, unspecified: Secondary | ICD-10-CM | POA: Insufficient documentation

## 2013-05-10 HISTORY — DX: Zoster without complications: B02.9

## 2013-05-10 MED ORDER — HYDROMORPHONE HCL PF 1 MG/ML IJ SOLN
1.0000 mg | Freq: Once | INTRAMUSCULAR | Status: AC
  Administered 2013-05-10: 1 mg via INTRAMUSCULAR
  Filled 2013-05-10: qty 1

## 2013-05-10 MED ORDER — GABAPENTIN 100 MG PO CAPS
200.0000 mg | ORAL_CAPSULE | Freq: Once | ORAL | Status: AC
  Administered 2013-05-10: 200 mg via ORAL
  Filled 2013-05-10: qty 2

## 2013-05-10 MED ORDER — HYDROMORPHONE HCL PF 1 MG/ML IJ SOLN: 0.5000 mg | Freq: Once | INTRAMUSCULAR | Status: DC

## 2013-05-10 MED ORDER — FENTANYL 25 MCG/HR TD PT72: 25.0000 ug | MEDICATED_PATCH | TRANSDERMAL | Status: DC

## 2013-05-10 MED ORDER — LIDOCAINE HCL 2 % EX GEL: 1.0000 "application " | CUTANEOUS | Status: DC | PRN

## 2013-05-10 NOTE — ED Provider Notes (Signed)
CSN: [id number]     Arrival date & time 05/10/13  1743 History  This chart was scribed for non-physician practitioner Abigail Butts, PA-C working with Carmin Muskrat, MD by Zettie Pho, ED Scribe. This patient was seen in room TR08C/TR08C and the patient's care was started at 7:09 PM.    Chief Complaint  Patient presents with  . Abdominal Pain   The history is provided by the patient and medical records. No language interpreter was used.   HPI Comments: [name] is a 68 y.o. female who presents to the Emergency Department complaining of a painful rash to the left side of the abdomen secondary to her recent diagnosis of Shingles over a month ago that has been progressively worsening. She reports an associated, shooting back to the left side of the back that radiates around to the abdomen. She states that the pain is exacerbated with laying down. She reports one episode of soft stool earlier today that was non-bloody. Patient was has been seen by her PCP for these complaints and was given a nerve block, which she states provided only temporary relief, and a prescription for oxycodone, which she states has not been able to provide significant relief. Patient has been seen here multiple times in the past 1-1.5 months for similar complaints and was discharged with Percocet and gabapentin, which she states has also not been effective at providing relief. She denies nausea, emesis, dysuria. She denies any allergies to medications. Patient also has a history of colon cancer, HTN, asthma, and DVT.   Past Medical History  Diagnosis Date  . 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 Hypertension   . Colon cancer   . Asthma   . DVT (deep venous thrombosis)     right jugular  . Anemia   . Arthritis   . Clotting disorder   . Seizures   . PONV (postoperative nausea and vomiting)   . Spinal headache   . Shingles rash    Past Surgical History  Procedure Laterality Date   . Abdominal hysterectomy  2012  . Small intestine surgery    . Colonoscopy    . Portacath placement    . Colon surgery    . Radiofrequency ablation liver tumor     Family History  Problem Relation Age of Onset  . Hypertension Father   . Cancer Brother     colon  . Cancer Daughter     colon  . Breast cancer Sister    History  Substance Use Topics  . Smoking status: Never Smoker   . Smokeless tobacco: Never Used  . Alcohol Use: No   OB History   Grav Para Term Preterm Abortions TAB SAB Ect Mult Living                 Review of Systems  Constitutional: Negative for fever, diaphoresis, appetite change, fatigue and unexpected weight change.  HENT: Negative for mouth sores.   Eyes: Negative for visual disturbance.  Respiratory: Negative for cough, chest tightness, shortness of breath and wheezing.   Cardiovascular: Negative for chest pain.  Gastrointestinal: Positive for diarrhea. Negative for nausea, vomiting, abdominal pain, constipation and blood in stool.  Endocrine: Negative for polydipsia, polyphagia and polyuria.  Genitourinary: Negative for dysuria, urgency, frequency and hematuria.  Musculoskeletal: Positive for back pain. Negative for neck stiffness.  Skin: Positive for rash (painful).  Allergic/Immunologic: Negative for immunocompromised state.  Neurological: Negative for syncope, light-headedness  and headaches.  Hematological: Does not bruise/bleed easily.  Psychiatric/Behavioral: Negative for sleep disturbance. The patient is not nervous/anxious.     Allergies  Review of patient's allergies indicates no known allergies.  Home Medications   Current Outpatient Rx  Name  Route  Sig  Dispense  Refill  . albuterol (PROVENTIL HFA;VENTOLIN HFA) 108 (90 BASE) MCG/ACT inhaler   Inhalation   Inhale 2 puffs into the lungs every 6 (six) hours as needed for wheezing or shortness of breath.          Marland Kitchen aspirin EC 81 MG tablet   Oral   Take 81 mg by mouth daily.          . Azilsartan-Chlorthalidone (EDARBYCLOR) 40-25 MG TABS   Oral   Take 1 tablet by mouth daily.         . cloNIDine (CATAPRES) 0.1 MG tablet   Oral   Take 0.1 mg by mouth 2 (two) times daily.           Marland Kitchen diltiazem (CARDIZEM CD) 180 MG 24 hr capsule   Oral   Take 180 mg by mouth every morning.          . ferrous sulfate 325 (65 FE) MG tablet   Oral   Take 325 mg by mouth 3 (three) times daily with meals.         . Fluticasone-Salmeterol (ADVAIR) 100-50 MCG/DOSE AEPB   Inhalation   Inhale 2 puffs into the lungs 2 (two) times daily as needed. WHEEZING          . gabapentin (NEURONTIN) 300 MG capsule   Oral   Take 300-600 mg by mouth at bedtime. *instructed to take 2 if needed*         . labetalol (NORMODYNE) 100 MG tablet   Oral   Take 200 mg by mouth 2 (two) times daily.          Marland Kitchen levETIRAcetam (KEPPRA) 250 MG tablet   Oral   Take 250 mg by mouth every 12 (twelve) hours.          . Multiple Vitamin (MULTIVITAMIN) tablet   Oral   Take 1 tablet by mouth daily.           . nebivolol (BYSTOLIC) 10 MG tablet   Oral   Take 10 mg by mouth at bedtime. Take if  SBP > 145  .  Take at  Bedtime.         Marland Kitchen oxyCODONE-acetaminophen (PERCOCET/ROXICET) 5-325 MG per tablet   Oral   Take 2 tablets by mouth every 4 (four) hours as needed for severe pain.         . polyethylene glycol (MIRALAX / GLYCOLAX) packet   Oral   Take 17 g by mouth 3 (three) times daily as needed for mild constipation.         . potassium chloride SA (K-DUR,KLOR-CON) 20 MEQ tablet   Oral   Take 20 mEq by mouth 3 (three) times daily.          . fentaNYL (DURAGESIC) 25 MCG/HR patch   Transdermal   Place 1 patch (25 mcg total) onto the skin every 3 (three) days.   5 patch   0    Triage Vitals: BP 153/63  Pulse 76  Temp(Src) 98.1 F (36.7 C) (Oral)  Resp 18  Ht _0  (1.575 m)  Wt 180 lb (81.647 kg)  BMI 32.91 kg/m2  SpO2 97%  Physical Exam  Nursing note  and vitals  reviewed. Constitutional: She is oriented to person, place, and time. She appears well-developed and well-nourished. She appears distressed.  Awake, alert, nontoxic appearance  HENT:  Head: Normocephalic and atraumatic.  Mouth/Throat: Oropharynx is clear and moist. No oropharyngeal exudate.  Eyes: Conjunctivae are normal. No scleral icterus.  Neck: Normal range of motion. Neck supple.  Cardiovascular: Normal rate, regular rhythm, normal heart sounds and intact distal pulses.   No murmur heard. Pulmonary/Chest: Effort normal and breath sounds normal. No respiratory distress. She has no wheezes.  Abdominal: Soft. Bowel sounds are normal. She exhibits no distension and no mass. There is no tenderness. There is no rebound and no guarding.  abd soft and nontender  Musculoskeletal: Normal range of motion. She exhibits no edema.  Scarring along the T7-T8 dermatone from a shingles infection. Significant pain to palpation of this area. No pain to palpation of the abdomen itself.   Neurological: She is alert and oriented to person, place, and time. She exhibits normal muscle tone. Coordination normal.  Speech is clear and goal oriented Moves extremities without ataxia  Skin: Skin is warm and dry. Rash noted. She is not diaphoretic.  Psychiatric: She has a normal mood and affect.    ED Course  Procedures (including critical care time)  DIAGNOSTIC STUDIES: Oxygen Saturation is 97% on room air, normal by my interpretation.    COORDINATION OF CARE: 7:15 PM- Discussed that symptoms are likely due to postherpetic neuralgia. Will order an injection of Dilaudid to manage symptoms. Will consult with Dr. Vanita Panda. Discussed treatment plan with patient at bedside and patient verbalized agreement.   8:16 PM- Dr. Vanita Panda is with patient at bedside. He recommended a fentanyl patch.   8:38 PM- Patient told the nurse that her pain has only decreased to an 8/10 after receiving the Dilaudid. She reports that  she last took her gabapentin around 9.5 hours ago. Will order a dose of gabapentin. Discussed treatment plan with patient at bedside and patient verbalized agreement.   9:12 PM- Patient reports that her pain has improved slightly. Will order an additional injection of Dilaudid. Discussed that the patient may need a fentanyl patch to manage her chronic pain and will discharge her with a prescription for this. Patient states that she takes the gabapentin 300 mg three times daily and that she has enough of her prescription left. Discussed treatment plan with patient at bedside and patient verbalized agreement.    Labs Review Labs Reviewed - No data to display Imaging Review No results found.  EKG Interpretation   None       MDM   1. Post herpetic neuralgia     [name] presents with postherpetic neuralgia pain.  Pain is worsened with palpation to the area. Patient without urinary symptoms, nausea or vomiting, diarrhea or abdominal pain itself.  Patient reports only mild relief with Percocet and gabapentin.  Patient pain control here.  Discussed with patient and daughter the potential for switching her pain control.  Will recommend fentanyl patches, low dose at 25 mcg.  I discussed at length my concerns for increased fall risk with this medication.  Patient and daughter state understanding.  Also recommend Lidoderm patches and other possible pain treatments including acupuncture.    Patient is alert, oriented, nontoxic and nonseptic appearing. Recommend close followup with her primary care physician for alternative treatment options.  It has been determined that no acute conditions requiring further emergency intervention are present at this time. The patient/guardian have  been advised of the diagnosis and plan. We have discussed signs and symptoms that warrant return to the ED, such as changes or worsening in symptoms.   Vital signs are stable at discharge.   BP 152/67  Pulse  69  Temp(Src) 98.1 F (36.7 C) (Oral)  Resp 18  Ht _0  (1.575 m)  Wt 180 lb (81.647 kg)  BMI 32.91 kg/m2  SpO2 100%  Patient/guardian has voiced understanding and agreed to follow-up with the PCP or specialist.  I personally performed the services described in this documentation, which was scribed in my presence. The recorded information has been reviewed and is accurate.  The patient was discussed with and seen by Dr. Vanita Panda who agrees with the treatment plan.   Evelyn Soho Franceska Strahm, PA-C 05/10/13 2145

## 2013-05-10 NOTE — Discharge Instructions (Signed)
1. Medications: fentanyl patch, usual home medications 2. Treatment: rest, drink plenty of fluids, DO NOT USE PERCOCET WITH THE FENTANYL PATCH 3. Follow Up: Please followup with your primary doctor for discussion of your diagnoses and further evaluation after today's visit;     Postherpetic Neuralgia Shingles is a painful disease. It is caused by the herpes zoster virus. This is the same virus which also causes chickenpox. It can affect the torso, limbs, or the face. For most people, shingles is a condition of rather sudden onset. Pain usually lasts about 1 month. In older patients, or patients with poor immune systems, a painful, long-standing (chronic) condition called postherpetic neuralgia can develop. This condition rarely happens before age 39. But at least 50% of people over 50 become affected following an attack of shingles. There is a natural tendency for this condition to improve over time with no treatment. Less than 5% of patients have pain that lasts for more than 1 year. DIAGNOSIS  Herpes is usually easily diagnosed on physical exam. Pain sometimes follows when the skin sores (lesions) have disappeared. It is called postherpetic neuralgia. That name simply means the pain that follows herpes. TREATMENT   Treating this condition may be difficult. Usually one of the tricyclic antidepressants, often amitriptyline, is the first line of treatment. There is evidence that the sooner these medications are given, the more likely they are to reduce pain.  Conventional analgesics, regional nerve blocks, and anticonvulsants have little benefit in most cases when used alone. Other tricyclic anti-depressants are used as a second option if the first antidepressant is unsuccessful.  Anticonvulsants, including carbamazepine, have been found to provide some added benefit when used with a tricyclic anti-depressant. This is especially for the stabbing type of pain similar to that of trigeminal  neuralgia.  Chronic opioid therapy. This is a strong narcotic pain medication. It is used to treat pain that is resistant to other measures. The issues of dependency and tolerance can be reduced with closely managed care.  Some cream treatments are applied locally to the affected area. They can help when used with other treatments. Their use may be difficult in the case of postherpetic trigeminal neuralgia. This is involved with the face. So the substances can irritate the eye and the skin around the eye. Examples of creams used include Capsaicin and lidocaine creams.  For shingles, antiviral therapies along with analgesics are recommended. Studies of the effect of anti-viral agents such as acyclovir on shingles have been done. They show improved rates of healing and decreased severity of sudden (acute) pain. Some observations suggest that nerve blocks during shingles infection will:  Reduce pain.  Shorten the acute episode.  Prevent the emergence of postherpetic neuralgia. Viral medications used include Acyclovir (Zovirax), Valacyclovir, Famciclovir and a lysine diet. Document Released: 06/12/2002 Document Revised: 06/14/2011 Document Reviewed: 03/22/2005 [facility] Patient Information 2014 [place], [place].  There is no single treatment that relieves postherpetic neuralgia in all people. In many cases, it may take a combination of treatments to reduce the pain.  Lidocaine skin patches  These are small, bandage-like patches that contain the topical, pain-relieving medication lidocaine. These patches can be cut to fit only the affected area. You apply the patches, available by prescription, directly to painful skin to deliver temporary relief.  Capsaicin skin patches  These patches contain a very high concentration of an extract of chili peppers (capsaicin), which can be effective at relieving the nerve pain of postherpetic neuralgia. Capsaicin is available as a low-concentration cream  over-the-counter and can improve pain over several weeks if the application is tolerated -- it causes a burning sensation in many people. The capsaicin skin patch is a much higher concentration and is given only in your doctor's office by trained personnel after first applying a numbing medication to the affected area. The process takes at least two hours, but a single application is effective in decreasing pain for some people for up to three months. If effective, the application process can be repeated every three months.  Anticonvulsants  Certain anti-seizure medications can lessen the pain associated with postherpetic neuralgia. These medications stabilize abnormal electrical activity in your nervous system caused by injured nerves. Doctors may prescribe gabapentin (Neurontin, Gralise), pregabalin (Lyrica) or another anticonvulsant to help control burning and pain. Side effects of these drugs include drowsiness, unclear thinking, unsteadiness and swelling in the feet.  Antidepressants  Certain antidepressants -- such as nortriptyline (Pamelor), amitriptyline, duloxetine (Cymbalta) and venlafaxine (Effexor XR) -- affect key brain chemicals that play a role in both depression and how your body interprets pain. Doctors often prescribe antidepressants for postherpetic neuralgia in smaller doses than they do for depression alone. Common side effects of these medications include drowsiness, dry mouth, lightheadedness and weight gain. Side effects may vary depending on the antidepressant.  Opioid painkillers  Some people may need prescription-strength pain medications containing tramadol (Ultram, Ryzolt, Conzip), oxycodone (Percocet, Roxicet, Tylox) or morphine. Opioids can cause mild dizziness, drowsiness, confusion and constipation. They can also be addictive. Although this risk is generally low, discuss it with your doctor. Tramadol has been linked to psychological reactions, such as emotional  disturbances and suicidal thoughts. These medications should not be combined with alcohol or other drugs and may impair your ability to drive.

## 2013-05-10 NOTE — ED Notes (Signed)
Pt was given oxycodone today for pain control r/t shingles.  Pt states pain is unbearable even with medication.  Rash noted to L abdomen.

## 2013-05-11 NOTE — ED Provider Notes (Signed)
  This was a shared visit with a mid-level provided (NP or PA).  Throughout the patient's course I was available for consultation/collaboration.    On my exam the patient was in no distress.  We had a lengthy conversation about both personal and alternatives pain therapy for her postherpetic neuralgia.     Carmin Muskrat, MD 05/11/13 [phone]

## 2013-05-15 ENCOUNTER — Encounter (HOSPITAL_COMMUNITY): Payer: Self-pay | Admitting: Emergency Medicine

## 2013-05-15 ENCOUNTER — Emergency Department (HOSPITAL_COMMUNITY)
Admission: EM | Admit: 2013-05-15 | Discharge: 2013-05-15 | Disposition: A | Discharge status: Home / Self Care | Payer: Medicare Other | Attending: Emergency Medicine | Admitting: Emergency Medicine

## 2013-05-15 DIAGNOSIS — G40909 Epilepsy, unspecified, not intractable, without status epilepticus: Secondary | ICD-10-CM | POA: Insufficient documentation

## 2013-05-15 DIAGNOSIS — Z85038 Personal history of other malignant neoplasm of large intestine: Secondary | ICD-10-CM | POA: Insufficient documentation

## 2013-05-15 DIAGNOSIS — B0229 Other postherpetic nervous system involvement: Secondary | ICD-10-CM

## 2013-05-15 DIAGNOSIS — I10 Essential (primary) hypertension: Secondary | ICD-10-CM | POA: Insufficient documentation

## 2013-05-15 DIAGNOSIS — Z8619 Personal history of other infectious and parasitic diseases: Secondary | ICD-10-CM | POA: Insufficient documentation

## 2013-05-15 DIAGNOSIS — M129 Arthropathy, unspecified: Secondary | ICD-10-CM | POA: Insufficient documentation

## 2013-05-15 DIAGNOSIS — D649 Anemia, unspecified: Secondary | ICD-10-CM | POA: Insufficient documentation

## 2013-05-15 DIAGNOSIS — IMO0002 Reserved for concepts with insufficient information to code with codable children: Secondary | ICD-10-CM | POA: Insufficient documentation

## 2013-05-15 DIAGNOSIS — Z86718 Personal history of other venous thrombosis and embolism: Secondary | ICD-10-CM | POA: Insufficient documentation

## 2013-05-15 DIAGNOSIS — Z79899 Other long term (current) drug therapy: Secondary | ICD-10-CM | POA: Insufficient documentation

## 2013-05-15 DIAGNOSIS — R21 Rash and other nonspecific skin eruption: Secondary | ICD-10-CM | POA: Insufficient documentation

## 2013-05-15 DIAGNOSIS — Z7982 Long term (current) use of aspirin: Secondary | ICD-10-CM | POA: Insufficient documentation

## 2013-05-15 DIAGNOSIS — J45909 Unspecified asthma, uncomplicated: Secondary | ICD-10-CM | POA: Insufficient documentation

## 2013-05-15 MED ORDER — LIDOCAINE 5 % EX PTCH
1.0000 | MEDICATED_PATCH | CUTANEOUS | Status: DC
  Filled 2013-05-15: qty 1

## 2013-05-15 MED ORDER — LIDOCAINE 5 % EX OINT: 1.0000 "application " | TOPICAL_OINTMENT | Freq: Two times a day (BID) | CUTANEOUS | Status: DC | PRN

## 2013-05-15 MED ORDER — FENTANYL CITRATE 0.05 MG/ML IJ SOLN: 50.0000 ug | Freq: Once | INTRAMUSCULAR | Status: DC

## 2013-05-15 MED ORDER — FENTANYL CITRATE 0.05 MG/ML IJ SOLN
25.0000 ug | Freq: Once | INTRAMUSCULAR | Status: AC
  Administered 2013-05-15: 25 ug via INTRAVENOUS
  Filled 2013-05-15: qty 2

## 2013-05-15 MED ORDER — OXYCODONE-ACETAMINOPHEN 5-325 MG PO TABS
1.0000 | ORAL_TABLET | Freq: Once | ORAL | Status: AC
  Administered 2013-05-15: 1 via ORAL
  Filled 2013-05-15: qty 1

## 2013-05-15 MED ORDER — LIDOCAINE 5 % EX PTCH
2.0000 | MEDICATED_PATCH | CUTANEOUS | Status: DC
  Administered 2013-05-15: 2 via TRANSDERMAL
  Filled 2013-05-15: qty 2

## 2013-05-15 MED ORDER — DEXAMETHASONE SODIUM PHOSPHATE 10 MG/ML IJ SOLN: 10.0000 mg | Freq: Once | INTRAMUSCULAR | Status: DC

## 2013-05-15 MED ORDER — METHYLPREDNISOLONE SODIUM SUCC 125 MG IJ SOLR
125.0000 mg | Freq: Once | INTRAMUSCULAR | Status: AC
  Administered 2013-05-15: 125 mg via INTRAVENOUS
  Filled 2013-05-15: qty 2

## 2013-05-15 MED ORDER — CAPSAICIN 0.075 % EX CREA
TOPICAL_CREAM | Freq: Two times a day (BID) | CUTANEOUS | Status: DC
  Administered 2013-05-15: 1 via TOPICAL
  Filled 2013-05-15: qty 60

## 2013-05-15 NOTE — ED Notes (Signed)
Pt has ongoing nerve pain related to shingles located on left side, breast and back.  Rash has healed appropriately.

## 2013-05-15 NOTE — ED Notes (Signed)
Care transferred, report given Levada Dy, T.

## 2013-05-15 NOTE — Discharge Instructions (Signed)
Continue pain medications. Take oxycodone every 4 hrs. Double neurontin dose once a day. Use Lidocaine ointment as prescribed. Follow up with pain management.    Postherpetic Neuralgia Shingles is a painful disease. It is caused by the herpes zoster virus. This is the same virus which also causes chickenpox. It can affect the torso, limbs, or the face. For most people, shingles is a condition of rather sudden onset. Pain usually lasts about 1 month. In older patients, or patients with poor immune systems, a painful, long-standing (chronic) condition called postherpetic neuralgia can develop. This condition rarely happens before age 31. But at least 50% of people over 50 become affected following an attack of shingles. There is a natural tendency for this condition to improve over time with no treatment. Less than 5% of patients have pain that lasts for more than 1 year. DIAGNOSIS  Herpes is usually easily diagnosed on physical exam. Pain sometimes follows when the skin sores (lesions) have disappeared. It is called postherpetic neuralgia. That name simply means the pain that follows herpes. TREATMENT   Treating this condition may be difficult. Usually one of the tricyclic antidepressants, often amitriptyline, is the first line of treatment. There is evidence that the sooner these medications are given, the more likely they are to reduce pain.  Conventional analgesics, regional nerve blocks, and anticonvulsants have little benefit in most cases when used alone. Other tricyclic anti-depressants are used as a second option if the first antidepressant is unsuccessful.  Anticonvulsants, including carbamazepine, have been found to provide some added benefit when used with a tricyclic anti-depressant. This is especially for the stabbing type of pain similar to that of trigeminal neuralgia.  Chronic opioid therapy. This is a strong narcotic pain medication. It is used to treat pain that is resistant to other  measures. The issues of dependency and tolerance can be reduced with closely managed care.  Some cream treatments are applied locally to the affected area. They can help when used with other treatments. Their use may be difficult in the case of postherpetic trigeminal neuralgia. This is involved with the face. So the substances can irritate the eye and the skin around the eye. Examples of creams used include Capsaicin and lidocaine creams.  For shingles, antiviral therapies along with analgesics are recommended. Studies of the effect of anti-viral agents such as acyclovir on shingles have been done. They show improved rates of healing and decreased severity of sudden (acute) pain. Some observations suggest that nerve blocks during shingles infection will:  Reduce pain.  Shorten the acute episode.  Prevent the emergence of postherpetic neuralgia. Viral medications used include Acyclovir (Zovirax), Valacyclovir, Famciclovir and a lysine diet. Document Released: 06/12/2002 Document Revised: 06/14/2011 Document Reviewed: 03/22/2005 [facility] Patient Information 2014 [place], [place].

## 2013-05-15 NOTE — ED Notes (Signed)
Pt arrives [facility] from home for Abdominal/Flank/Breast/Lower back pain since October. Pain is worse this AM. Denies N/V/D.

## 2013-05-15 NOTE — ED Provider Notes (Signed)
CSN: [id number]     Arrival date & time 05/15/13  1256 History   First MD Initiated Contact with Patient 05/15/13 1257     Chief Complaint  Patient presents with  . Abdominal Pain     (Consider location/radiation/quality/duration/timing/severity/associated sxs/prior Treatment) HPI [name] is a 68 y.o. female who presents emergency department with complaint of left flank pain. Patient had a recent diagnosis of shingles in December 2014. She had a painful rash to the right flank which radiated from the left lower anterior ribs around to the mid back. Patient has been seen in emergency department multiple times for the same. She has tried multiple different medications for this pain including Neurontin, oxycodone, fentanyl patch which she's still on, lidocaine cream. Patient states nothing is helping the pain. She states rash has healed but she continues to have pain that is sensitive to the touch and movement. Patient states pain is 10 and 10. It is sharp and burning. It radiates all around torso but no other locations of radiation. Denies any fever, chills. Denies any chest pain, shortness of breath, cough, abdominal pain, nausea or vomiting. She states she has seen her doctor for this as well and reports that he did not do anything different for her.  Past Medical History  Diagnosis Date  . 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 Hypertension   . Colon cancer   . Asthma   . DVT (deep venous thrombosis)     right jugular  . Anemia   . Arthritis   . Clotting disorder   . Seizures   . PONV (postoperative nausea and vomiting)   . Spinal headache   . Shingles rash    Past Surgical History  Procedure Laterality Date  . Abdominal hysterectomy  2012  . Small intestine surgery    . Colonoscopy    . Portacath placement    . Colon surgery    . Radiofrequency ablation liver tumor     Family History  Problem Relation Age of Onset  . Hypertension Father   .  Cancer Brother     colon  . Cancer Daughter     colon  . Breast cancer Sister    History  Substance Use Topics  . Smoking status: Never Smoker   . Smokeless tobacco: Never Used  . Alcohol Use: No   OB History   Grav Para Term Preterm Abortions TAB SAB Ect Mult Living                 Review of Systems  Constitutional: Negative for fever and chills.  Respiratory: Negative for cough, chest tightness and shortness of breath.   Cardiovascular: Negative for chest pain, palpitations and leg swelling.  Gastrointestinal: Negative for nausea, vomiting, abdominal pain and diarrhea.  Genitourinary: Negative for dysuria and flank pain.  Musculoskeletal: Negative for arthralgias, myalgias, neck pain and neck stiffness.  Skin: Positive for rash.       Painful rash  Neurological: Negative for dizziness, weakness and headaches.  All other systems reviewed and are negative.      Allergies  Review of patient's allergies indicates no known allergies.  Home Medications   Current Outpatient Rx  Name  Route  Sig  Dispense  Refill  . albuterol (PROVENTIL HFA;VENTOLIN HFA) 108 (90 BASE) MCG/ACT inhaler   Inhalation   Inhale 2 puffs into the lungs every 6 (six) hours as needed for wheezing or shortness of breath.          Marland Kitchen  aspirin EC 81 MG tablet   Oral   Take 81 mg by mouth daily.         . Azilsartan-Chlorthalidone (EDARBYCLOR) 40-25 MG TABS   Oral   Take 1 tablet by mouth daily.         . cloNIDine (CATAPRES) 0.1 MG tablet   Oral   Take 0.1 mg by mouth 2 (two) times daily.           Marland Kitchen diltiazem (CARDIZEM CD) 180 MG 24 hr capsule   Oral   Take 180 mg by mouth every morning.          . fentaNYL (DURAGESIC) 25 MCG/HR patch   Transdermal   Place 1 patch (25 mcg total) onto the skin every 3 (three) days.   5 patch   0   . ferrous sulfate 325 (65 FE) MG tablet   Oral   Take 325 mg by mouth 3 (three) times daily with meals.         . Fluticasone-Salmeterol  (ADVAIR) 100-50 MCG/DOSE AEPB   Inhalation   Inhale 2 puffs into the lungs 2 (two) times daily as needed. WHEEZING          . gabapentin (NEURONTIN) 300 MG capsule   Oral   Take 300-600 mg by mouth at bedtime. *instructed to take 2 if needed*         . labetalol (NORMODYNE) 100 MG tablet   Oral   Take 200 mg by mouth 2 (two) times daily.          Marland Kitchen levETIRAcetam (KEPPRA) 250 MG tablet   Oral   Take 250 mg by mouth every 12 (twelve) hours.          . lidocaine (XYLOCAINE) 2 % jelly   Topical   Apply 1 application topically as needed. Apply to the area of rash as needed for pain but no more than 3 times per day   30 mL   0   . Multiple Vitamin (MULTIVITAMIN) tablet   Oral   Take 1 tablet by mouth daily.           . nebivolol (BYSTOLIC) 10 MG tablet   Oral   Take 10 mg by mouth at bedtime. Take if  SBP > 145  .  Take at  Bedtime.         Marland Kitchen oxyCODONE-acetaminophen (PERCOCET/ROXICET) 5-325 MG per tablet   Oral   Take 2 tablets by mouth every 4 (four) hours as needed for severe pain.         . polyethylene glycol (MIRALAX / GLYCOLAX) packet   Oral   Take 17 g by mouth 3 (three) times daily as needed for mild constipation.         . potassium chloride SA (K-DUR,KLOR-CON) 20 MEQ tablet   Oral   Take 20 mEq by mouth 3 (three) times daily.           BP 166/76  Pulse 64  Temp(Src) 98.5 F (36.9 C) (Oral)  Resp 20  SpO2 100% Physical Exam  Nursing note and vitals reviewed. Constitutional: She appears well-developed and well-nourished. No distress.  HENT:  Head: Normocephalic.  Eyes: Conjunctivae are normal.  Neck: Neck supple.  Cardiovascular: Normal rate, regular rhythm and normal heart sounds.   Pulmonary/Chest: Effort normal and breath sounds normal. No respiratory distress. She has no wheezes. She has no rales.  Abdominal: Soft. Bowel sounds are normal. She exhibits no distension. There is no tenderness.  There is no rebound.  Musculoskeletal: She  exhibits no edema.  Neurological: She is alert.  Skin: Skin is warm and dry. Rash noted.  Healed rash with just scarring to the left flank, from the mid lower chest to the left flank. Tender to soft touch.   Psychiatric: She has a normal mood and affect. Her behavior is normal.    ED Course  Procedures (including critical care time) Labs Review Labs Reviewed - No data to display Imaging Review No results found.  EKG Interpretation   None       MDM   Final diagnoses:  Postherpetic neuralgia    Patient with healing herpes zoster to the left flank. There is no more rash. There some discoloration for rash was.. Still very sensitive to the touch. Patient is in pain crying. We'll try some pain medications, fentanyl IV ordered, we'll try some capsacin cream.   Pt did not tolerate capsacin cream. Lidocaine patches applied. Pain improved with fentanyl.    4:48 PM Pt feeling better. Discussed following up with pain management. Referral provided. Pt instructed to follow up outpatient.   Filed Vitals:   05/15/13 1330 05/15/13 1400 05/15/13 1430 05/15/13 1600  BP: 160/96 166/76 163/84 169/57  Pulse: 65 64 65 66  Temp:      TempSrc:      Resp:      SpO2: 98% 100% 97% 97%        Renold Genta, PA-C 05/15/13 1649

## 2013-05-18 ENCOUNTER — Encounter (HOSPITAL_COMMUNITY): Payer: Self-pay | Admitting: Emergency Medicine

## 2013-05-18 ENCOUNTER — Emergency Department (HOSPITAL_COMMUNITY)
Admission: EM | Admit: 2013-05-18 | Discharge: 2013-05-19 | Disposition: A | Discharge status: Home / Self Care | Payer: Medicare Other | Attending: Emergency Medicine | Admitting: Emergency Medicine

## 2013-05-18 DIAGNOSIS — D649 Anemia, unspecified: Secondary | ICD-10-CM | POA: Insufficient documentation

## 2013-05-18 DIAGNOSIS — Z9221 Personal history of antineoplastic chemotherapy: Secondary | ICD-10-CM | POA: Insufficient documentation

## 2013-05-18 DIAGNOSIS — G40909 Epilepsy, unspecified, not intractable, without status epilepticus: Secondary | ICD-10-CM | POA: Insufficient documentation

## 2013-05-18 DIAGNOSIS — Z85038 Personal history of other malignant neoplasm of large intestine: Secondary | ICD-10-CM | POA: Insufficient documentation

## 2013-05-18 DIAGNOSIS — Z7982 Long term (current) use of aspirin: Secondary | ICD-10-CM | POA: Insufficient documentation

## 2013-05-18 DIAGNOSIS — IMO0002 Reserved for concepts with insufficient information to code with codable children: Secondary | ICD-10-CM | POA: Insufficient documentation

## 2013-05-18 DIAGNOSIS — B0229 Other postherpetic nervous system involvement: Secondary | ICD-10-CM

## 2013-05-18 DIAGNOSIS — R112 Nausea with vomiting, unspecified: Secondary | ICD-10-CM | POA: Insufficient documentation

## 2013-05-18 DIAGNOSIS — J45909 Unspecified asthma, uncomplicated: Secondary | ICD-10-CM | POA: Insufficient documentation

## 2013-05-18 DIAGNOSIS — M129 Arthropathy, unspecified: Secondary | ICD-10-CM | POA: Insufficient documentation

## 2013-05-18 DIAGNOSIS — Z86718 Personal history of other venous thrombosis and embolism: Secondary | ICD-10-CM | POA: Insufficient documentation

## 2013-05-18 DIAGNOSIS — Z8505 Personal history of malignant neoplasm of liver: Secondary | ICD-10-CM | POA: Insufficient documentation

## 2013-05-18 DIAGNOSIS — Z79899 Other long term (current) drug therapy: Secondary | ICD-10-CM | POA: Insufficient documentation

## 2013-05-18 DIAGNOSIS — B0222 Postherpetic trigeminal neuralgia: Secondary | ICD-10-CM | POA: Insufficient documentation

## 2013-05-18 DIAGNOSIS — Z923 Personal history of irradiation: Secondary | ICD-10-CM | POA: Insufficient documentation

## 2013-05-18 DIAGNOSIS — I10 Essential (primary) hypertension: Secondary | ICD-10-CM | POA: Insufficient documentation

## 2013-05-18 MED ORDER — ONDANSETRON HCL 4 MG/2ML IJ SOLN
4.0000 mg | Freq: Once | INTRAMUSCULAR | Status: AC
  Administered 2013-05-18: 4 mg via INTRAVENOUS
  Filled 2013-05-18: qty 2

## 2013-05-18 MED ORDER — FENTANYL CITRATE 0.05 MG/ML IJ SOLN
50.0000 ug | Freq: Once | INTRAMUSCULAR | Status: AC
  Administered 2013-05-18: 50 ug via INTRAVENOUS
  Filled 2013-05-18: qty 2

## 2013-05-18 MED ORDER — FENTANYL CITRATE 0.05 MG/ML IJ SOLN
25.0000 ug | Freq: Once | INTRAMUSCULAR | Status: AC
  Administered 2013-05-18: 25 ug via INTRAVENOUS
  Filled 2013-05-18: qty 2

## 2013-05-18 MED ORDER — LIDOCAINE 5 % EX PTCH
1.0000 | MEDICATED_PATCH | Freq: Once | CUTANEOUS | Status: DC
  Administered 2013-05-18: 1 via TRANSDERMAL
  Filled 2013-05-18: qty 1

## 2013-05-18 MED ORDER — FENTANYL CITRATE 0.05 MG/ML IJ SOLN
25.0000 ug | Freq: Once | INTRAMUSCULAR | Status: AC
  Administered 2013-05-18: 25 ug via INTRAVENOUS

## 2013-05-18 NOTE — ED Notes (Signed)
Attempted to start PIV x 2 with no success.

## 2013-05-18 NOTE — ED Notes (Addendum)
Patient arrived via [facility] from [facility] with abdominal pain, N/V with dizziness, negative orthostatics. Patient was seen here on Tuesday for shingles, no rash or open wounds present at this time. Fentanyl 56mcq patch on her left anterior chest. BP 146/78, HR 63, resp 18, 98% Spo2, CBG 111.

## 2013-05-18 NOTE — ED Notes (Signed)
Pt's daughter states that pt has an appt with Dr. Vincente Liberty on 06/02/13 but pain was too severe to wait til PCP appt.

## 2013-05-18 NOTE — ED Provider Notes (Signed)
  This was a shared visit with a mid-level provided (NP or PA).  Throughout the patient's course I was available for consultation/collaboration.  On my exam the patient was in no distress.  She was in a similar situation to several days prior, when I last saw her for similar concerns.  Notably, the patient has seen multiple providers (of different specialties) due to her post-herpetic neuralgia. On this exam she had stable VS, and there was no new e/o systemic disease or acute changes. We discussed additional means of analgesia, both traditional and alternative medicine.  We attempted a new technique for analgesia, and she was stable for d/c.      Carmin Muskrat, MD 05/18/13 [phone]

## 2013-05-19 MED ORDER — ONDANSETRON HCL 4 MG PO TABS: 4.0000 mg | ORAL_TABLET | Freq: Four times a day (QID) | ORAL | Status: DC

## 2013-05-19 MED ORDER — FENTANYL 50 MCG/HR TD PT72: 50.0000 ug | MEDICATED_PATCH | TRANSDERMAL | Status: DC

## 2013-05-19 MED ORDER — LIDOCAINE 5 % EX PTCH: 1.0000 | MEDICATED_PATCH | CUTANEOUS | Status: DC

## 2013-05-19 NOTE — ED Provider Notes (Signed)
I saw and evaluated the patient, reviewed the resident's note and I agree with the findings and plan.  EKG Interpretation    Date/Time:  Friday May 18 2013 20:05:18 EST Ventricular Rate:  63 PR Interval:  150 QRS Duration: 85 QT Interval:  421 QTC Calculation: 431 R Axis:   43 Text Interpretation:  Sinus rhythm Atrial premature complex Nonspecific T abnormalities, lateral leads No significant change since last tracing Confirmed by Maryan Rued  MD, Quincey Nored ([id number]) on 05/18/2013 9:28:15 PM            Pt with persisent pain from postherpectic neuralgia.  No findings concerning for other underlying abd pain.  Denies fever and other than in the area of healed rash no other abd pain.  Pt already with fentanyl patches and gabapentin.  Recommended pain management which pt is currently trying to arrange.  I have reviewed EKG and agree with the resident interpretation.  you   Blanchie Dessert, MD 05/19/13 [phone]

## 2013-05-19 NOTE — Discharge Instructions (Signed)
Emergency Department Resource Guide 1) Find a Doctor and Pay Out of Pocket Although you won't have to find out who is covered by your insurance plan, it is a good idea to ask around and get recommendations. You will then need to call the office and see if the doctor you have chosen will accept you as a new patient and what types of options they offer for patients who are self-pay. Some doctors offer discounts or will set up payment plans for their patients who do not have insurance, but you will need to ask so you aren't surprised when you get to your appointment.  2) Contact Your Local Health Department Not all health departments have doctors that can see patients for sick visits, but many do, so it is worth a call to see if yours does. If you don't know where your local health department is, you can check in your phone book. The CDC also has a tool to help you locate your state's health department, and many state websites also have listings of all of their local health departments.  3) Find a [facility] If your illness is not likely to be very severe or complicated, you may want to try a walk in clinic. These are popping up all over the country in pharmacies, drugstores, and shopping centers. They're usually staffed by nurse practitioners or physician assistants that have been trained to treat common illnesses and complaints. They're usually fairly quick and inexpensive. However, if you have serious medical issues or chronic medical problems, these are probably not your best option.  No Primary Care Doctor: - Call [facility] at  [phone] - they can help you locate a primary care doctor that  accepts your insurance, provides certain services, etc. - Physician Referral Service- [phone]  Chronic Pain Problems: Organization         Address  Phone   Notes  [facility]  [phone] Patients need to be referred by their primary care doctor.   Postherpetic  Neuralgia Shingles is a painful disease. It is caused by the herpes zoster virus. This is the same virus which also causes chickenpox. It can affect the torso, limbs, or the face. For most people, shingles is a condition of rather sudden onset. Pain usually lasts about 1 month. In older patients, or patients with poor immune systems, a painful, long-standing (chronic) condition called postherpetic neuralgia can develop. This condition rarely happens before age 37. But at least 50% of people over 50 become affected following an attack of shingles. There is a natural tendency for this condition to improve over time with no treatment. Less than 5% of patients have pain that lasts for more than 1 year. DIAGNOSIS  Herpes is usually easily diagnosed on physical exam. Pain sometimes follows when the skin sores (lesions) have disappeared. It is called postherpetic neuralgia. That name simply means the pain that follows herpes. TREATMENT   Treating this condition may be difficult. Usually one of the tricyclic antidepressants, often amitriptyline, is the first line of treatment. There is evidence that the sooner these medications are given, the more likely they are to reduce pain.  Conventional analgesics, regional nerve blocks, and anticonvulsants have little benefit in most cases when used alone. Other tricyclic anti-depressants are used as a second option if the first antidepressant is unsuccessful.  Anticonvulsants, including carbamazepine, have been found to provide some added benefit when used with a tricyclic anti-depressant. This is especially for the stabbing type of  pain similar to that of trigeminal neuralgia.  Chronic opioid therapy. This is a strong narcotic pain medication. It is used to treat pain that is resistant to other measures. The issues of dependency and tolerance can be reduced with closely managed care.  Some cream treatments are applied locally to the affected area. They can help when  used with other treatments. Their use may be difficult in the case of postherpetic trigeminal neuralgia. This is involved with the face. So the substances can irritate the eye and the skin around the eye. Examples of creams used include Capsaicin and lidocaine creams.  For shingles, antiviral therapies along with analgesics are recommended. Studies of the effect of anti-viral agents such as acyclovir on shingles have been done. They show improved rates of healing and decreased severity of sudden (acute) pain. Some observations suggest that nerve blocks during shingles infection will:  Reduce pain.  Shorten the acute episode.  Prevent the emergence of postherpetic neuralgia. Viral medications used include Acyclovir (Zovirax), Valacyclovir, Famciclovir and a lysine diet. Document Released: 06/12/2002 Document Revised: 06/14/2011 Document Reviewed: 03/22/2005 [facility] Patient Information 2014 [place], [place].

## 2013-05-19 NOTE — ED Provider Notes (Signed)
CSN: [id number]     Arrival date & time 05/18/13  1704 History   First MD Initiated Contact with Patient 05/18/13 2011     Chief Complaint  Patient presents with  . Abdominal Pain   HPI Comments: 68 yo F hx of recent shingles, post-herpetic neuralgia for which she's been seen in [facility] ED 5x this year, metastatic colon CA in remission since 2011 s/p chemo, radiation, surgery, seizures, DVT, presents with CC left flank, abdominal pain.  Pt states pain started several months prior following onset of shingles.  She did have rash at this site of pain, which has healed since then.  She describes excrutiating pain of left flank and abdomen, similar to previous post-herpetic neuralgia episodes, burning/stabbing pain, exacerbated by even light touch to skin.  Pt had severe pain today, and following this had one episode of vomiting.  She denies fever, CP, SOB, constipation, diarrhea, dysuria, vaginal symptoms, or any other symptoms.  Pt has been taking prescribed oxycodone, fenanyl patch 25 mcg, and gabapentin, without much benefit.  She was last seen in this ED on 2/10, and received fentanyl boluses IV, lidoderm patch, with improvement in her symptoms.  Pt encouraged to seek f/u at pain management clinic, however pt has had difficulty getting an appointment.  The history is provided by the patient. No language interpreter was used.    Past Medical History  Diagnosis Date  . 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 Hypertension   . Colon cancer   . Asthma   . DVT (deep venous thrombosis)     right jugular  . Anemia   . Arthritis   . Clotting disorder   . Seizures   . PONV (postoperative nausea and vomiting)   . Spinal headache   . Shingles rash    Past Surgical History  Procedure Laterality Date  . Abdominal hysterectomy  2012  . Small intestine surgery    . Colonoscopy    . Portacath placement    . Colon surgery    . Radiofrequency ablation liver tumor     Family  History  Problem Relation Age of Onset  . Hypertension Father   . Cancer Brother     colon  . Cancer Daughter     colon  . Breast cancer Sister    History  Substance Use Topics  . Smoking status: Never Smoker   . Smokeless tobacco: Never Used  . Alcohol Use: No   OB History   Grav Para Term Preterm Abortions TAB SAB Ect Mult Living                 Review of Systems  Constitutional: Negative for fever and chills.  Respiratory: Negative for cough and shortness of breath.   Cardiovascular: Negative for chest pain, palpitations and leg swelling.  Gastrointestinal: Positive for nausea, vomiting and abdominal pain. Negative for diarrhea, constipation and abdominal distention.  Musculoskeletal: Negative for myalgias.  Skin: Positive for rash.  Neurological: Negative for dizziness, weakness, light-headedness, numbness and headaches.  Hematological: Negative for adenopathy. Does not bruise/bleed easily.  All other systems reviewed and are negative.      Allergies  Review of patient's allergies indicates no known allergies.  Home Medications   Current Outpatient Rx  Name  Route  Sig  Dispense  Refill  . albuterol (PROVENTIL HFA;VENTOLIN HFA) 108 (90 BASE) MCG/ACT inhaler   Inhalation   Inhale 2 puffs into the lungs every  6 (six) hours as needed for wheezing or shortness of breath.          Marland Kitchen aspirin EC 81 MG tablet   Oral   Take 81 mg by mouth daily.         . Azilsartan-Chlorthalidone (EDARBYCLOR) 40-25 MG TABS   Oral   Take 1 tablet by mouth daily.         . cloNIDine (CATAPRES) 0.1 MG tablet   Oral   Take 0.1 mg by mouth daily.          Marland Kitchen diltiazem (CARDIZEM CD) 180 MG 24 hr capsule   Oral   Take 180 mg by mouth every morning.          . fentaNYL (DURAGESIC) 25 MCG/HR patch   Transdermal   Place 1 patch (25 mcg total) onto the skin every 3 (three) days.   5 patch   0   . ferrous sulfate 325 (65 FE) MG tablet   Oral   Take 325 mg by mouth 3  (three) times daily with meals.         . Fluticasone-Salmeterol (ADVAIR) 100-50 MCG/DOSE AEPB   Inhalation   Inhale 2 puffs into the lungs 2 (two) times daily as needed. WHEEZING          . gabapentin (NEURONTIN) 300 MG capsule   Oral   Take 300-600 mg by mouth at bedtime. *instructed to take 2 if needed*         . labetalol (NORMODYNE) 100 MG tablet   Oral   Take 200 mg by mouth 2 (two) times daily.          Marland Kitchen levETIRAcetam (KEPPRA) 250 MG tablet   Oral   Take 250 mg by mouth every 12 (twelve) hours.          . lidocaine (XYLOCAINE) 2 % jelly   Topical   Apply 1 application topically as needed. Apply to the area of rash as needed for pain but no more than 3 times per day   30 mL   0   . lidocaine (XYLOCAINE) 5 % ointment   Topical   Apply 1 application topically 2 (two) times daily as needed for mild pain.         . Multiple Vitamin (MULTIVITAMIN) tablet   Oral   Take 1 tablet by mouth daily.           . nebivolol (BYSTOLIC) 10 MG tablet   Oral   Take 10 mg by mouth at bedtime. Take if  SBP > 145  .  Take at  Bedtime.         Marland Kitchen oxyCODONE-acetaminophen (PERCOCET/ROXICET) 5-325 MG per tablet   Oral   Take 1 tablet by mouth every 4 (four) hours as needed for severe pain.          . polyethylene glycol (MIRALAX / GLYCOLAX) packet   Oral   Take 17 g by mouth 3 (three) times daily as needed for mild constipation.         . potassium chloride SA (K-DUR,KLOR-CON) 20 MEQ tablet   Oral   Take 20 mEq by mouth 3 (three) times daily.           BP 158/74  Pulse 75  Temp(Src) 97 F (36.1 C) (Oral)  Resp 19  SpO2 99% Physical Exam  Nursing note and vitals reviewed. Constitutional: She is oriented to person, place, and time. She appears well-developed and well-nourished.  HENT:  Head: Normocephalic and atraumatic.  Right Ear: External ear normal.  Left Ear: External ear normal.  Eyes: Conjunctivae and EOM are normal. Pupils are equal, round, and  reactive to light.  Neck: Normal range of motion. Neck supple. No JVD present. No tracheal deviation present. No thyromegaly present.  Cardiovascular: Normal rate, regular rhythm, normal heart sounds and intact distal pulses.   Pulmonary/Chest: Effort normal and breath sounds normal. No respiratory distress. She has no wheezes. She has no rales. She exhibits no tenderness.  Abdominal: Soft. Bowel sounds are normal. She exhibits no distension and no mass. There is no tenderness. There is no rebound and no guarding.  No TTP to deep palpation.  Abdomen soft, nondistended.  Musculoskeletal: Normal range of motion. She exhibits no edema and no tenderness.  Lymphadenopathy:    She has no cervical adenopathy.  Neurological: She is alert and oriented to person, place, and time.  Skin: Skin is warm and dry.  Healed zoster rash of left flank and abdomen.  Hypersensitivity of this skin, with intense pain even with light touch.  No signs of active infection, cellulitis, or abscess.    ED Course  Procedures (including critical care time) Labs Review Labs Reviewed - No data to display Imaging Review No results found.  EKG Interpretation    Date/Time:  Friday May 18 2013 20:05:18 EST Ventricular Rate:  63 PR Interval:  150 QRS Duration: 85 QT Interval:  421 QTC Calculation: 431 R Axis:   43 Text Interpretation:  Sinus rhythm Atrial premature complex Nonspecific T abnormalities, lateral leads No significant change since last tracing Confirmed by Maryan Rued  MD, WHITNEY ([id number]) on 05/18/2013 9:28:15 PM            MDM   Final diagnoses:  None   68 yo F hx of recent shingles, post-herpetic neuralgia for which she's been seen in [facility] ED 5x this year, metastatic colon CA in remission since 2011 s/p chemo, radiation, surgery, seizures, DVT, presents with CC left flank, abdominal pain.    Filed Vitals:   05/19/13 0045  BP: 161/75  Pulse: 62  Temp:   Resp: 15   Physical exam as  above.  Pt with healing zoster rash, with hypersensivity of skin, without TTP to deep palpation of abdomen.  Given no true abdominal pain, and benign abdominal exam, there is no need for labs or imaging at this time. Findings are consistent with previous exams, and reflect ongoing post-herpetic neuralgia.    Pt given fentanyl IV total of 100 mcg, Lidoderm patch, with some improvement in pain, and pt states pain is still there but manageable.    Plan is to increase fentanyl patch at home to the 50 mcg patch, Rx Lidoderm patch, and Zofran for nausea.  Will give pt resources of f/u with pain clinic, and encourage pt to f/u with PCP in next few days for continued management of ongoing pain.  Pt understands and agrees with plan.  Discussed pt's care plan with Dr. Maryan Rued.  Sinda Du, MD     Sinda Du, MD 05/19/13 [phone]

## 2013-05-29 ENCOUNTER — Encounter (HOSPITAL_COMMUNITY): Payer: Self-pay | Admitting: Emergency Medicine

## 2013-05-29 ENCOUNTER — Emergency Department (HOSPITAL_COMMUNITY)
Admission: EM | Admit: 2013-05-29 | Discharge: 2013-05-29 | Disposition: A | Discharge status: Home / Self Care | Payer: Medicare Other | Attending: Emergency Medicine | Admitting: Emergency Medicine

## 2013-05-29 DIAGNOSIS — L309 Dermatitis, unspecified: Secondary | ICD-10-CM

## 2013-05-29 DIAGNOSIS — M129 Arthropathy, unspecified: Secondary | ICD-10-CM | POA: Insufficient documentation

## 2013-05-29 DIAGNOSIS — Z7982 Long term (current) use of aspirin: Secondary | ICD-10-CM | POA: Insufficient documentation

## 2013-05-29 DIAGNOSIS — L259 Unspecified contact dermatitis, unspecified cause: Secondary | ICD-10-CM | POA: Insufficient documentation

## 2013-05-29 DIAGNOSIS — G40909 Epilepsy, unspecified, not intractable, without status epilepticus: Secondary | ICD-10-CM | POA: Insufficient documentation

## 2013-05-29 DIAGNOSIS — Z86718 Personal history of other venous thrombosis and embolism: Secondary | ICD-10-CM | POA: Insufficient documentation

## 2013-05-29 DIAGNOSIS — B0229 Other postherpetic nervous system involvement: Secondary | ICD-10-CM | POA: Insufficient documentation

## 2013-05-29 DIAGNOSIS — J45909 Unspecified asthma, uncomplicated: Secondary | ICD-10-CM | POA: Insufficient documentation

## 2013-05-29 DIAGNOSIS — Z85038 Personal history of other malignant neoplasm of large intestine: Secondary | ICD-10-CM | POA: Insufficient documentation

## 2013-05-29 DIAGNOSIS — Z8505 Personal history of malignant neoplasm of liver: Secondary | ICD-10-CM | POA: Insufficient documentation

## 2013-05-29 DIAGNOSIS — M549 Dorsalgia, unspecified: Secondary | ICD-10-CM | POA: Insufficient documentation

## 2013-05-29 DIAGNOSIS — I10 Essential (primary) hypertension: Secondary | ICD-10-CM | POA: Insufficient documentation

## 2013-05-29 DIAGNOSIS — Z79899 Other long term (current) drug therapy: Secondary | ICD-10-CM | POA: Insufficient documentation

## 2013-05-29 DIAGNOSIS — D649 Anemia, unspecified: Secondary | ICD-10-CM | POA: Insufficient documentation

## 2013-05-29 MED ORDER — FENTANYL CITRATE 0.05 MG/ML IJ SOLN
25.0000 ug | Freq: Once | INTRAMUSCULAR | Status: AC
  Administered 2013-05-29: 25 ug via INTRAVENOUS
  Filled 2013-05-29: qty 2

## 2013-05-29 MED ORDER — HYDROCORTISONE 1 % EX CREA: TOPICAL_CREAM | CUTANEOUS | Status: DC

## 2013-05-29 MED ORDER — SODIUM CHLORIDE 0.9 % IV BOLUS (SEPSIS)
1000.0000 mL | Freq: Once | INTRAVENOUS | Status: AC
  Administered 2013-05-29: 1000 mL via INTRAVENOUS

## 2013-05-29 NOTE — ED Notes (Signed)
Pt has area of scars below L breast around to back, not crossing midline. Pt states she was dx with shingles a month ago

## 2013-05-29 NOTE — ED Notes (Signed)
PA at bedside.

## 2013-05-29 NOTE — Discharge Instructions (Signed)
Please call your doctor for a followup appointment within 24-48 hours. When you talk to your doctor please let them know that you were seen in the emergency department and have them acquire all of your records so that they can discuss the findings with you and formulate a treatment plan to fully care for your new and ongoing problems. Please keep appointment with Dr. Katherine Roan on Thursday, 05/31/2013 Please rest and stay hydrated Please continue to take pain medications at home as prescribed If pain continues please speak with primary care provider regarding pain management specialist Please avoid any physical or strenuous activity Please continue to monitor symptoms closely and if symptoms are to worsen or change (fever greater than 101, chills, sweating, nausea, vomiting, diarrhea, abdominal pain, chest pain, shortness of breath, difficulty breathing, decreased urine, changes to bowel movements) please report back to the ED immediately  Postherpetic Neuralgia Shingles is a painful disease. It is caused by the herpes zoster virus. This is the same virus which also causes chickenpox. It can affect the torso, limbs, or the face. For most people, shingles is a condition of rather sudden onset. Pain usually lasts about 1 month. In older patients, or patients with poor immune systems, a painful, long-standing (chronic) condition called postherpetic neuralgia can develop. This condition rarely happens before age 100. But at least 50% of people over 50 become affected following an attack of shingles. There is a natural tendency for this condition to improve over time with no treatment. Less than 5% of patients have pain that lasts for more than 1 year. DIAGNOSIS  Herpes is usually easily diagnosed on physical exam. Pain sometimes follows when the skin sores (lesions) have disappeared. It is called postherpetic neuralgia. That name simply means the pain that follows herpes. TREATMENT   Treating this condition  may be difficult. Usually one of the tricyclic antidepressants, often amitriptyline, is the first line of treatment. There is evidence that the sooner these medications are given, the more likely they are to reduce pain.  Conventional analgesics, regional nerve blocks, and anticonvulsants have little benefit in most cases when used alone. Other tricyclic anti-depressants are used as a second option if the first antidepressant is unsuccessful.  Anticonvulsants, including carbamazepine, have been found to provide some added benefit when used with a tricyclic anti-depressant. This is especially for the stabbing type of pain similar to that of trigeminal neuralgia.  Chronic opioid therapy. This is a strong narcotic pain medication. It is used to treat pain that is resistant to other measures. The issues of dependency and tolerance can be reduced with closely managed care.  Some cream treatments are applied locally to the affected area. They can help when used with other treatments. Their use may be difficult in the case of postherpetic trigeminal neuralgia. This is involved with the face. So the substances can irritate the eye and the skin around the eye. Examples of creams used include Capsaicin and lidocaine creams.  For shingles, antiviral therapies along with analgesics are recommended. Studies of the effect of anti-viral agents such as acyclovir on shingles have been done. They show improved rates of healing and decreased severity of sudden (acute) pain. Some observations suggest that nerve blocks during shingles infection will:  Reduce pain.  Shorten the acute episode.  Prevent the emergence of postherpetic neuralgia. Viral medications used include Acyclovir (Zovirax), Valacyclovir, Famciclovir and a lysine diet. Document Released: 06/12/2002 Document Revised: 06/14/2011 Document Reviewed: 03/22/2005 [facility] Patient Information 2014 [place], [place].   Emergency Department  Resource Guide 1)  Find a Librarian, academic and Pay Out of Pocket Although you won't have to find out who is covered by your insurance plan, it is a good idea to ask around and get recommendations. You will then need to call the office and see if the doctor you have chosen will accept you as a new patient and what types of options they offer for patients who are self-pay. Some doctors offer discounts or will set up payment plans for their patients who do not have insurance, but you will need to ask so you aren't surprised when you get to your appointment.  2) Contact Your Local Health Department Not all health departments have doctors that can see patients for sick visits, but many do, so it is worth a call to see if yours does. If you don't know where your local health department is, you can check in your phone book. The CDC also has a tool to help you locate your state's health department, and many state websites also have listings of all of their local health departments.  3) Find a Walk-in Clinic If your illness is not likely to be very severe or complicated, you may want to try a walk in clinic. These are popping up all over the country in pharmacies, drugstores, and shopping centers. They're usually staffed by nurse practitioners or physician assistants that have been trained to treat common illnesses and complaints. They're usually fairly quick and inexpensive. However, if you have serious medical issues or chronic medical problems, these are probably not your best option.  No Primary Care Doctor: - Call Health Connect at  [phone] - they can help you locate a primary care doctor that  accepts your insurance, provides certain services, etc. - Physician Referral Service- [phone]  Chronic Pain Problems: Organization         Address  Phone   Notes  [facility]  [phone] Patients need to be referred by their primary care doctor.   Medication Assistance: Organization         Address  Phone    Notes  [facility] [address] [place] [place], [place] [address] [phone] --Must be a resident of [facility] -- Must have NO insurance coverage whatsoever (no Medicaid/ Medicare, etc.) -- The pt. MUST have a primary care doctor that directs their care regularly and follows them in the community   [facility]ist  [phone]   [employer]  [phone]    Agencies that provide inexpensive medical care: Organization         Address  Phone   Notes  [facility]  [phone]   [facility]    [phone]   [facility] [address] [place], [place] [address] [phone]   [facility] [address] [place], [place] [phone]   Planned Parenthood    [phone]   [facility]    [phone]   [facility]  [address], [place] Phone:  [phone], Fax:  [phone] Hours of Operation:  9 am - 6 pm, M-F.  Also accepts Medicaid/Medicare and self-pay.  [facility]  [address], [place] Phone: [phone], Fax: [phone]. Hours of Operation:  8:30 am - 5:30 pm, M-F.  Also accepts Medicaid and self-pay.  [facility] [address]  [place], [place] Phone: [phone]   [place], [place], [place] [phone], Ext. [phone] Mondays & Thursdays: 7-9 AM.  First 15 patients are seen on a first come, first serve basis.    [place] Providers:  Organization         Address  Phone   Notes  [facility] [address], [place] [phone] Also accepts self-pay patients.  [facility] [id number] [place], [place]  [phone]   [place], [address], [place] [phone]   [facility] [address] [place] [phone]   Lucianne Lei [address], [place]   [phone] Only accepts [place] Access [place] patients after they have their name applied to their card.   Self-Pay (no insurance) in [facility]:  Organization         Address  Phone   Notes  Sickle Cell Patients, [facility] [place] [phone]   [facility] [place] [phone]   [facility] [place]  [place], [place], [place] [phone]   [facility]/Dr. Osei-Bonsu  [address] [place] or [address], [place] [phone] Phone number for both [place] and [place] locations is the same.  Urgent [facility] and [facility] [address] [place] [phone]   [facility] [address], [place] or [address] [phone]   [facility] [address] [place] [phone], phone; [phone], fax Sees patients 1st and 3rd Saturday of every month.  Must not qualify for public or private insurance (i.e. Medicaid, Medicare, [place] Health Choice, Veterans' Benefits)  Household income should be no more than 200% of the poverty level The clinic cannot treat you if you are pregnant or think you are pregnant  Sexually transmitted diseases are not treated at the clinic.    Dental Care: Organization         Address  Phone  Notes  [facility] Department of [facility] [phone] Accepts children up to age 39 who are enrolled in [place] or [place]; pregnant women with a Medicaid card; and children who have applied for Medicaid or [place] Health Choice, but were declined, whose parents can pay a reduced fee at time of service.  [facility] Department of [facility]  [address], [place] [phone] Accepts children up to age 43 who are enrolled in [place] or [place]; pregnant women with a Medicaid card; and children who have applied for Medicaid or [place] Health Choice, but were declined, whose parents can pay a reduced fee at time of service.  [facility]  [place] [phone] Patients are seen by appointment only. Walk-ins are not accepted. [place] will see patients 16 years of age and older. Monday - Tuesday (8am-5pm) Most Wednesdays (8:30-5pm) $30 per visit, cash only  [facility] Adult Dental Access PROGRAM  [address], [facility] [phone] Patients are seen by appointment only. Walk-ins are not accepted. [place] will see patients 40 years of age and older. One Wednesday  Evening (Monthly: Volunteer Based).  $30 per visit, cash only  [place]  [phone] for adults; Children under age 6, call Graduate Pediatric Dentistry at [phone]. Children aged 59-14, please call [phone] to request a pediatric application.  Dental services are provided in all areas of dental care including fillings, crowns and bridges, complete and partial dentures, implants, gum treatment, root canals, and extractions. Preventive care is also provided. Treatment is provided to both adults and children. Patients are selected via a lottery and there is often a waiting list.   [facility] [address], [place]  [phone] [email or web address]   [facility] [address] [place], [place] [phone], Ext. [phone] Second and Fourth Thursday of each month, opens at 6:30 AM; Clinic ends at 9 AM.  Patients are seen on a first-come first-served basis, and a limited number are seen during each clinic.   [facility]  [address] [place], [place] [phone]   Eligibility Requirements You must have lived in  [place], [place], or [place] counties for at least the last three months.   You cannot be eligible for state or federal sponsored [employer], including [employer], [place], or [employer].   You generally cannot be eligible for healthcare insurance through your employer.    How to apply: Eligibility screenings are held every Tuesday and Wednesday afternoon from 1:00 pm until 4:00 pm. You do not need an appointment for the interview!  [facility] [address] [place], [place]   [place]  [place] Department  [place]  [phone]    Behavioral Health Resources in the Community: Intensive Outpatient Programs Organization         Address  Phone  Notes  [place]. [address] [place], [place] [phone]   [facility] Outpatient [address], [place], [place]   ADS: [facility] [address], [place], [place]   [place] [address]  [place], [place] or [phone]   Substance Abuse Resources Organization         Address  Phone  Notes  Alcohol and Drug Services  [phone]   [place]  [phone]   [facility] [place]   Chinita Pester  [phone]   Residential & Outpatient Substance Abuse Program  [phone]   Psychological Services Organization         Address  Phone  Notes  [facility] [place]  [place]  [phone]   [place] [address] [place] or [phone]    Mobile Crisis Teams Organization         Address  Phone  Notes  [facility], Mobile Crisis Care Unit  [phone]   [facility]  [address] [place], [place]   [place] [address] [place] [phone]    Self-Help/Support Groups Organization         Address  Phone             Notes  [place]. of  - variety of support groups  [place] Call for more information  Narcotics Anonymous (NA), [facility] [address], [employer] [place]  2 meetings at this location  Residential Treatment Programs Organization         Address  Phone  Notes  [facility] [address],    [place]  [phone]   [facility]  [address] [place] [address], [place], [place]   [place], [place] [phone] Admissions: 8am-3pm M-F  Incentives Substance [place] [address]    [place], [place] [phone]   [facility] [address] [place], [place], [place]   [facility] [address]  [place], [place]   [facility] - Intensive Outpatient [address] Kristeen [address], [place], [place]   [facility] [place].,  [place], [place] [phone] or [phone]   [facility] (RTS) [address], [place], [place] Accepts Medicaid  [facility] [place] [address],  [place] [phone] Substance Abuse/Addiction Treatment   [facility] Organization         Address  Phone  Notes  [facility]  [phone]   Domenic Schwab, PhD [address] [place], [place]   [phone] or [phone]   [facility] [place], [place] [phone]   [facility] [address], [place], [place] [phone] Insurance/Medicaid/sponsorship through [facility] and Families [address] [place]                                    [place], [place] [phone] [place] [address], [place] [phone]    Dr. Adele Schilder  [phone]   [facility] Dept. 1) [address], [place] 2) [place] 3)  [place] [address], [place] [phone]  [phone]   [place] [phone] or [phone] (After Hours)

## 2013-05-29 NOTE — ED Notes (Signed)
Bed: SW10 Expected date:  Expected time:  Means of arrival:  Comments: EMS- Shingle pain

## 2013-05-29 NOTE — ED Notes (Signed)
Patient from [place] with pain from shingles she rates as an 8.  Pain on left flank radiating under left breast area.

## 2013-05-29 NOTE — ED Provider Notes (Signed)
CSN: [id number]     Arrival date & time 05/29/13  1432 History   First MD Initiated Contact with Patient 05/29/13 1504     Chief Complaint  Patient presents with  . Pain     (Consider location/radiation/quality/duration/timing/severity/associated sxs/prior Treatment) The history is provided by the patient. No language interpreter was used.  [name] is a 68 y/o F with PMHx of colon cancer with mets to the lvier, HTN, asthma, DVT, anemia arthritis, clotting disorder presenting to the ED with left flank pain that has been ongoing since the patient was diagnosed with shingles. Patient reported that the pain is localized to the left flank with radiation to the left side of the back described as a shooting pain that is constant. Patient reported that she recently filled her prescription for Oxycodone and has a couple of pills left and stated that she ran out of her Fentanyl patches. Patient has a fentanyl patch on and a lidocaine patch on the left flank. Patient was diagnosed with shingles in December 2014 - this will be the patient's sixth visit to the ED for post-herpetic neuralgia. Reported feeling mildly lightheaded over the past month. Bowel movement was this morning-reported normal. Denied fever, chills, nausea, vomiting, diarrhea, melena, hematochezia or bowel movement symptoms, headache, blurred vision, neck pain, back pain, changes to eating or drinking habits, sudden loss of vision, numbness, tingling. PCP Dr. Katherine Roan Patient has an appointment with Dr. Katherine Roan on Thursday, 05/31/2013  Past Medical History  Diagnosis Date  . 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 Hypertension   . Colon cancer   . Asthma   . DVT (deep venous thrombosis)     right jugular  . Anemia   . Arthritis   . Clotting disorder   . Seizures   . PONV (postoperative nausea and vomiting)   . Spinal headache   . Shingles rash    Past Surgical History  Procedure Laterality  Date  . Abdominal hysterectomy  2012  . Small intestine surgery    . Colonoscopy    . Portacath placement    . Colon surgery    . Radiofrequency ablation liver tumor     Family History  Problem Relation Age of Onset  . Hypertension Father   . Cancer Brother     colon  . Cancer Daughter     colon  . Breast cancer Sister    History  Substance Use Topics  . Smoking status: Never Smoker   . Smokeless tobacco: Never Used  . Alcohol Use: No   OB History   Grav Para Term Preterm Abortions TAB SAB Ect Mult Living                 Review of Systems  Constitutional: Negative for fever and chills.  Respiratory: Negative for chest tightness and shortness of breath.   Cardiovascular: Negative for chest pain.  Gastrointestinal: Positive for abdominal pain. Negative for nausea, vomiting, diarrhea, constipation, blood in stool and anal bleeding.  Genitourinary: Negative for dysuria and decreased urine volume.  Musculoskeletal: Positive for back pain. Negative for neck pain.  Neurological: Positive for light-headedness. Negative for dizziness and weakness.  All other systems reviewed and are negative.      Allergies  Review of patient's allergies indicates no known allergies.  Home Medications   Current Outpatient Rx  Name  Route  Sig  Dispense  Refill  . albuterol (PROVENTIL HFA;VENTOLIN HFA) 108 (90  BASE) MCG/ACT inhaler   Inhalation   Inhale 2 puffs into the lungs every 6 (six) hours as needed for wheezing or shortness of breath.          Marland Kitchen aspirin EC 81 MG tablet   Oral   Take 81 mg by mouth daily.         . Azilsartan-Chlorthalidone (EDARBYCLOR) 40-25 MG TABS   Oral   Take 1 tablet by mouth daily.         . cloNIDine (CATAPRES) 0.1 MG tablet   Oral   Take 0.1 mg by mouth daily.          Marland Kitchen diltiazem (CARDIZEM CD) 180 MG 24 hr capsule   Oral   Take 180 mg by mouth every morning.          . fentaNYL (DURAGESIC - DOSED MCG/HR) 50 MCG/HR   Transdermal    Place 1 patch (50 mcg total) onto the skin every 3 (three) days.   5 patch   0   . ferrous sulfate 325 (65 FE) MG tablet   Oral   Take 325 mg by mouth 3 (three) times daily with meals.         . Fluticasone-Salmeterol (ADVAIR) 100-50 MCG/DOSE AEPB   Inhalation   Inhale 2 puffs into the lungs 2 (two) times daily as needed. WHEEZING          . gabapentin (NEURONTIN) 400 MG capsule   Oral   Take 400 mg by mouth 4 (four) times daily.         Marland Kitchen labetalol (NORMODYNE) 200 MG tablet   Oral   Take 200 mg by mouth 2 (two) times daily.         Marland Kitchen levETIRAcetam (KEPPRA) 250 MG tablet   Oral   Take 250 mg by mouth every 12 (twelve) hours.          . lidocaine (LIDODERM) 5 %   Transdermal   Place 1 patch onto the skin daily. Remove & Discard patch within 12 hours or as directed by MD   30 patch   0   . Multiple Vitamins-Minerals (CENTRUM SILVER ADULT 50+ PO)   Oral   Take 1 tablet by mouth daily.         . nebivolol (BYSTOLIC) 10 MG tablet   Oral   Take 10 mg by mouth at bedtime. Take if  SBP > 145  .  Take at  Bedtime.         . ondansetron (ZOFRAN) 4 MG tablet   Oral   Take 1 tablet (4 mg total) by mouth every 6 (six) hours.   12 tablet   0   . oxyCODONE-acetaminophen (PERCOCET) 7.5-325 MG per tablet   Oral   Take 1 tablet by mouth every 6 (six) hours as needed for pain.         . polyethylene glycol (MIRALAX / GLYCOLAX) packet   Oral   Take 17 g by mouth 3 (three) times daily as needed for mild constipation.         . potassium chloride SA (K-DUR,KLOR-CON) 20 MEQ tablet   Oral   Take 20 mEq by mouth 3 (three) times daily.          . hydrocortisone cream 1 %      Apply underneath right breast 2 times daily   15 g   0    BP 141/67  Pulse 62  Temp(Src) 98.3 F (36.8 C) (Oral)  Resp 16  SpO2 97% Physical Exam  Nursing note and vitals reviewed. Constitutional: She is oriented to person, place, and time. She appears well-developed and  well-nourished. No distress.  When this provider walked into the room patient was sleeping comfortably in bed  HENT:  Head: Normocephalic and atraumatic.  Mouth/Throat: Oropharynx is clear and moist. No oropharyngeal exudate.  Eyes: Conjunctivae and EOM are normal. Pupils are equal, round, and reactive to light. Right eye exhibits no discharge. Left eye exhibits no discharge.  Negative nystagmus Visual fields grossly intact  Neck: Normal range of motion. Neck supple. No tracheal deviation present.  Negative neck stiffness Negative nuchal rigidity Negative cervical lymphadenopathy Negative meningeal signs  Cardiovascular: Normal rate, regular rhythm and normal heart sounds.  Exam reveals no friction rub.   No murmur heard. Pulses:      Radial pulses are 2+ on the right side, and 2+ on the left side.       Dorsalis pedis pulses are 2+ on the right side, and 2+ on the left side.  Cap refill less than 3 seconds  Pulmonary/Chest: Effort normal and breath sounds normal. No respiratory distress. She has no wheezes. She has no rales.  Patient is able to speak in full sentences without difficulty Negative stridor Negative use of accessory muscles  Abdominal: Soft. Bowel sounds are normal. There is no tenderness. There is no guarding.  Musculoskeletal: Normal range of motion.  Full ROM to upper and lower extremities without difficulty noted, negative ataxia noted.  Lymphadenopathy:    She has no cervical adenopathy.  Neurological: She is alert and oriented to person, place, and time. She exhibits normal muscle tone. Coordination normal.  Skin: Skin is warm. Rash noted. She is not diaphoretic.     Scarred rash to follow a dermatomal pattern of the left flank-negative swelling, erythema, inflammation, cellulitic findings noted. Negative active shingles noted.   Eczema rash localized underneath right breast.  Psychiatric: She has a normal mood and affect. Her behavior is normal. Thought content  normal.    ED Course  Procedures (including critical care time)  5:10 PM Attending physician saw and assessed patient. As per attending physician, cleared patient for discharge.   Labs Review Labs Reviewed - No data to display Imaging Review No results found.  EKG Interpretation   None       MDM   Final diagnoses:  Post herpetic neuralgia  Eczema   Medications  sodium chloride 0.9 % bolus 1,000 mL (1,000 mLs Intravenous New Bag/Given 05/29/13 1618)  fentaNYL (SUBLIMAZE) injection 25 mcg (25 mcg Intravenous Given 05/29/13 1622)   Filed Vitals:   05/29/13 1433 05/29/13 1652 05/29/13 1756  BP: 124/62 123/71 141/67  Pulse: 64 69 62  Temp: 98.3 F (36.8 C)    TempSrc: Oral    Resp: _0 SpO2: 97% 97% 97%    Patient presenting to the ED with pain localize the left flank following a dermatomal pattern - consistent with postherpetic neuralgia. Reported that the pain is a sharp shooting pain radiating from the left flank to the back and underneath her left breast that is constant. Patient is been prescribed lidocaine patches, Sentinel patches, oxycodone-reported that the pain is only dulled down - continues to feel the pain in the left flank. Patient said to be followed up with her primary care provider-has appointment with Dr. Katherine Roan on Thursday, 05/31/2013. This provider reviewed patient's chart. Patient was diagnosed with shingles of the left flank following a dermatome  pattern on 04/04/2013. Patient was seen and assessed in the ED on 04/14/2013, 05/10/2013, 05/15/2013, 05/18/2013 regarding postherpetic neuralgia. Each time patient was prescribed medications-continues to followup primary care provider. Alert and oriented. GCS 15. Heart rate and rhythm normal. Lungs clear to auscultation. Patient able to speak in full sentences without difficulty-negative use of accessory muscles, or signs of respiratory distress. Radial and DP pulses 2+ bilaterally. Cap refill less than 3  seconds. Eczema related rash localized to underneath the right breast-reported in itching sensation that is constant-denied pain. Scarred over, healed rash to the left flank following a dermatome pattern-healed shingles rash. Pain upon palpation to this region-when touched patient begins to tear. Discussed case with attending physician who recommended labs not to be drawn - agreed to IV fluids and one round of pain medications - patient to be discharged with outpatient follow-up.  Based on patient's presentation and previous visits to the ED this appears to be a chronic issue beginning. Patient given one round of pain medications. Patient stable, afebrile. Negative findings or changes to rash. Negative acute findings. Benign abdominal exam-doubt acute abdomen, negative peritoneal signs-nonsurgical abdomen noted upon exam. Patient not septic appearing. Discharged patient. Discussed with patient to continue to take lidocaine patches, oxycodone that she was prescribed with. Patient requested fentanyl patches to be prescribed-this provider disagreed and reported to bring this up with her primary care provider at her appointment on Thursday, 05/31/2013. Stressed the patient to keep this appointment with her primary care provider-discussed that she may need possible pain management this will need to be discussed with her primary care provider. Discussed with patient to rest and stay hydrated. Discussed with patient to avoid mixing medications. Discussed with patient to avoid any physical or strenuous activity. Discharged patient with hydrocortisone cream to be applied underneath her right breast-patient presented with eczema. Discussed with patient to closely monitor symptoms and if symptoms are to worsen or change to report back to the ED - strict return instructions given.  Patient agreed to plan of care, understood, all questions answered.   Jamse Mead, PA-C 05/30/13 1503

## 2013-05-31 NOTE — ED Provider Notes (Signed)
Medical screening examination/treatment/procedure(s) were conducted as a shared visit with non-physician practitioner(s) and myself.  I personally evaluated the patient during the encounter.  EKG Interpretation   None       Patient with poorly controlled pain s/p shingles. Has been ongoing. She currently has fentanyl patches and is requesting refill. Will not refill this. Given one dose of pain meds in ED, but has f/u in next day or so with her PCP. Do not feel there are any acute emergent issues at this time, she appears comfortable during my exam.  Ephraim Hamburger, MD 05/31/13 [phone]

## 2013-06-07 ENCOUNTER — Other Ambulatory Visit: Payer: Self-pay | Admitting: Nurse Practitioner

## 2013-06-07 ENCOUNTER — Telehealth: Payer: Self-pay | Admitting: Neurology

## 2013-06-07 NOTE — Telephone Encounter (Signed)
I spoke to patient and she requested a new prescription for Levetiracetam.  The patient has not been seen since 05-07-13.  I explained that she needed an appointment scheduled but I would request a one time refill for medication until she can be seen.

## 2013-06-07 NOTE — Telephone Encounter (Signed)
Pt called requesting a refill on her levETIRAcetam (KEPPRA) 250 MG tablet. I have scheduled pt for next week visit w/CM, please call pt when prescription has been called in.

## 2013-06-07 NOTE — Telephone Encounter (Signed)
Pt called is needing to get a refill on her

## 2013-06-07 NOTE — Telephone Encounter (Signed)
Patient has scheduled an appt, Rx has been sent.

## 2013-06-12 ENCOUNTER — Encounter: Payer: Self-pay | Admitting: Nurse Practitioner

## 2013-06-12 ENCOUNTER — Ambulatory Visit (INDEPENDENT_AMBULATORY_CARE_PROVIDER_SITE_OTHER): Payer: Medicare Other | Admitting: Nurse Practitioner

## 2013-06-12 VITALS — BP 98/60 | HR 76 | Ht 63.0 in | Wt 165.0 lb

## 2013-06-12 DIAGNOSIS — G40309 Generalized idiopathic epilepsy and epileptic syndromes, not intractable, without status epilepticus: Secondary | ICD-10-CM

## 2013-06-12 MED ORDER — LEVETIRACETAM 250 MG PO TABS: 250.0000 mg | ORAL_TABLET | Freq: Two times a day (BID) | ORAL | Status: DC

## 2013-06-12 NOTE — Patient Instructions (Signed)
Continue Keppra at current dose will refill for one year Followup in one year and when necessary

## 2013-06-12 NOTE — Progress Notes (Signed)
[facility]  PATIENT: [name] DOB: Jan 13, 1946   REASON FOR VISIT: Followup for seizure disorder   HISTORY OF PRESENT ILLNESS: [name], 68 year old female returns for followup she has history of seizure disorder and is on Keppra 250 mg twice daily with no seizure activity in several years. She denies any side effects to the medication. She returns today with her daughter. She continues to live alone. She is independent with her activities of daily living.  MRI of the brain 11/19/10 shows non specific  subcortical and periventricular white matter hypertensities likely from chronic microvascular disease. EEG normal. She returns for reevaluation   HISTORY:  She has past medical history of hypertension, colon cancer in 2011, status post surgery, chemotherapy, she also underwent hysterectomy in February 2012 for uterus prolapse, she presented with seizure in May 27, 2010. She was discharged from nursing home after her hysterectomy due to the needs for prolonged antibiotic infusion,she is not sure the name of the medications.  in May 27, 2010 while at nursing home, walking in her room in the early afternoon, without warning signs, she had one seizure, was taken to the emergency room. She had no recollection of what has happened, wake up in the emergency room confused. CAT scan without contrast of brain was normal. Laboratory evaluation has demonstrate decreased white count 2.6, anemia hemoglobin 9.3, hematocrit 29.7, UA showed UTI, moderate esterase, she apparently was started on Keppra 250 mg b.i.d. 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 albuterol (PROVENTIL HFA;VENTOLIN HFA) 108 (90 BASE) MCG/ACT inhaler Inhale 2 puffs into the lungs every 6 (six) hours as needed for wheezing or shortness of breath.       Marland Kitchen aspirin EC 81 MG tablet Take 81 mg by mouth daily.      . cloNIDine (CATAPRES) 0.1 MG tablet Take 0.1 mg by mouth daily.       Marland Kitchen diltiazem (CARDIZEM CD) 180 MG 24 hr capsule Take 180 mg by mouth every morning.       . fentaNYL (DURAGESIC - DOSED MCG/HR) 50 MCG/HR Place 1 patch (50 mcg total) onto the skin every 3 (three) days.  5 patch  0  . ferrous sulfate 325 (65 FE) MG tablet Take 325 mg by mouth 3 (three) times daily with meals.      . Fluticasone-Salmeterol (ADVAIR) 100-50 MCG/DOSE AEPB Inhale 2 puffs into the lungs 2 (two) times daily as needed. WHEEZING       . gabapentin (NEURONTIN) 400 MG capsule Take 400 mg by mouth 4 (four) times daily.      . hydrocortisone cream 1 % Apply underneath right breast 2 times daily  15 g  0  . labetalol (NORMODYNE) 200 MG tablet Take 200 mg by mouth 2 (two) times daily.      Marland Kitchen levETIRAcetam (KEPPRA) 250 MG tablet take 1 tablet by mouth  twice a day  60 tablet  0  . lidocaine (LIDODERM) 5 % Place 1 patch onto the skin daily. Remove & Discard patch within 12 hours or as directed by MD  30 patch  0  . Multiple Vitamins-Minerals (CENTRUM SILVER ADULT 50+ PO) Take 1 tablet by mouth daily.      . nebivolol (BYSTOLIC) 10 MG tablet Take 10 mg by mouth at bedtime. Take if  SBP > 145  .  Take at  Bedtime.      . ondansetron (ZOFRAN) 4 MG tablet Take 1 tablet (4 mg total) by mouth every 6 (six) hours.  12  tablet  0  . oxyCODONE-acetaminophen (PERCOCET) 7.5-325 MG per tablet Take 1 tablet by mouth every 6 (six) hours as needed for pain.      . polyethylene glycol (MIRALAX / GLYCOLAX) packet Take 17 g by mouth 3 (three) times daily as needed for mild constipation.      . potassium chloride SA (K-DUR,KLOR-CON) 20 MEQ tablet Take 20 mEq by mouth 3 (three) times daily.       . Azilsartan-Chlorthalidone (EDARBYCLOR) 40-25 MG TABS Take 1 tablet by mouth daily.       No facility-administered medications prior to visit.    PAST MEDICAL HISTORY: Past Medical History  Diagnosis Date  . 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 Hypertension   . Colon cancer   . Asthma   . DVT (deep venous thrombosis)     right jugular  . Anemia   . Arthritis   . Clotting disorder   . Seizures   . PONV (postoperative nausea and vomiting)   . Spinal headache   . Shingles rash     PAST SURGICAL HISTORY: Past Surgical History  Procedure Laterality Date  . Abdominal hysterectomy  2012  . Small intestine surgery    . Colonoscopy    . Portacath placement    . Colon surgery    . Radiofrequency ablation liver tumor      FAMILY HISTORY: Family History  Problem Relation Age of Onset  . Hypertension Father   . Cancer Brother     colon  . Cancer Daughter     colon  . Breast cancer Sister     SOCIAL HISTORY: History   Social History  . Marital Status: Divorced    Spouse Name: N/A    Number of Children: 3  . Years of Education: 12+   Occupational History  . Retired    Social History Main Topics  . Smoking status: Never Smoker   . Smokeless tobacco: Never Used  . Alcohol Use: No  . Drug Use: No  . Sexual Activity: No   Other Topics Concern  . Not on file   Social History Narrative   Patient lives at home alone.    Patient is divorced.    Patient has 3 children.    Patient has some college.    Patient is retired.      PHYSICAL EXAM  Filed Vitals:   06/12/13  1336  BP: 98/60  Pulse: 76  Height: $Remove'5\' 3"'[id number]$  (1.6 m)  Weight: 165 lb (74.844 kg)   Body mass index is 29.24 kg/(m^2).  Generalized: Well developed, in no acute distress   Neurological examination   Mentation: Alert oriented to time, place, history taking. Follows all commands speech and language fluent  Cranial nerve II-XII: Pupils were equal round reactive to light extraocular movements were  full, visual field were full on confrontational test. Facial sensation and strength were normal. hearing was intact to finger rubbing bilaterally. Uvula tongue midline. head turning and shoulder shrug were normal and symmetric.Tongue protrusion into cheek strength was normal. Motor: normal bulk and tone, full strength in the BUE, BLE,  No focal weakness Coordination: finger-nose-finger, heel-to-shin bilaterally, no dysmetria Reflexes: Brachioradialis 2/2, biceps 2/2, triceps 2/2, patellar 2/2, Achilles 2/2, plantar responses were flexor bilaterally. Gait and Station: Rising up from seated position without assistance, normal stance,  moderate stride, good arm swing, smooth turning, able to perform tiptoe, and heel walking without difficulty. Tandem gait is steady. No assistive device  DIAGNOSTIC DATA (LABS, IMAGING, TESTING) - I reviewed patient records, labs, notes, testing and imaging myself where available.  Lab Results  Component Value Date   WBC 3.1* 03/26/2013   HGB 12.5 03/26/2013   HCT 35.2* 03/26/2013   MCV 91.4 03/26/2013   PLT 192 03/26/2013      Component Value Date/Time   NA 129* 03/26/2013 1200   NA 133* 01/02/2013 1359   NA 142 06/10/2011 1152   K 4.0 03/26/2013 1200   K 3.8 01/02/2013 1359   K 4.4 06/10/2011 1152   CL 93* 03/26/2013 1200   CL 106 06/19/2012 0936   CL 96* 06/10/2011 1152   CO2 27 03/26/2013 1200   CO2 25 01/02/2013 1359   CO2 29 06/10/2011 1152   GLUCOSE 113* 03/26/2013 1200   GLUCOSE 96 01/02/2013 1359   GLUCOSE 93 06/19/2012 0936   GLUCOSE 88 06/10/2011 1152   BUN  11 03/26/2013 1200   BUN 8.6 01/02/2013 1359   BUN 12 06/10/2011 1152   CREATININE 0.75 03/26/2013 1200   CREATININE 0.8 01/02/2013 1359   CREATININE 0.7 06/10/2011 1152   CALCIUM 9.2 03/26/2013 1200   CALCIUM 8.7 01/02/2013 1359   CALCIUM 8.8 06/10/2011 1152   PROT 7.7 03/26/2013 1200   PROT 6.9 01/02/2013 1359   PROT 7.5 06/10/2011 1152   ALBUMIN 3.7 03/26/2013 1200   ALBUMIN 3.0* 01/02/2013 1359   AST 24 03/26/2013 1200   AST 21 01/02/2013 1359   AST 23 06/10/2011 1152   ALT 12 03/26/2013 1200   ALT 13 01/02/2013 1359   ALT 21 06/10/2011 1152   ALKPHOS 55 03/26/2013 1200   ALKPHOS 59 01/02/2013 1359   ALKPHOS 53 06/10/2011 1152   BILITOT 0.5 03/26/2013 1200   BILITOT 0.45 01/02/2013 1359   BILITOT 0.50 06/10/2011 1152   GFRNONAA 84* 03/26/2013 1200   GFRAA >90 03/26/2013 1200      ASSESSMENT AND PLAN  68 y.o. year old female  has a past medical history of met colon ca to liver (dx'd 02/25/09); presenting with 1 episode of seizure. Currently on Keppra 250 twice daily without further events. Normal neurologic exam. MRI of the brain 11/19/10 shows non specific  subcortical and periventricular white matter hypertensities likely from chronic microvascular disease. EEG normal.   Continue Keppra at current dose will refill for one year Followup in one year and when necessary Dennie Bible, [facility], [facility] [address], [place], [place] [address] [phone]

## 2013-07-02 ENCOUNTER — Other Ambulatory Visit (HOSPITAL_BASED_OUTPATIENT_CLINIC_OR_DEPARTMENT_OTHER): Payer: Medicare Other

## 2013-07-02 ENCOUNTER — Ambulatory Visit (HOSPITAL_COMMUNITY)
Admission: RE | Admit: 2013-07-02 | Discharge: 2013-07-02 | Disposition: A | Discharge status: Home / Self Care | Payer: Medicare Other | Source: Ambulatory Visit | Attending: Oncology | Admitting: Oncology

## 2013-07-02 ENCOUNTER — Encounter (HOSPITAL_COMMUNITY): Payer: Self-pay

## 2013-07-02 DIAGNOSIS — K439 Ventral hernia without obstruction or gangrene: Secondary | ICD-10-CM | POA: Insufficient documentation

## 2013-07-02 DIAGNOSIS — C189 Malignant neoplasm of colon, unspecified: Secondary | ICD-10-CM | POA: Insufficient documentation

## 2013-07-02 DIAGNOSIS — D1803 Hemangioma of intra-abdominal structures: Secondary | ICD-10-CM | POA: Insufficient documentation

## 2013-07-02 DIAGNOSIS — Z9049 Acquired absence of other specified parts of digestive tract: Secondary | ICD-10-CM | POA: Insufficient documentation

## 2013-07-02 LAB — CBC WITH DIFFERENTIAL/PLATELET
BASO%: 0.4 % (ref 0.0–2.0)
Basophils Absolute: 0 10*3/uL (ref 0.0–0.1)
EOS ABS: 0 10*3/uL (ref 0.0–0.5)
EOS%: 0 % (ref 0.0–7.0)
HEMATOCRIT: 34.9 % (ref 34.8–46.6)
HGB: 11.7 g/dL (ref 11.6–15.9)
LYMPH%: 21.3 % (ref 14.0–49.7)
MCH: 31.7 pg (ref 25.1–34.0)
MCHC: 33.5 g/dL (ref 31.5–36.0)
MCV: 94.6 fL (ref 79.5–101.0)
MONO#: 0.2 10*3/uL (ref 0.1–0.9)
MONO%: 8.8 % (ref 0.0–14.0)
NEUT#: 1.9 10*3/uL (ref 1.5–6.5)
NEUT%: 69.5 % (ref 38.4–76.8)
Platelets: 168 10*3/uL (ref 145–400)
RBC: 3.69 10*6/uL — ABNORMAL LOW (ref 3.70–5.45)
RDW: 12.5 % (ref 11.2–14.5)
WBC: 2.7 10*3/uL — AB (ref 3.9–10.3)
lymph#: 0.6 10*3/uL — ABNORMAL LOW (ref 0.9–3.3)

## 2013-07-02 LAB — COMPREHENSIVE METABOLIC PANEL (CC13)
ALT: 6 U/L (ref 0–55)
ANION GAP: 8 meq/L (ref 3–11)
AST: 14 U/L (ref 5–34)
Albumin: 3.4 g/dL — ABNORMAL LOW (ref 3.5–5.0)
Alkaline Phosphatase: 48 U/L (ref 40–150)
BILIRUBIN TOTAL: 0.47 mg/dL (ref 0.20–1.20)
BUN: 9 mg/dL (ref 7.0–26.0)
CO2: 25 meq/L (ref 22–29)
Calcium: 8.7 mg/dL (ref 8.4–10.4)
Chloride: 106 mEq/L (ref 98–109)
Creatinine: 0.7 mg/dL (ref 0.6–1.1)
GLUCOSE: 108 mg/dL (ref 70–140)
Potassium: 4.1 mEq/L (ref 3.5–5.1)
Sodium: 138 mEq/L (ref 136–145)
Total Protein: 6.3 g/dL — ABNORMAL LOW (ref 6.4–8.3)

## 2013-07-02 MED ORDER — IOHEXOL 300 MG/ML  SOLN
100.0000 mL | Freq: Once | INTRAMUSCULAR | Status: AC | PRN
  Administered 2013-07-02: 100 mL via INTRAVENOUS

## 2013-07-03 ENCOUNTER — Encounter: Payer: Self-pay | Admitting: Oncology

## 2013-07-03 ENCOUNTER — Ambulatory Visit (HOSPITAL_BASED_OUTPATIENT_CLINIC_OR_DEPARTMENT_OTHER): Payer: Medicare Other | Admitting: Oncology

## 2013-07-03 ENCOUNTER — Telehealth: Payer: Self-pay | Admitting: Oncology

## 2013-07-03 VITALS — BP 110/61 | HR 78 | Temp 98.5°F | Resp 20 | Ht 63.0 in | Wt 160.2 lb

## 2013-07-03 DIAGNOSIS — D72819 Decreased white blood cell count, unspecified: Secondary | ICD-10-CM

## 2013-07-03 DIAGNOSIS — C189 Malignant neoplasm of colon, unspecified: Secondary | ICD-10-CM

## 2013-07-03 DIAGNOSIS — Z86718 Personal history of other venous thrombosis and embolism: Secondary | ICD-10-CM

## 2013-07-03 DIAGNOSIS — C787 Secondary malignant neoplasm of liver and intrahepatic bile duct: Secondary | ICD-10-CM

## 2013-07-03 DIAGNOSIS — D696 Thrombocytopenia, unspecified: Secondary | ICD-10-CM

## 2013-07-03 NOTE — Telephone Encounter (Signed)
[facility] APPT SCHEDULE FOR SEPT °

## 2013-07-03 NOTE — Progress Notes (Signed)
Hematology and Oncology Follow Up Visit  [name] [id number] 1946/01/23 68 y.o. 07/03/2013 10:07 AM   Principle Diagnosis: 68 year old woman with stage IV colon cancer with a history of an isolated metastasis to the liver diagnosed in 04/2009 She is currently NED.   Prior Therapy:  She is status post colectomy as well as radiofrequency ablation to an isolated liver met on 05/05/2009 at [facility].  She is status post chemotherapy with FOLFOX 6 and Avastin from 06/08/2009 through 12/19/2009 requiring dose reduction for thrombocytopenia and leukopenia. CT scan findings were retroperitoneal and anterior adenopathy status post CT guided core biopsy on 03/15/2011 showed no evidence  of malignancy except for fibrosis. 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   Current therapy: Observation and follow up.   Interim History: [name] presents today for a follow up visit. She is a nice women with the above history. Since her last visit she has been doing well without any new complications. She was diagnosed with shingles and completed the course of treatment for that. She did suffer a postherpetic neuralgia which is improving at this time. She is not reporting any new seizures at this time and continue to be on seizure medication. She report no GI symptoms, no bleeding or weight loss. Her energy levels are fair. She has no nausea, vomiting or abdominal pain. She has had no changes of bowel function. She is still very functional and lives independently. She was able to drive and continue to do so as well as attend to her activity of daily living. She has not reported any bleeding problems nor any other constitutional symptoms.   Medications: I have reviewed the patient's current medications. Current Outpatient Prescriptions  Medication Sig Dispense Refill  . albuterol (PROVENTIL HFA;VENTOLIN HFA) 108 (90 BASE) MCG/ACT inhaler Inhale 2 puffs  into the lungs every 6 (six) hours as needed for wheezing or shortness of breath.       Marland Kitchen aspirin EC 81 MG tablet Take 81 mg by mouth daily.      . Azilsartan-Chlorthalidone (EDARBYCLOR) 40-25 MG TABS Take 1 tablet by mouth daily.      . cloNIDine (CATAPRES) 0.1 MG tablet Take 0.1 mg by mouth daily.       Marland Kitchen diltiazem (CARDIZEM CD) 180 MG 24 hr capsule Take 180 mg by mouth every morning.       . fentaNYL (DURAGESIC - DOSED MCG/HR) 50 MCG/HR Place 1 patch (50 mcg total) onto the skin every 3 (three) days.  5 patch  0  . ferrous sulfate 325 (65 FE) MG tablet Take 325 mg by mouth 3 (three) times daily with meals.      . Fluticasone-Salmeterol (ADVAIR) 100-50 MCG/DOSE AEPB Inhale 2 puffs into the lungs 2 (two) times daily as needed. WHEEZING       . gabapentin (NEURONTIN) 400 MG capsule Take 400 mg by mouth 4 (four) times daily.      . hydrocortisone cream 1 % Apply underneath right breast 2 times daily  15 g  0  . labetalol (NORMODYNE) 200 MG tablet Take 200 mg by mouth 2 (two) times daily.      Marland Kitchen levETIRAcetam (KEPPRA) 250 MG tablet Take 1 tablet (250 mg total) by mouth 2 (two) times daily.  60 tablet  11  . lidocaine (LIDODERM) 5 % Place 1 patch onto the skin daily. Remove & Discard patch within 12 hours or as directed by MD  30 patch  0  . lidocaine (XYLOCAINE) 2 % jelly       . meloxicam (MOBIC) 15 MG tablet       . Multiple Vitamins-Minerals (CENTRUM SILVER ADULT 50+ PO) Take 1 tablet by mouth daily.      . nebivolol (BYSTOLIC) 10 MG tablet Take 10 mg by mouth at bedtime. Take if  SBP > 145  .  Take at  Bedtime.      . ondansetron (ZOFRAN) 4 MG tablet Take 1 tablet (4 mg total) by mouth every 6 (six) hours.  12 tablet  0  . oxyCODONE-acetaminophen (PERCOCET) 7.5-325 MG per tablet Take 1 tablet by mouth every 6 (six) hours as needed for pain.      . polyethylene glycol (MIRALAX / GLYCOLAX) packet Take 17 g by mouth 3 (three) times daily as needed for mild constipation.      . potassium chloride  SA (K-DUR,KLOR-CON) 20 MEQ tablet Take 20 mEq by mouth 3 (three) times daily.        No current facility-administered medications for this visit.    Allergies: No Known Allergies  Past Medical History, Surgical history, Social history, and Family History were reviewed and updated.  Review of Systems:  Remaining ROS negative. Physical Exam: Blood pressure 110/61, pulse 78, temperature 98.5 F (36.9 C), temperature source Oral, resp. rate 20, height _0  (1.6 m), weight 160 lb 3.2 oz (72.666 kg). ECOG: 1 General appearance: alert Head: Normocephalic, without obvious abnormality, atraumatic Neck: no adenopathy, no carotid bruit, no JVD, supple, symmetrical, trachea midline and thyroid not enlarged, symmetric, no tenderness/mass/nodules Lymph nodes: Cervical, supraclavicular, and axillary nodes normal. Heart:regular rate and rhythm, S1, S2 normal, no murmur, click, rub or gallop Lung:chest clear, no wheezing, rales, normal symmetric air entry Abdomin: soft, non-tender, without masses or organomegaly EXT:no erythema, induration, or nodules   Lab Results: Lab Results  Component Value Date   WBC 2.7* 07/02/2013   HGB 11.7 07/02/2013   HCT 34.9 07/02/2013   MCV 94.6 07/02/2013   PLT 168 07/02/2013     Chemistry      Component Value Date/Time   NA 138 07/02/2013 0959   NA 129* 03/26/2013 1200   NA 142 06/10/2011 1152   K 4.1 07/02/2013 0959   K 4.0 03/26/2013 1200   K 4.4 06/10/2011 1152   CL 93* 03/26/2013 1200   CL 106 06/19/2012 0936   CL 96* 06/10/2011 1152   CO2 25 07/02/2013 0959   CO2 27 03/26/2013 1200   CO2 29 06/10/2011 1152   BUN 9.0 07/02/2013 0959   BUN 11 03/26/2013 1200   BUN 12 06/10/2011 1152   CREATININE 0.7 07/02/2013 0959   CREATININE 0.75 03/26/2013 1200   CREATININE 0.7 06/10/2011 1152      Component Value Date/Time   CALCIUM 8.7 07/02/2013 0959   CALCIUM 9.2 03/26/2013 1200   CALCIUM 8.8 06/10/2011 1152   ALKPHOS 48 07/02/2013 0959   ALKPHOS 55 03/26/2013 1200    ALKPHOS 53 06/10/2011 1152   AST 14 07/02/2013 0959   AST 24 03/26/2013 1200   AST 23 06/10/2011 1152   ALT 6 07/02/2013 0959   ALT 12 03/26/2013 1200   ALT 21 06/10/2011 1152   BILITOT 0.47 07/02/2013 0959   BILITOT 0.5 03/26/2013 1200   BILITOT 0.50 06/10/2011 1152      EXAM:  CT CHEST, ABDOMEN, AND PELVIS WITH CONTRAST  TECHNIQUE:  Multidetector CT imaging of the chest, abdomen and pelvis was  performed following  the standard protocol during bolus  administration of intravenous contrast.  CONTRAST: 151m OMNIPAQUE IOHEXOL 300 MG/ML SOLN  COMPARISON: DG THORACIC SPINE 4V dated 04/04/2013; CT CHEST W/CM  dated 06/19/2012; NM PET IMAGE RESTAG (PS) SKULL BASE TO THIGH dated  03/02/2011; CT ABD/PELVIS W CM dated 06/10/2011  FINDINGS:  CT CHEST FINDINGS  No axillary or supraclavicular lymphadenopathy. No mediastinal hilar  lymphadenopathy. No pericardial fluid. Esophagus is normal. Review  of the lung parenchyma demonstrates no suspicious pulmonary nodules.  CT ABDOMEN AND PELVIS FINDINGS  There is a large hemangioma in the right hepatic lobe measuring up  to 12 cm unchanged from prior. Low-density lesion along the  falciform ligament is rounded measuring 33 mm and unchanged  (presumed ablation site). Additional subtle hypodensity in the  superior right hepatic lobe measuring less than 5 mm (image 42) is  unchanged. No new new hepatic lesions are present.  The pancreas, gallbladder, spleen, adrenal glands, kidneys are  normal.  Stomach, small bowel, appendix, and cecum are normal. Partial left  colectomy. No evidence obstruction or nodularity of the anastomosi.  There is a ventral abdominal hernia which contains a short segment  of nonobstructed small bowel (image 73). The amount of bowel and in  the small hernia sac is increased.  Abdominal aorta is normal caliber. No retroperitoneal periportal  lymphadenopathy.  No free fluid the pelvis. Post hysterectomy anatomy. The bladder is   normal. No pelvic lymphadenopathy. No aggressive osseous lesion.  IMPRESSION:  1. No evidence of thoracic metastasis.  2. Stable radiofrequency ablation site in the right hepatic lobe.  3. Large benign liver hemangioma and smaller hypodensity which is  also likely benign.  4. Interval enlargement of ventral hernia which contains a short  segment of small bowel. This could be at risk for obstruction in the  future.  5. No evidence of colon cancer recurrence at the left colectomy  site.   Impression and Plan: 68year old woman with:  1. Stage IV colon cancer with a history of an isolated metastasis to the liver. Status post colectomy as well as radiofrequency ablation to an isolated liver [facility] Status post chemotherapy with FOLFOX 6 and Avastin from 06/08/2009 through 12/19/2009. Her CT scan from 07/02/2013 was reviewed today and discussed with the patient and continued to show no evidence of disease. For now, we will continue active follow up and with repeat exam, labs in 6 months and CT will be repeated in 12 months.  2. 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 Her port has been removed on 06/2011.   3. Leukocytopenia and a mild thrombocytopenia: This has been chronic in nature it is very possible that she could be developing a myelodysplastic syndrome but we will continue to monitor at this time.  4. Herpes zoster: Her post herpetic neuralgia is improving.       [facility] MD 3/31/201510:07 AM

## 2013-09-20 ENCOUNTER — Encounter (HOSPITAL_COMMUNITY): Payer: Self-pay | Admitting: Emergency Medicine

## 2013-09-20 ENCOUNTER — Emergency Department (HOSPITAL_COMMUNITY)
Admission: EM | Admit: 2013-09-20 | Discharge: 2013-09-20 | Disposition: A | Discharge status: Home / Self Care | Payer: Medicare Other | Attending: Emergency Medicine | Admitting: Emergency Medicine

## 2013-09-20 DIAGNOSIS — M129 Arthropathy, unspecified: Secondary | ICD-10-CM | POA: Insufficient documentation

## 2013-09-20 DIAGNOSIS — B029 Zoster without complications: Secondary | ICD-10-CM | POA: Insufficient documentation

## 2013-09-20 DIAGNOSIS — I10 Essential (primary) hypertension: Secondary | ICD-10-CM | POA: Insufficient documentation

## 2013-09-20 DIAGNOSIS — J45909 Unspecified asthma, uncomplicated: Secondary | ICD-10-CM | POA: Insufficient documentation

## 2013-09-20 DIAGNOSIS — D649 Anemia, unspecified: Secondary | ICD-10-CM | POA: Insufficient documentation

## 2013-09-20 DIAGNOSIS — Z85038 Personal history of other malignant neoplasm of large intestine: Secondary | ICD-10-CM | POA: Insufficient documentation

## 2013-09-20 DIAGNOSIS — Z7982 Long term (current) use of aspirin: Secondary | ICD-10-CM | POA: Insufficient documentation

## 2013-09-20 DIAGNOSIS — Z86718 Personal history of other venous thrombosis and embolism: Secondary | ICD-10-CM | POA: Insufficient documentation

## 2013-09-20 DIAGNOSIS — Z792 Long term (current) use of antibiotics: Secondary | ICD-10-CM | POA: Insufficient documentation

## 2013-09-20 DIAGNOSIS — Z8505 Personal history of malignant neoplasm of liver: Secondary | ICD-10-CM | POA: Insufficient documentation

## 2013-09-20 DIAGNOSIS — G40909 Epilepsy, unspecified, not intractable, without status epilepticus: Secondary | ICD-10-CM | POA: Insufficient documentation

## 2013-09-20 DIAGNOSIS — Z8619 Personal history of other infectious and parasitic diseases: Secondary | ICD-10-CM | POA: Insufficient documentation

## 2013-09-20 DIAGNOSIS — Z79899 Other long term (current) drug therapy: Secondary | ICD-10-CM | POA: Insufficient documentation

## 2013-09-20 MED ORDER — LORAZEPAM 1 MG PO TABS
1.0000 mg | ORAL_TABLET | Freq: Once | ORAL | Status: AC
  Administered 2013-09-20: 1 mg via ORAL
  Filled 2013-09-20: qty 1

## 2013-09-20 MED ORDER — LORAZEPAM 1 MG PO TABS: 1.0000 mg | ORAL_TABLET | Freq: Three times a day (TID) | ORAL | Status: DC | PRN

## 2013-09-20 MED ORDER — HYDROMORPHONE HCL PF 1 MG/ML IJ SOLN
1.0000 mg | Freq: Once | INTRAMUSCULAR | Status: AC
  Administered 2013-09-20: 1 mg via INTRAMUSCULAR
  Filled 2013-09-20: qty 1

## 2013-09-20 MED ORDER — OXYCODONE-ACETAMINOPHEN 5-325 MG PO TABS: 1.0000 | ORAL_TABLET | ORAL | Status: DC | PRN

## 2013-09-20 NOTE — Discharge Instructions (Signed)
Neuropathic Pain We often think that pain has a physical cause. If we get rid of the cause, the pain should go away. Nerves themselves can also cause pain. It is called neuropathic pain, which means nerve abnormality. It may be difficult for the patients who have it and for the treating caregivers. Pain is usually described as acute (short-lived) or chronic (long-lasting). Acute pain is related to the physical sensations caused by an injury. It can last from a few seconds to many weeks, but it usually goes away when normal healing occurs. Chronic pain lasts beyond the typical healing time. With neuropathic pain, the nerve fibers themselves may be damaged or injured. They then send incorrect signals to other pain centers. The pain you feel is real, but the cause is not easy to find.  CAUSES  Chronic pain can result from diseases, such as diabetes and shingles (an infection related to chickenpox), or from trauma, surgery, or amputation. It can also happen without any known injury or disease. The nerves are sending pain messages, even though there is no identifiable cause for such messages.   Other common causes of neuropathy include diabetes, phantom limb pain, or Regional Pain Syndrome (RPS).  As with all forms of chronic back pain, if neuropathy is not correctly treated, there can be a number of associated problems that lead to a downward cycle for the patient. These include depression, sleeplessness, feelings of fear and anxiety, limited social interaction and inability to do normal daily activities or work.  The most dramatic and mysterious example of neuropathic pain is called "phantom limb syndrome." This occurs when an arm or a leg has been removed because of illness or injury. The brain still gets pain messages from the nerves that originally carried impulses from the missing limb. These nerves now seem to misfire and cause troubling pain.  Neuropathic pain often seems to have no cause. It responds  poorly to standard pain treatment. Neuropathic pain can occur after:  Shingles (herpes zoster virus infection).  A lasting burning sensation of the skin, caused usually by injury to a peripheral nerve.  Peripheral neuropathy which is widespread nerve damage, often caused by diabetes or alcoholism.  Phantom limb pain following an amputation.  Facial nerve problems (trigeminal neuralgia).  Multiple sclerosis.  Reflex sympathetic dystrophy.  Pain which comes with cancer and cancer chemotherapy.  Entrapment neuropathy such as when pressure is put on a nerve such as in carpal tunnel syndrome.  Back, leg, and hip problems (sciatica).  Spine or back surgery.  HIV Infection or AIDS where nerves are infected by viruses. Your caregiver can explain items in the above list which may apply to you. SYMPTOMS  Characteristics of neuropathic pain are:  Severe, sharp, electric shock-like, shooting, lightening-like, knife-like.  Pins and needles sensation.  Deep burning, deep cold, or deep ache.  Persistent numbness, tingling, or weakness.  Pain resulting from light touch or other stimulus that would not usually cause pain.  Increased sensitivity to something that would normally cause pain, such as a pinprick. Pain may persist for months or years following the healing of damaged tissues. When this happens, pain signals no longer sound an alarm about current injuries or injuries about to happen. Instead, the alarm system itself is not working correctly.  Neuropathic pain may get worse instead of better over time. For some people, it can lead to serious disability. It is important to be aware that severe injury in a limb can occur without a proper, protective pain  response.Burns, cuts, and other injuries may go unnoticed. Without proper treatment, these injuries can become infected or lead to further disability. Take any injury seriously, and consult your caregiver for treatment. DIAGNOSIS    When you have a pain with no known cause, your caregiver will probably ask some specific questions:   Do you have any other conditions, such as diabetes, shingles, multiple sclerosis, or HIV infection?  How would you describe your pain? (Neuropathic pain is often described as shooting, stabbing, burning, or searing.)  Is your pain worse at any time of the day? (Neuropathic pain is usually worse at night.)  Does the pain seem to follow a certain physical pathway?  Does the pain come from an area that has missing or injured nerves? (An example would be phantom limb pain.)  Is the pain triggered by minor things such as rubbing against the sheets at night? These questions often help define the type of pain involved. Once your caregiver knows what is happening, treatment can begin. Anticonvulsant, antidepressant drugs, and various pain relievers seem to work in some cases. If another condition, such as diabetes is involved, better management of that disorder may relieve the neuropathic pain.  TREATMENT  Neuropathic pain is frequently long-lasting and tends not to respond to treatment with narcotic type pain medication. It may respond well to other drugs such as antiseizure and antidepressant medications. Usually, neuropathic problems do not completely go away, but partial improvement is often possible with proper treatment. Your caregivers have large numbers of medications available to treat you. Do not be discouraged if you do not get immediate relief. Sometimes different medications or a combination of medications will be tried before you receive the results you are hoping for. See your caregiver if you have pain that seems to be coming from nowhere and does not go away. Help is available.  SEEK IMMEDIATE MEDICAL CARE IF:   There is a sudden change in the quality of your pain, especially if the change is on only one side of the body.  You notice changes of the skin, such as redness, black or  purple discoloration, swelling, or an ulcer.  You cannot move the affected limbs. Document Released: 12/18/2003 Document Revised: 06/14/2011 Document Reviewed: 12/18/2003 [facility] Patient Information 2015 [place], [place]. This information is not intended to replace advice given to you by your health care provider. Make sure you discuss any questions you have with your health care provider.  Shingles Shingles (herpes zoster) is an infection that is caused by the same virus that causes chickenpox (varicella). The infection causes a painful skin rash and fluid-filled blisters, which eventually break open, crust over, and heal. It may occur in any area of the body, but it usually affects only one side of the body or face. The pain of shingles usually lasts about 1 month. However, some people with shingles may develop long-term (chronic) pain in the affected area of the body. Shingles often occurs many years after the person had chickenpox. It is more common:  In people older than 50 years.  In people with weakened immune systems, such as those with HIV, AIDS, or cancer.  In people taking medicines that weaken the immune system, such as transplant medicines.  In people under great stress. CAUSES  Shingles is caused by the varicella zoster virus (VZV), which also causes chickenpox. After a person is infected with the virus, it can remain in the person's body for years in an inactive state (dormant). To cause shingles, the  virus reactivates and breaks out as an infection in a nerve root. The virus can be spread from person to person (contagious) through contact with open blisters of the shingles rash. It will only spread to people who have not had chickenpox. When these people are exposed to the virus, they may develop chickenpox. They will not develop shingles. Once the blisters scab over, the person is no longer contagious and cannot spread the virus to others. SYMPTOMS  Shingles shows up in stages.  The initial symptoms may be pain, itching, and tingling in an area of the skin. This pain is usually described as burning, stabbing, or throbbing.In a few days or weeks, a painful red rash will appear in the area where the pain, itching, and tingling were felt. The rash is usually on one side of the body in a band or belt-like pattern. Then, the rash usually turns into fluid-filled blisters. They will scab over and dry up in approximately 2-3 weeks. Flu-like symptoms may also occur with the initial symptoms, the rash, or the blisters. These may include:  Fever.  Chills.  Headache.  Upset stomach. DIAGNOSIS  Your caregiver will perform a skin exam to diagnose shingles. Skin scrapings or fluid samples may also be taken from the blisters. This sample will be examined under a microscope or sent to a lab for further testing. TREATMENT  There is no specific cure for shingles. Your caregiver will likely prescribe medicines to help you manage the pain, recover faster, and avoid long-term problems. This may include antiviral drugs, anti-inflammatory drugs, and pain medicines. HOME CARE INSTRUCTIONS   Take a cool bath or apply cool compresses to the area of the rash or blisters as directed. This may help with the pain and itching.   Only take over-the-counter or prescription medicines as directed by your caregiver.   Rest as directed by your caregiver.  Keep your rash and blisters clean with mild soap and cool water or as directed by your caregiver.  Do not pick your blisters or scratch your rash. Apply an anti-itch cream or numbing creams to the affected area as directed by your caregiver.  Keep your shingles rash covered with a loose bandage (dressing).  Avoid skin contact with:  Babies.   Pregnant women.   Children with eczema.   Elderly people with transplants.   People with chronic illnesses, such as leukemia or AIDS.   Wear loose-fitting clothing to help ease the pain of  material rubbing against the rash.  Keep all follow-up appointments with your caregiver.If the area involved is on your face, you may receive a referral for follow-up to a specialist, such as an eye doctor (ophthalmologist) or an ear, nose, and throat (ENT) doctor. Keeping all follow-up appointments will help you avoid eye complications, chronic pain, or disability.  SEEK IMMEDIATE MEDICAL CARE IF:   You have facial pain, pain around the eye area, or loss of feeling on one side of your face.  You have ear pain or ringing in your ear.  You have loss of taste.  Your pain is not relieved with prescribed medicines.   Your redness or swelling spreads.   You have more pain and swelling.  Your condition is worsening or has changed.   You have a feveror persistent symptoms for more than 2-3 days.  You have a fever and your symptoms suddenly get worse. MAKE SURE YOU:  Understand these instructions.  Will watch your condition.  Will get help right away if you  are not doing well or get worse. Document Released: 03/22/2005 Document Revised: 12/15/2011 Document Reviewed: 11/04/2011 [facility] Patient Information 2015 [place], [place]. This information is not intended to replace advice given to you by your health care provider. Make sure you discuss any questions you have with your health care provider.

## 2013-09-20 NOTE — ED Notes (Signed)
Pt c/o shingles x 2 wks.  Closed rash noted to back.  C/o low back pain.  Was given oxycodone by her PCP and took it all and was told that he would not give her any more until she came back to see him next month.

## 2013-09-21 ENCOUNTER — Emergency Department (HOSPITAL_COMMUNITY): Payer: Medicare Other

## 2013-09-21 ENCOUNTER — Emergency Department (HOSPITAL_COMMUNITY)
Admission: EM | Admit: 2013-09-21 | Discharge: 2013-09-21 | Disposition: A | Discharge status: Home / Self Care | Payer: Medicare Other | Attending: Emergency Medicine | Admitting: Emergency Medicine

## 2013-09-21 ENCOUNTER — Encounter (HOSPITAL_COMMUNITY): Payer: Self-pay | Admitting: Emergency Medicine

## 2013-09-21 DIAGNOSIS — Z862 Personal history of diseases of the blood and blood-forming organs and certain disorders involving the immune mechanism: Secondary | ICD-10-CM | POA: Insufficient documentation

## 2013-09-21 DIAGNOSIS — Z79899 Other long term (current) drug therapy: Secondary | ICD-10-CM | POA: Insufficient documentation

## 2013-09-21 DIAGNOSIS — Z85038 Personal history of other malignant neoplasm of large intestine: Secondary | ICD-10-CM | POA: Insufficient documentation

## 2013-09-21 DIAGNOSIS — Z86718 Personal history of other venous thrombosis and embolism: Secondary | ICD-10-CM | POA: Insufficient documentation

## 2013-09-21 DIAGNOSIS — Z8505 Personal history of malignant neoplasm of liver: Secondary | ICD-10-CM | POA: Insufficient documentation

## 2013-09-21 DIAGNOSIS — G40909 Epilepsy, unspecified, not intractable, without status epilepticus: Secondary | ICD-10-CM | POA: Insufficient documentation

## 2013-09-21 DIAGNOSIS — R269 Unspecified abnormalities of gait and mobility: Secondary | ICD-10-CM | POA: Insufficient documentation

## 2013-09-21 DIAGNOSIS — N3000 Acute cystitis without hematuria: Secondary | ICD-10-CM | POA: Insufficient documentation

## 2013-09-21 DIAGNOSIS — Z872 Personal history of diseases of the skin and subcutaneous tissue: Secondary | ICD-10-CM | POA: Insufficient documentation

## 2013-09-21 DIAGNOSIS — I10 Essential (primary) hypertension: Secondary | ICD-10-CM | POA: Insufficient documentation

## 2013-09-21 DIAGNOSIS — Z7982 Long term (current) use of aspirin: Secondary | ICD-10-CM | POA: Insufficient documentation

## 2013-09-21 DIAGNOSIS — M129 Arthropathy, unspecified: Secondary | ICD-10-CM | POA: Insufficient documentation

## 2013-09-21 DIAGNOSIS — J45909 Unspecified asthma, uncomplicated: Secondary | ICD-10-CM | POA: Insufficient documentation

## 2013-09-21 LAB — I-STAT CG4 LACTIC ACID, ED: Lactic Acid, Venous: 0.52 mmol/L (ref 0.5–2.2)

## 2013-09-21 LAB — URINALYSIS, ROUTINE W REFLEX MICROSCOPIC
Bilirubin Urine: NEGATIVE
GLUCOSE, UA: NEGATIVE mg/dL
Hgb urine dipstick: NEGATIVE
KETONES UR: NEGATIVE mg/dL
Nitrite: NEGATIVE
Protein, ur: NEGATIVE mg/dL
Specific Gravity, Urine: 1.011 (ref 1.005–1.030)
Urobilinogen, UA: 0.2 mg/dL (ref 0.0–1.0)
pH: 7 (ref 5.0–8.0)

## 2013-09-21 LAB — BASIC METABOLIC PANEL
BUN: 12 mg/dL (ref 6–23)
CO2: 25 mEq/L (ref 19–32)
Calcium: 8.8 mg/dL (ref 8.4–10.5)
Chloride: 97 mEq/L (ref 96–112)
Creatinine, Ser: 0.73 mg/dL (ref 0.50–1.10)
GFR calc Af Amer: >90 mL/min (ref >90)
GFR calc non Af Amer: 85 mL/min — ABNORMAL LOW (ref >90)
Glucose, Bld: 83 mg/dL (ref 70–99)
Potassium: 3.8 mEq/L (ref 3.7–5.3)
SODIUM: 135 meq/L — AB (ref 137–147)

## 2013-09-21 LAB — CBC
HEMATOCRIT: 33.3 % — AB (ref 36.0–46.0)
Hemoglobin: 11.5 g/dL — ABNORMAL LOW (ref 12.0–15.0)
MCH: 31.4 pg (ref 26.0–34.0)
MCHC: 34.5 g/dL (ref 30.0–36.0)
MCV: 91 fL (ref 78.0–100.0)
Platelets: 175 10*3/uL (ref 150–400)
RBC: 3.66 MIL/uL — ABNORMAL LOW (ref 3.87–5.11)
RDW: 12.6 % (ref 11.5–15.5)
WBC: 3 10*3/uL — ABNORMAL LOW (ref 4.0–10.5)

## 2013-09-21 LAB — I-STAT TROPONIN, ED: Troponin i, poc: 0.01 ng/mL (ref 0.00–0.08)

## 2013-09-21 LAB — URINE MICROSCOPIC-ADD ON

## 2013-09-21 LAB — CBG MONITORING, ED: Glucose-Capillary: 86 mg/dL (ref 70–99)

## 2013-09-21 MED ORDER — CEPHALEXIN 500 MG PO CAPS: 500.0000 mg | ORAL_CAPSULE | Freq: Four times a day (QID) | ORAL | Status: DC

## 2013-09-21 MED ORDER — DEXTROSE 5 % IV SOLN
1.0000 g | Freq: Once | INTRAVENOUS | Status: AC
  Administered 2013-09-21: 1 g via INTRAVENOUS
  Filled 2013-09-21: qty 10

## 2013-09-21 NOTE — ED Provider Notes (Signed)
CSN: [id number]     Arrival date & time 09/21/13  1606 History   First MD Initiated Contact with Patient 09/21/13 1621     Chief Complaint  Patient presents with  . Dizziness     (Consider location/radiation/quality/duration/timing/severity/associated sxs/prior Treatment) Patient is a 68 y.o. female presenting with dizziness. The history is provided by the patient.  Dizziness Quality:  Lightheadedness Severity:  Moderate Onset quality:  Sudden Timing:  Constant Progression:  Waxing and waning Chronicity:  New Context comment:  At rest Relieved by:  Nothing Worsened by:  Nothing tried Associated symptoms: no blood in stool, no chest pain, no headaches, no nausea, no shortness of breath, no vomiting and no weakness   Risk factors: no hx of stroke     Past Medical History  Diagnosis Date  . 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 Hypertension   . Colon cancer   . Asthma   . DVT (deep venous thrombosis)     right jugular  . Anemia   . Arthritis   . Clotting disorder   . Seizures   . PONV (postoperative nausea and vomiting)   . Spinal headache   . Shingles rash    Past Surgical History  Procedure Laterality Date  . Abdominal hysterectomy  2012  . Small intestine surgery    . Colonoscopy    . Portacath placement    . Colon surgery    . Radiofrequency ablation liver tumor     Family History  Problem Relation Age of Onset  . Hypertension Father   . Cancer Brother     colon  . Cancer Daughter     colon  . Breast cancer Sister    History  Substance Use Topics  . Smoking status: Never Smoker   . Smokeless tobacco: Never Used  . Alcohol Use: No   OB History   Grav Para Term Preterm Abortions TAB SAB Ect Mult Living                 Review of Systems  Constitutional: Negative for fever.  Respiratory: Negative for cough and shortness of breath.   Cardiovascular: Negative for chest pain.  Gastrointestinal: Negative for nausea, vomiting,  abdominal pain and blood in stool.  Neurological: Positive for dizziness. Negative for headaches.  All other systems reviewed and are negative.     Allergies  Review of patient's allergies indicates no known allergies.  Home Medications   Prior to Admission medications   Medication Sig Start Date End Date Taking? Authorizing Charisa Twitty  albuterol (PROVENTIL HFA;VENTOLIN HFA) 108 (90 BASE) MCG/ACT inhaler Inhale 2 puffs into the lungs every 6 (six) hours as needed for wheezing or shortness of breath.     Historical Yancy Knoble, MD  aspirin EC 81 MG tablet Take 81 mg by mouth daily.    Historical Gaylon Melchor, MD  Azilsartan-Chlorthalidone (EDARBYCLOR) 40-25 MG TABS Take 1 tablet by mouth daily.    Historical Cosima Prentiss, MD  cloNIDine (CATAPRES) 0.1 MG tablet Take 0.1 mg by mouth daily.     Historical Cidney Kirkwood, MD  fentaNYL (DURAGESIC - DOSED MCG/HR) 50 MCG/HR Place 1 patch (50 mcg total) onto the skin every 3 (three) days. 05/19/13   Sinda Du, MD  ferrous sulfate 325 (65 FE) MG tablet Take 325 mg by mouth 3 (three) times daily with meals.    Historical Javeon Macmurray, MD  Fluticasone-Salmeterol (ADVAIR) 100-50 MCG/DOSE AEPB Inhale 2 puffs into the lungs 2 (two) times  daily as needed (wheezing).     Historical Kendre Jacinto, MD  gabapentin (NEURONTIN) 400 MG capsule Take 400 mg by mouth 4 (four) times daily.    Historical Mutasim Tuckey, MD  labetalol (NORMODYNE) 200 MG tablet Take 200 mg by mouth 2 (two) times daily.    Historical Seila Liston, MD  levETIRAcetam (KEPPRA) 250 MG tablet Take 1 tablet (250 mg total) by mouth 2 (two) times daily. 06/12/13   Nilda Riggs, NP  lidocaine (LIDODERM) 5 % Place 1 patch onto the skin daily. Remove & Discard patch within 12 hours or as directed by MD 05/19/13   Jon Gills, MD  LORazepam (ATIVAN) 1 MG tablet Take 1 tablet (1 mg total) by mouth 3 (three) times daily as needed for anxiety. 09/20/13   Raeford Razor, MD  Multiple Vitamins-Minerals (CENTRUM SILVER ADULT 50+ PO) Take 1  tablet by mouth daily.    Historical Jaidyn Kuhl, MD  nebivolol (BYSTOLIC) 10 MG tablet Take 10 mg by mouth at bedtime. Take if  SBP > 145  .  Take at  Bedtime.    Historical Terianna Peggs, MD  oxyCODONE-acetaminophen (PERCOCET) 7.5-325 MG per tablet Take 1 tablet by mouth every 6 (six) hours as needed for pain.    Historical Rayane Gallardo, MD  oxyCODONE-acetaminophen (PERCOCET/ROXICET) 5-325 MG per tablet Take 1-2 tablets by mouth every 4 (four) hours as needed for severe pain. 09/20/13   Raeford Razor, MD  potassium chloride SA (K-DUR,KLOR-CON) 20 MEQ tablet Take 20 mEq by mouth 3 (three) times daily.     Historical Aracelia Brinson, MD   BP 123/79  Pulse 73  Temp(Src) 98.9 F (37.2 C) (Oral)  Resp 16  SpO2 98% Physical Exam  Nursing note and vitals reviewed. Constitutional: She is oriented to person, place, and time. She appears well-developed and well-nourished. No distress.  HENT:  Head: Normocephalic and atraumatic.  Eyes: EOM are normal. Pupils are equal, round, and reactive to light.  Neck: Normal range of motion. Neck supple.  Cardiovascular: Normal rate and regular rhythm.  Exam reveals no friction rub.   No murmur heard. Pulmonary/Chest: Effort normal and breath sounds normal. No respiratory distress. She has no wheezes. She has no rales.  Abdominal: Soft. She exhibits no distension. There is no tenderness. There is no rebound.  Musculoskeletal: Normal range of motion. She exhibits no edema.  Neurological: She is alert and oriented to person, place, and time. No cranial nerve deficit. She exhibits normal muscle tone. Gait (Ataxia, leaning to the right. No truncal ataxia noted) abnormal. Coordination normal. GCS eye subscore is 4. GCS verbal subscore is 5. GCS motor subscore is 6.  Skin: She is not diaphoretic.    ED Course  Procedures (including critical care time) Labs Review Labs Reviewed  CBC - Abnormal; Notable for the following:    WBC 3.0 (*)    RBC 3.66 (*)    Hemoglobin 11.5 (*)     HCT 33.3 (*)    All other components within normal limits  BASIC METABOLIC PANEL - Abnormal; Notable for the following:    Sodium 135 (*)    GFR calc non Af Amer 85 (*)    All other components within normal limits  URINALYSIS, ROUTINE W REFLEX MICROSCOPIC - Abnormal; Notable for the following:    Leukocytes, UA SMALL (*)    All other components within normal limits  URINE MICROSCOPIC-ADD ON  I-STAT TROPOININ, ED  CBG MONITORING, ED  I-STAT CG4 LACTIC ACID, ED    Imaging Review Dg Chest 2  View  09/21/2013   CLINICAL DATA:  DIZZINESS  EXAM: CHEST  2 VIEW  COMPARISON:  Chest CT dated 07/02/2013  FINDINGS: Low lung volumes. Cardiac silhouette moderately enlarged. There is mild prominence of interstitial markings. No focal regions of consolidation or focal infiltrates. Minimal blunting of the left costophrenic angle. Degenerative changes in the right and left shoulders.  IMPRESSION: Pulmonary vascular congestion without focal regions of consolidation or focal infiltrates. Scarring versus trace effusion left costophrenic angle. Advanced degenerative changes within the shoulders.   Electronically Signed   By: Margaree Mackintosh M.D.   On: 09/21/2013 19:30   Mr Brain Wo Contrast  09/21/2013   CLINICAL DATA:  Dizziness.  Metastatic colon cancer  EXAM: MRI HEAD WITHOUT CONTRAST  TECHNIQUE: Multiplanar, multiecho pulse sequences of the brain and surrounding structures were obtained without intravenous contrast.  COMPARISON:  CT head 12/23/2011  FINDINGS: Mild atrophy. Chronic microvascular ischemic changes throughout the cerebral white matter as well as in the pons and thalamus bilaterally.  Negative for acute infarct.  Negative for hemorrhage  Negative for mass or edema. No metastatic deposits are seen allowing for lack of intravenous contrast  Several small areas of chronic micro hemorrhage in the brain likely related to hypertension.  IMPRESSION: Atrophy and chronic microvascular ischemia no acute infarct.   Negative for mass lesion. If there is concern for metastatic disease, contrast-enhanced MRI suggested for further evaluation.   Electronically Signed   By: Franchot Gallo M.D.   On: 09/21/2013 18:25     EKG Interpretation   Date/Time:  Friday September 21 2013 18:09:27 EDT Ventricular Rate:  75 PR Interval:  154 QRS Duration: 82 QT Interval:  409 QTC Calculation: 457 R Axis:   61 Text Interpretation:  Sinus rhythm Borderline T wave abnormalities Similar  to prior Confirmed by Mingo Amber  MD, [place] ([id number]) on 09/21/2013 6:17:16 PM      MDM   Final diagnoses:  Acute cystitis without hematuria    19F here with dizziness. Began while lying in bed last night, no CP or SOB, no N/V/D, no fevers. No hx of stroke. Was seen here yesterday for back pain - hx of long standing back pain due to shingles, had run out of narcotics. Patient here today alert, answering questions, but with slow responses. Pupils pinpoint bilaterally. Concern for overuse of narcotic meds given for shingles pain. Cranial nerves intact, normal tone throughout, normal coordination. When walking, leaning to the right with some stutter steps. Her daughter and I stopped her from falling to the right. Will MRI, obtain labs, EKG. Outside of the window for Code Stroke. MRI normal. Labs show mild UTI - likely cause of dizziness. Ambulated without problem to the bathroom with the nursing tech. Rocephin given. Patient stable for discharge. Instructed to f/u with PCP. Given keflex. Instructed to hold of on her narcotics to avoid polypharmacy.  Osvaldo Shipper, MD 09/21/13 [phone]

## 2013-09-21 NOTE — Discharge Instructions (Signed)

## 2013-09-21 NOTE — ED Notes (Signed)
Patient with dizziness since last night.  Denies chest pain, sob.  Describes dizziness as a lightheadedness rather than the room spinning.  Alert and oriented.  Daughter at bedside.

## 2013-09-21 NOTE — ED Notes (Signed)
Ambulated pt. In the hallway, with steady gait , denied dizziness ,claimed of "feeling weak" but no pain.

## 2013-09-21 NOTE — ED Notes (Signed)
Pt. Ambulated to the bathroom and back to her room without difficulty. Pt. Gait steady on her feet. 

## 2013-09-21 NOTE — ED Notes (Signed)
Patient to MRI.

## 2013-09-21 NOTE — ED Notes (Signed)
Pt was seen yesterday for shingles in back, pt now c/o dizziness and blurred vision. today. Pt falling asleep in triage.

## 2013-09-25 NOTE — ED Provider Notes (Signed)
CSN: [id number]     Arrival date & time 09/20/13  9276 History   First MD Initiated Contact with Patient 09/20/13 0940     Chief Complaint  Patient presents with  . Herpes Zoster     (Consider location/radiation/quality/duration/timing/severity/associated sxs/prior Treatment) HPI  68yF with back/side pain. L side. Recently diagnosed with shingles ~2 weeks ago. Ran out of pain medication and very uncomfortable. Sharp/stabbing/achy. No appreciable exacerbating or relieving factors. No fever or chills. No n/v. No other acute complaints.   Past Medical History  Diagnosis Date  . 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 Hypertension   . Colon cancer   . Asthma   . DVT (deep venous thrombosis)     right jugular  . Anemia   . Arthritis   . Clotting disorder   . Seizures   . PONV (postoperative nausea and vomiting)   . Spinal headache   . Shingles rash    Past Surgical History  Procedure Laterality Date  . Abdominal hysterectomy  2012  . Small intestine surgery    . Colonoscopy    . Portacath placement    . Colon surgery    . Radiofrequency ablation liver tumor     Family History  Problem Relation Age of Onset  . Hypertension Father   . Cancer Brother     colon  . Cancer Daughter     colon  . Breast cancer Sister    History  Substance Use Topics  . Smoking status: Never Smoker   . Smokeless tobacco: Never Used  . Alcohol Use: No   OB History   Grav Para Term Preterm Abortions TAB SAB Ect Mult Living                 Review of Systems  All systems reviewed and negative, other than as noted in HPI.   Allergies  Review of patient's allergies indicates no known allergies.  Home Medications   Prior to Admission medications   Medication Sig Start Date End Date Taking? Authorizing Demere Dotzler  albuterol (PROVENTIL HFA;VENTOLIN HFA) 108 (90 BASE) MCG/ACT inhaler Inhale 2 puffs into the lungs every 6 (six) hours as needed for wheezing or  shortness of breath.    Yes Historical Monnie Anspach, MD  aspirin EC 81 MG tablet Take 81 mg by mouth daily.   Yes Historical Rykin Route, MD  Azilsartan-Chlorthalidone (EDARBYCLOR) 40-25 MG TABS Take 1 tablet by mouth daily.   Yes Historical Bentzion Dauria, MD  cloNIDine (CATAPRES) 0.1 MG tablet Take 0.1 mg by mouth daily.    Yes Historical Eeva Schlosser, MD  fentaNYL (DURAGESIC - DOSED MCG/HR) 50 MCG/HR Place 1 patch (50 mcg total) onto the skin every 3 (three) days. 05/19/13  Yes Sinda Du, MD  ferrous sulfate 325 (65 FE) MG tablet Take 325 mg by mouth 3 (three) times daily with meals.   Yes Historical Lynden Flemmer, MD  Fluticasone-Salmeterol (ADVAIR) 100-50 MCG/DOSE AEPB Inhale 2 puffs into the lungs 2 (two) times daily as needed (wheezing).    Yes Historical Dawnielle Christiana, MD  gabapentin (NEURONTIN) 400 MG capsule Take 400 mg by mouth 4 (four) times daily.   Yes Historical Quintez Maselli, MD  labetalol (NORMODYNE) 200 MG tablet Take 200 mg by mouth 2 (two) times daily.   Yes Historical Adlean Hardeman, MD  levETIRAcetam (KEPPRA) 250 MG tablet Take 1 tablet (250 mg total) by mouth 2 (two) times daily. 06/12/13  Yes Dennie Bible, NP  lidocaine (LIDODERM) 5 %  Place 1 patch onto the skin daily. Remove & Discard patch within 12 hours or as directed by MD 05/19/13  Yes Sinda Du, MD  Multiple Vitamins-Minerals (CENTRUM SILVER ADULT 50+ PO) Take 1 tablet by mouth daily.   Yes Historical Trentyn Boisclair, MD  nebivolol (BYSTOLIC) 10 MG tablet Take 10 mg by mouth at bedtime. Take if  SBP > 145  .  Take at  Bedtime.   Yes Historical June Vacha, MD  potassium chloride SA (K-DUR,KLOR-CON) 20 MEQ tablet Take 20 mEq by mouth 3 (three) times daily.    Yes Historical Reise Gladney, MD  cephALEXin (KEFLEX) 500 MG capsule Take 1 capsule (500 mg total) by mouth 4 (four) times daily. 09/21/13   Osvaldo Shipper, MD  LORazepam (ATIVAN) 1 MG tablet Take 1 tablet (1 mg total) by mouth 3 (three) times daily as needed for anxiety. 09/20/13   Virgel Manifold, MD   oxyCODONE-acetaminophen (PERCOCET/ROXICET) 5-325 MG per tablet Take 1-2 tablets by mouth every 4 (four) hours as needed for severe pain. 09/20/13   Virgel Manifold, MD   BP 163/83  Pulse 66  Temp(Src) 98.2 F (36.8 C) (Oral)  Resp 18  SpO2 96% Physical Exam  Nursing note and vitals reviewed. Constitutional: She appears well-developed and well-nourished. No distress.  HENT:  Head: Normocephalic and atraumatic.  Eyes: Conjunctivae are normal. Right eye exhibits no discharge. Left eye exhibits no discharge.  Neck: Neck supple.  Cardiovascular: Normal rate, regular rhythm and normal heart sounds.  Exam reveals no gallop and no friction rub.   No murmur heard. Pulmonary/Chest: Effort normal and breath sounds normal. No respiratory distress.  Abdominal: Soft. She exhibits no distension. There is no tenderness.  Musculoskeletal: She exhibits no edema and no tenderness.  Neurological: She is alert.  Skin: Skin is warm and dry.  Dermatomal rash to L ~T9 distribution consistent with shingles  Psychiatric: She has a normal mood and affect. Her behavior is normal. Thought content normal.    ED Course  Procedures (including critical care time) Labs Review Labs Reviewed - No data to display  Imaging Review No results found.   EKG Interpretation None      MDM   Final diagnoses:  Shingles    68yf with dermatomal rash consistent with shingles. Symptoms poorly controlled. Scripts provided. PCP FU.     Virgel Manifold, MD 09/25/13 [phone]

## 2013-10-26 ENCOUNTER — Ambulatory Visit: Payer: Medicare Other | Admitting: Family Medicine

## 2014-01-02 ENCOUNTER — Telehealth: Payer: Self-pay | Admitting: Oncology

## 2014-01-02 ENCOUNTER — Other Ambulatory Visit (HOSPITAL_BASED_OUTPATIENT_CLINIC_OR_DEPARTMENT_OTHER): Payer: Medicare Other

## 2014-01-02 ENCOUNTER — Encounter: Payer: Self-pay | Admitting: Oncology

## 2014-01-02 ENCOUNTER — Ambulatory Visit (HOSPITAL_BASED_OUTPATIENT_CLINIC_OR_DEPARTMENT_OTHER): Payer: Medicare Other | Admitting: Oncology

## 2014-01-02 VITALS — BP 128/56 | HR 61 | Temp 98.9°F | Resp 19 | Ht 63.0 in | Wt 170.5 lb

## 2014-01-02 DIAGNOSIS — Z86718 Personal history of other venous thrombosis and embolism: Secondary | ICD-10-CM

## 2014-01-02 DIAGNOSIS — Z8505 Personal history of malignant neoplasm of liver: Secondary | ICD-10-CM

## 2014-01-02 DIAGNOSIS — C189 Malignant neoplasm of colon, unspecified: Secondary | ICD-10-CM

## 2014-01-02 DIAGNOSIS — Z85038 Personal history of other malignant neoplasm of large intestine: Secondary | ICD-10-CM

## 2014-01-02 DIAGNOSIS — D696 Thrombocytopenia, unspecified: Secondary | ICD-10-CM

## 2014-01-02 DIAGNOSIS — D72819 Decreased white blood cell count, unspecified: Secondary | ICD-10-CM

## 2014-01-02 LAB — CBC WITH DIFFERENTIAL/PLATELET
BASO%: 1.4 % (ref 0.0–2.0)
Basophils Absolute: 0 10*3/uL (ref 0.0–0.1)
EOS ABS: 0 10*3/uL (ref 0.0–0.5)
EOS%: 0.8 % (ref 0.0–7.0)
HCT: 34.7 % — ABNORMAL LOW (ref 34.8–46.6)
HGB: 11.4 g/dL — ABNORMAL LOW (ref 11.6–15.9)
LYMPH%: 27.3 % (ref 14.0–49.7)
MCH: 31.5 pg (ref 25.1–34.0)
MCHC: 32.9 g/dL (ref 31.5–36.0)
MCV: 95.8 fL (ref 79.5–101.0)
MONO#: 0.3 10*3/uL (ref 0.1–0.9)
MONO%: 12.7 % (ref 0.0–14.0)
NEUT%: 57.8 % (ref 38.4–76.8)
NEUTROS ABS: 1.5 10*3/uL (ref 1.5–6.5)
PLATELETS: 189 10*3/uL (ref 145–400)
RBC: 3.63 10*6/uL — ABNORMAL LOW (ref 3.70–5.45)
RDW: 13.7 % (ref 11.2–14.5)
WBC: 2.5 10*3/uL — AB (ref 3.9–10.3)
lymph#: 0.7 10*3/uL — ABNORMAL LOW (ref 0.9–3.3)

## 2014-01-02 LAB — COMPREHENSIVE METABOLIC PANEL (CC13)
ALT: 14 U/L (ref 0–55)
ANION GAP: 5 meq/L (ref 3–11)
AST: 21 U/L (ref 5–34)
Albumin: 3.2 g/dL — ABNORMAL LOW (ref 3.5–5.0)
Alkaline Phosphatase: 60 U/L (ref 40–150)
BUN: 21.6 mg/dL (ref 7.0–26.0)
CALCIUM: 9.1 mg/dL (ref 8.4–10.4)
CHLORIDE: 105 meq/L (ref 98–109)
CO2: 30 meq/L — AB (ref 22–29)
CREATININE: 1 mg/dL (ref 0.6–1.1)
Glucose: 99 mg/dl (ref 70–140)
Potassium: 3.9 mEq/L (ref 3.5–5.1)
Sodium: 140 mEq/L (ref 136–145)
Total Bilirubin: 0.56 mg/dL (ref 0.20–1.20)
Total Protein: 7 g/dL (ref 6.4–8.3)

## 2014-01-02 LAB — CEA: CEA: <0.5 ng/mL (ref 0.0–5.0)

## 2014-01-02 NOTE — Progress Notes (Signed)
Mailed updated medication list to patient's home 

## 2014-01-02 NOTE — Progress Notes (Signed)
Hematology and Oncology Follow Up Visit  PHILIP ECKERSLEY [id number] 1945/08/30 68 y.o. 01/02/2014 8:58 AM   Principle Diagnosis: 68 year old woman with stage IV colon cancer with a history of an isolated metastasis to the liver diagnosed in 04/2009 She is currently NED.   Prior Therapy:  She is status post colectomy as well as radiofrequency ablation to an isolated liver met on 05/05/2009 at [facility].  She is status post chemotherapy with FOLFOX 6 and Avastin from 06/08/2009 through 12/19/2009 requiring dose reduction for thrombocytopenia and leukopenia. CT scan findings were retroperitoneal and anterior adenopathy status post CT guided core biopsy on 03/15/2011 showed no evidence  of malignancy except for fibrosis. 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   Current therapy: Observation and follow up.   Interim History: Ms. Gallogly presents today for a follow up visit. Since her last visit she has been doing well without any new complications. She was diagnosed with shingles and completed the course of treatment but still has symptoms of postherpetic neuralgia. She is not reporting any new seizures at this time and continue to be on seizure medication. She report no GI symptoms, no bleeding or weight loss. Her energy levels stable. She has no nausea, vomiting or abdominal pain. She has had no changes of bowel function. She is still very functional and lives independently. She was able to drive and continue to do so as well as attend to her activity of daily living. She has not reported any bleeding problems nor any other constitutional symptoms. She does not report any headaches or blurry vision or syncope. She has not reported any chest pain or palpitations. She does not report any skeletal complaints. She is not on any lymphadenopathy or petechiae. She does not report any mood issues. Rest of her review of systems  unremarkable.   Medications: I have reviewed the patient's current medications. Current Outpatient Prescriptions  Medication Sig Dispense Refill  . albuterol (PROVENTIL HFA;VENTOLIN HFA) 108 (90 BASE) MCG/ACT inhaler Inhale 2 puffs into the lungs every 6 (six) hours as needed for wheezing or shortness of breath.       Marland Kitchen aspirin EC 81 MG tablet Take 81 mg by mouth daily.      . Azilsartan-Chlorthalidone (EDARBYCLOR) 40-25 MG TABS Take 1 tablet by mouth daily.      . cephALEXin (KEFLEX) 500 MG capsule Take 1 capsule (500 mg total) by mouth 4 (four) times daily.  28 capsule  0  . cloNIDine (CATAPRES) 0.1 MG tablet Take 0.1 mg by mouth daily.       . fentaNYL (DURAGESIC - DOSED MCG/HR) 50 MCG/HR Place 1 patch (50 mcg total) onto the skin every 3 (three) days.  5 patch  0  . ferrous sulfate 325 (65 FE) MG tablet Take 325 mg by mouth 3 (three) times daily with meals.      . Fluticasone-Salmeterol (ADVAIR) 100-50 MCG/DOSE AEPB Inhale 2 puffs into the lungs 2 (two) times daily as needed (wheezing).       . gabapentin (NEURONTIN) 400 MG capsule Take 400 mg by mouth 4 (four) times daily.      Marland Kitchen labetalol (NORMODYNE) 200 MG tablet Take 200 mg by mouth 2 (two) times daily.      Marland Kitchen levETIRAcetam (KEPPRA) 250 MG tablet Take 1 tablet (250 mg total) by mouth 2 (two) times daily.  60 tablet  11  . lidocaine (LIDODERM) 5 % Place 1 patch onto the skin daily.  Remove & Discard patch within 12 hours or as directed by MD  30 patch  0  . LORazepam (ATIVAN) 1 MG tablet Take 1 tablet (1 mg total) by mouth 3 (three) times daily as needed for anxiety.  15 tablet  0  . Multiple Vitamins-Minerals (CENTRUM SILVER ADULT 50+ PO) Take 1 tablet by mouth daily.      . nebivolol (BYSTOLIC) 10 MG tablet Take 10 mg by mouth at bedtime. Take if  SBP > 145  .  Take at  Bedtime.      Marland Kitchen oxyCODONE-acetaminophen (PERCOCET/ROXICET) 5-325 MG per tablet Take 1-2 tablets by mouth every 4 (four) hours as needed for severe pain.  20 tablet  0  .  potassium chloride SA (K-DUR,KLOR-CON) 20 MEQ tablet Take 20 mEq by mouth 3 (three) times daily.        No current facility-administered medications for this visit.    Allergies: No Known Allergies  Past Medical History, Surgical history, Social history, and Family History were reviewed and updated.   Physical Exam: Blood pressure 128/56, pulse 61, temperature 98.9 F (37.2 C), temperature source Oral, resp. rate 19, height '5\' 3"'  (1.6 m), weight 170 lb 8 oz (77.338 kg), SpO2 100.00%. ECOG: 1 General appearance: alert is not in any distress. Head: Normocephalic, without obvious abnormality, atraumatic Neck: no adenopathy Lymph nodes: Cervical, supraclavicular, and axillary nodes normal. Heart:regular rate and rhythm, S1, S2 normal, no murmur, click, rub or gallop Lung:chest clear, no wheezing, rales, normal symmetric air entry Abdomin: soft, non-tender, without masses or organomegaly EXT:no erythema, induration, or nodules   Lab Results: Lab Results  Component Value Date   WBC 2.5* 01/02/2014   HGB 11.4* 01/02/2014   HCT 34.7* 01/02/2014   MCV 95.8 01/02/2014   PLT 189 01/02/2014     Chemistry      Component Value Date/Time   NA 135* 09/21/2013 1655   NA 138 07/02/2013 0959   NA 142 06/10/2011 1152   K 3.8 09/21/2013 1655   K 4.1 07/02/2013 0959   K 4.4 06/10/2011 1152   CL 97 09/21/2013 1655   CL 106 06/19/2012 0936   CL 96* 06/10/2011 1152   CO2 25 09/21/2013 1655   CO2 25 07/02/2013 0959   CO2 29 06/10/2011 1152   BUN 12 09/21/2013 1655   BUN 9.0 07/02/2013 0959   BUN 12 06/10/2011 1152   CREATININE 0.73 09/21/2013 1655   CREATININE 0.7 07/02/2013 0959   CREATININE 0.7 06/10/2011 1152      Component Value Date/Time   CALCIUM 8.8 09/21/2013 1655   CALCIUM 8.7 07/02/2013 0959   CALCIUM 8.8 06/10/2011 1152   ALKPHOS 48 07/02/2013 0959   ALKPHOS 55 03/26/2013 1200   ALKPHOS 53 06/10/2011 1152   AST 14 07/02/2013 0959   AST 24 03/26/2013 1200   AST 23 06/10/2011 1152   ALT 6 07/02/2013 0959    ALT 12 03/26/2013 1200   ALT 21 06/10/2011 1152   BILITOT 0.47 07/02/2013 0959   BILITOT 0.5 03/26/2013 1200   BILITOT 0.50 06/10/2011 1152      Current Outpatient Prescriptions  Medication Sig Dispense Refill  . albuterol (PROVENTIL HFA;VENTOLIN HFA) 108 (90 BASE) MCG/ACT inhaler Inhale 2 puffs into the lungs every 6 (six) hours as needed for wheezing or shortness of breath.       Marland Kitchen aspirin EC 81 MG tablet Take 81 mg by mouth daily.      . Azilsartan-Chlorthalidone (EDARBYCLOR) 40-25 MG TABS Take 1  tablet by mouth daily.      . cephALEXin (KEFLEX) 500 MG capsule Take 1 capsule (500 mg total) by mouth 4 (four) times daily.  28 capsule  0  . cloNIDine (CATAPRES) 0.1 MG tablet Take 0.1 mg by mouth daily.       . fentaNYL (DURAGESIC - DOSED MCG/HR) 50 MCG/HR Place 1 patch (50 mcg total) onto the skin every 3 (three) days.  5 patch  0  . ferrous sulfate 325 (65 FE) MG tablet Take 325 mg by mouth 3 (three) times daily with meals.      . Fluticasone-Salmeterol (ADVAIR) 100-50 MCG/DOSE AEPB Inhale 2 puffs into the lungs 2 (two) times daily as needed (wheezing).       . gabapentin (NEURONTIN) 400 MG capsule Take 400 mg by mouth 4 (four) times daily.      Marland Kitchen labetalol (NORMODYNE) 200 MG tablet Take 200 mg by mouth 2 (two) times daily.      Marland Kitchen levETIRAcetam (KEPPRA) 250 MG tablet Take 1 tablet (250 mg total) by mouth 2 (two) times daily.  60 tablet  11  . lidocaine (LIDODERM) 5 % Place 1 patch onto the skin daily. Remove & Discard patch within 12 hours or as directed by MD  30 patch  0  . LORazepam (ATIVAN) 1 MG tablet Take 1 tablet (1 mg total) by mouth 3 (three) times daily as needed for anxiety.  15 tablet  0  . Multiple Vitamins-Minerals (CENTRUM SILVER ADULT 50+ PO) Take 1 tablet by mouth daily.      . nebivolol (BYSTOLIC) 10 MG tablet Take 10 mg by mouth at bedtime. Take if  SBP > 145  .  Take at  Bedtime.      Marland Kitchen oxyCODONE-acetaminophen (PERCOCET/ROXICET) 5-325 MG per tablet Take 1-2 tablets by mouth  every 4 (four) hours as needed for severe pain.  20 tablet  0  . potassium chloride SA (K-DUR,KLOR-CON) 20 MEQ tablet Take 20 mEq by mouth 3 (three) times daily.        No current facility-administered medications for this visit.     Impression and Plan: 68 year old woman with:  1. Stage IV colon cancer with a history of an isolated metastasis to the liver. Status post colectomy as well as radiofrequency ablation to an isolated liver met on 05/05/2009 at [facility]. Status post chemotherapy with FOLFOX 6 and Avastin from 06/08/2009 through 12/19/2009. Her CT scan from 07/02/2013  continued to show no evidence of disease. The plan is to continue active surveillance and repeat imaging studies and laboratory testing in 6 months.   2. 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 Her port has been removed on 06/2011.   3. Leukocytopenia and a mild thrombocytopenia: This has been chronic in nature and have not dramatically changed. We'll continue to monitor.  4. Herpes zoster: Her post herpetic neuralgia seems to be manageable at this time.  5. Followup: Will be in 6 months.       [facility], MD 9/30/20158:58 AM

## 2014-01-02 NOTE — Telephone Encounter (Signed)
gv adn printed appt sched and avs for pt for March 2016....gv pt barium

## 2014-01-02 NOTE — Addendum Note (Signed)
Addended by: Randolm Idol on: 01/02/2014 09:11 AM   Modules accepted: Orders, Medications

## 2014-02-20 ENCOUNTER — Encounter: Payer: Self-pay | Admitting: Neurology

## 2014-02-26 ENCOUNTER — Encounter: Payer: Self-pay | Admitting: Neurology

## 2014-03-21 ENCOUNTER — Other Ambulatory Visit: Payer: Self-pay | Admitting: Nurse Practitioner

## 2014-06-13 ENCOUNTER — Encounter: Payer: Self-pay | Admitting: Neurology

## 2014-06-13 ENCOUNTER — Ambulatory Visit (INDEPENDENT_AMBULATORY_CARE_PROVIDER_SITE_OTHER): Payer: Medicare Other | Admitting: Neurology

## 2014-06-13 VITALS — BP 123/65 | HR 63 | Ht 63.0 in | Wt 167.0 lb

## 2014-06-13 DIAGNOSIS — R569 Unspecified convulsions: Secondary | ICD-10-CM

## 2014-06-13 NOTE — Progress Notes (Signed)
[facility]  PATIENT: [name] DOB: Sep 12, 1945   HISTORY OF PRESENT ILLNESS:  [name], is a 69 years old right-handed female, follow-up for seizure, she only has one seizure in February second 2012   She has past medical history of hypertension, colon cancer in 2011, status post surgery, chemotherapy, she also underwent hysterectomy in February 2012 for uterus prolapse, she presented with seizure in May 27, 2010. She was discharged from nursing home after her hysterectomy due to the needs for prolonged antibiotic infusion,she is not sure the name of the medications.  in May 27, 2010 while at nursing home, walking in her room in the early afternoon, without warning signs, she had one seizure, was taken to the emergency room. She had no recollection of what has happened, wake up in the emergency room confused. CAT scan without contrast of brain was normal. Laboratory evaluation has demonstrate decreased white count 2.6, anemia hemoglobin 9.3, hematocrit 29.7, UA showed UTI, moderate esterase, she apparently was started on Keppra 250 mg b.i.d. 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 EEG normal.   She has no recurrent seizure, has stopped Keppra since 2015, no recurrent seizure, intermittent left thoracic pain due to post herpetic neuralgia, from shingles, no gait difficulty, lives alone, still driving,  REVIEW OF SYSTEMS: Full 14 system review of systems performed and notable only for those listed, all others are neg: Restless leg, insomnia,    ALLERGIES: No Known Allergies  HOME MEDICATIONS: Outpatient Prescriptions Prior to  Visit  Medication Sig Dispense Refill  . albuterol (PROVENTIL HFA;VENTOLIN HFA) 108 (90 BASE) MCG/ACT inhaler Inhale 2 puffs into the lungs every 6 (six) hours as needed for wheezing or shortness of breath.     Marland Kitchen aspirin EC 81 MG tablet Take 81 mg by mouth daily.    . Azilsartan-Chlorthalidone (EDARBYCLOR) 40-25 MG TABS Take 1 tablet by mouth daily.    . cloNIDine (CATAPRES) 0.1 MG tablet Take 0.1 mg by mouth daily.     Marland Kitchen diltiazem (CARDIZEM CD) 180 MG 24 hr capsule Take 180 mg by mouth daily.    . fentaNYL (DURAGESIC - DOSED MCG/HR) 50 MCG/HR Place 1 patch (50 mcg total) onto the skin every 3 (three) days. 5 patch 0  . ferrous sulfate 325 (65 FE) MG tablet Take 325 mg by mouth 3 (three) times daily with meals.    . Fluticasone-Salmeterol (ADVAIR) 100-50 MCG/DOSE AEPB Inhale 2 puffs into the lungs 2 (two) times daily as needed (wheezing).     . furosemide (LASIX) 40 MG tablet Take 40 mg by mouth daily.    Marland Kitchen gabapentin (NEURONTIN) 400 MG capsule Take 400 mg by mouth 4 (four) times daily.    Marland Kitchen lidocaine (LIDODERM) 5 % Place 1 patch onto the skin daily. Remove & Discard patch within 12 hours or as directed by MD 30 patch 0  . Multiple Vitamins-Minerals (CENTRUM SILVER ADULT 50+ PO) Take 1 tablet by mouth daily.    . nebivolol (BYSTOLIC) 10 MG tablet Take 10 mg by mouth at bedtime. Take if  SBP > 145  .  Take at  Bedtime.    . potassium chloride SA (K-DUR,KLOR-CON) 20 MEQ tablet Take 20 mEq by  mouth 3 (three) times daily.     Marland Kitchen spironolactone (ALDACTONE) 25 MG tablet Take 25 mg by mouth daily.     No facility-administered medications prior to visit.    PAST MEDICAL HISTORY: Past Medical History  Diagnosis Date  . 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 Hypertension   . Colon cancer   . Asthma   . DVT (deep venous thrombosis)     right jugular  . Anemia   . Arthritis   . Clotting disorder   . Seizures   . PONV (postoperative nausea and vomiting)   . Spinal headache    . Shingles rash     PAST SURGICAL HISTORY: Past Surgical History  Procedure Laterality Date  . Abdominal hysterectomy  2012  . Small intestine surgery    . Colonoscopy    . Portacath placement    . Colon surgery    . Radiofrequency ablation liver tumor      FAMILY HISTORY: Family History  Problem Relation Age of Onset  . Hypertension Father   . Cancer Brother     colon  . Cancer Daughter     colon  . Breast cancer Sister     SOCIAL HISTORY: History   Social History  . Marital Status: Divorced    Spouse Name: N/A  . Number of Children: 3  . Years of Education: 12+   Occupational History  . Retired    Social History Main Topics  . Smoking status: Never Smoker   . Smokeless tobacco: Never Used  . Alcohol Use: No  . Drug Use: No  . Sexual Activity: No   Other Topics Concern  . Not on file   Social History Narrative   Patient lives at home alone.    Patient is divorced.    Patient has 3 children.    Patient has some college.    Patient is retired.      PHYSICAL EXAM  Filed Vitals:   06/13/14 1200  BP: 123/65  Pulse: 63  Height: 5' 3" (1.6 m)  Weight: 167 lb (75.751 kg)   Body mass index is 29.59 kg/(m^2).   PHYSICAL EXAMNIATION:  Gen: NAD, conversant, well nourised, obese, well groomed                     Cardiovascular: Regular rate rhythm, no peripheral edema, warm, nontender. Eyes: Conjunctivae clear without exudates or hemorrhage Neck: Supple, no carotid bruise. Pulmonary: Clear to auscultation bilaterally   NEUROLOGICAL EXAM:  MENTAL STATUS: Speech:    Speech is normal; fluent and spontaneous with normal comprehension.  Cognition:    The patient is oriented to person, place, and time;     recent and remote memory intact;     language fluent;     normal attention, concentration,     fund of knowledge.  CRANIAL NERVES: CN II: Visual fields are full to confrontation. Fundoscopic exam is normal with sharp discs and no vascular  changes. Venous pulsations are present bilaterally. Pupils are 4 mm and briskly reactive to light. Visual acuity is 20/20 bilaterally. CN III, IV, VI: extraocular movement are normal. No ptosis. CN V: Facial sensation is intact to pinprick in all 3 divisions bilaterally. Corneal responses are intact.  CN VII: Face is symmetric with normal eye closure and smile. CN VIII: Hearing is normal to rubbing fingers CN IX, X: Palate elevates symmetrically. Phonation is normal. CN XI: Head turning and  shoulder shrug are intact CN XII: Tongue is midline with normal movements and no atrophy.  MOTOR: There is no pronator drift of out-stretched arms. Muscle bulk and tone are normal. Muscle strength is normal.   Shoulder abduction Shoulder external rotation Elbow flexion Elbow extension Wrist flexion Wrist extension Finger abduction Hip flexion Knee flexion Knee extension Ankle dorsi flexion Ankle plantar flexion  R _0 L _1 REFLEXES: Reflexes are 2+ and symmetric at the biceps, triceps, knees, and ankles. Plantar responses are flexor.  SENSORY: Light touch, pinprick, position sense, and vibration sense are intact in fingers and toes.  COORDINATION: Rapid alternating movements and fine finger movements are intact. There is no dysmetria on finger-to-nose and heel-knee-shin. There are no abnormal or extraneous movements.   GAIT/STANCE: Posture is normal. Gait is steady with normal steps, base, arm swing, and turning. Heel and toe walking are normal. Tandem gait is normal.  Romberg is absent.   DIAGNOSTIC DATA (LABS, IMAGING, TESTING) - I reviewed patient records, labs, notes, testing and imaging myself where available.  Lab Results  Component Value Date   WBC 2.5* 01/02/2014   HGB 11.4* 01/02/2014   HCT 34.7* 01/02/2014   MCV 95.8 01/02/2014   PLT 189 01/02/2014      Component Value Date/Time   NA 140 01/02/2014 0833   NA 135* 09/21/2013 1655    NA 142 06/10/2011 1152   K 3.9 01/02/2014 0833   K 3.8 09/21/2013 1655   K 4.4 06/10/2011 1152   CL 97 09/21/2013 1655   CL 106 06/19/2012 0936   CL 96* 06/10/2011 1152   CO2 30* 01/02/2014 0833   CO2 25 09/21/2013 1655   CO2 29 06/10/2011 1152   GLUCOSE 99 01/02/2014 0833   GLUCOSE 83 09/21/2013 1655   GLUCOSE 93 06/19/2012 0936   GLUCOSE 88 06/10/2011 1152   BUN 21.6 01/02/2014 0833   BUN 12 09/21/2013 1655   BUN 12 06/10/2011 1152   CREATININE 1.0 01/02/2014 0833   CREATININE 0.73 09/21/2013 1655   CREATININE 0.7 06/10/2011 1152   CALCIUM 9.1 01/02/2014 0833   CALCIUM 8.8 09/21/2013 1655   CALCIUM 8.8 06/10/2011 1152   PROT 7.0 01/02/2014 0833   PROT 7.7 03/26/2013 1200   PROT 7.5 06/10/2011 1152   ALBUMIN 3.2* 01/02/2014 0833   ALBUMIN 3.7 03/26/2013 1200   AST 21 01/02/2014 0833   AST 24 03/26/2013 1200   AST 23 06/10/2011 1152   ALT 14 01/02/2014 0833   ALT 12 03/26/2013 1200   ALT 21 06/10/2011 1152   ALKPHOS 60 01/02/2014 0833   ALKPHOS 55 03/26/2013 1200   ALKPHOS 53 06/10/2011 1152   BILITOT 0.56 01/02/2014 0833   BILITOT 0.5 03/26/2013 1200   BILITOT 0.50 06/10/2011 1152   GFRNONAA 85* 09/21/2013 1655   GFRAA >90 09/21/2013 1655    ASSESSMENT AND PLAN  69 y.o. year old female  has a past medical history of metastatic colon cancer to liver (dx'd 02/25/09); presenting with 1 episode of seizure in May 27, 2010. Currently on Keppra 250 twice daily without further events. Normal neurologic exam. MRI of the brain 11/19/10 shows non specific  subcortical and periventricular white matter hypertensities likely from chronic microvascular disease. EEG normal.   No recurrent seizure, she has stopped seizure since 2015,  Metastatic liver cancer, status post radioablation therapy, yearly  follow-up with oncology, Call clinic for new issues  Marcial Pacas, M.D. Ph.D.  [facility] [place], [place] [address] Phone: [phone] Fax:       [phone]

## 2014-07-02 ENCOUNTER — Other Ambulatory Visit (HOSPITAL_BASED_OUTPATIENT_CLINIC_OR_DEPARTMENT_OTHER): Payer: Medicare Other

## 2014-07-02 ENCOUNTER — Encounter (HOSPITAL_COMMUNITY): Payer: Self-pay

## 2014-07-02 ENCOUNTER — Ambulatory Visit (HOSPITAL_COMMUNITY)
Admission: RE | Admit: 2014-07-02 | Discharge: 2014-07-02 | Disposition: A | Discharge status: Home / Self Care | Payer: Medicare Other | Source: Ambulatory Visit | Attending: Oncology | Admitting: Oncology

## 2014-07-02 DIAGNOSIS — Z85038 Personal history of other malignant neoplasm of large intestine: Secondary | ICD-10-CM | POA: Diagnosis not present

## 2014-07-02 DIAGNOSIS — C189 Malignant neoplasm of colon, unspecified: Secondary | ICD-10-CM | POA: Diagnosis present

## 2014-07-02 LAB — CBC WITH DIFFERENTIAL/PLATELET
BASO%: 0.7 % (ref 0.0–2.0)
Basophils Absolute: 0 10*3/uL (ref 0.0–0.1)
EOS%: 1.1 % (ref 0.0–7.0)
Eosinophils Absolute: 0 10*3/uL (ref 0.0–0.5)
HCT: 34.9 % (ref 34.8–46.6)
HGB: 11.7 g/dL (ref 11.6–15.9)
LYMPH%: 28.3 % (ref 14.0–49.7)
MCH: 31.8 pg (ref 25.1–34.0)
MCHC: 33.5 g/dL (ref 31.5–36.0)
MCV: 94.8 fL (ref 79.5–101.0)
MONO#: 0.3 10*3/uL (ref 0.1–0.9)
MONO%: 12.6 % (ref 0.0–14.0)
NEUT#: 1.5 10*3/uL (ref 1.5–6.5)
NEUT%: 57.3 % (ref 38.4–76.8)
PLATELETS: 146 10*3/uL (ref 145–400)
RBC: 3.68 10*6/uL — AB (ref 3.70–5.45)
RDW: 12.4 % (ref 11.2–14.5)
WBC: 2.7 10*3/uL — ABNORMAL LOW (ref 3.9–10.3)
lymph#: 0.8 10*3/uL — ABNORMAL LOW (ref 0.9–3.3)

## 2014-07-02 LAB — COMPREHENSIVE METABOLIC PANEL (CC13)
ALT: 12 U/L (ref 0–55)
ANION GAP: 9 meq/L (ref 3–11)
AST: 19 U/L (ref 5–34)
Albumin: 3.1 g/dL — ABNORMAL LOW (ref 3.5–5.0)
Alkaline Phosphatase: 49 U/L (ref 40–150)
BUN: 17.4 mg/dL (ref 7.0–26.0)
CO2: 28 mEq/L (ref 22–29)
Calcium: 8.6 mg/dL (ref 8.4–10.4)
Chloride: 104 mEq/L (ref 98–109)
Creatinine: 0.9 mg/dL (ref 0.6–1.1)
EGFR: 71 mL/min/{1.73_m2} — AB (ref >90)
GLUCOSE: 92 mg/dL (ref 70–140)
POTASSIUM: 3.5 meq/L (ref 3.5–5.1)
Sodium: 141 mEq/L (ref 136–145)
Total Bilirubin: 0.43 mg/dL (ref 0.20–1.20)
Total Protein: 6.6 g/dL (ref 6.4–8.3)

## 2014-07-02 LAB — CEA

## 2014-07-02 MED ORDER — IOHEXOL 300 MG/ML  SOLN
100.0000 mL | Freq: Once | INTRAMUSCULAR | Status: AC | PRN
  Administered 2014-07-02: 100 mL via INTRAVENOUS

## 2014-07-04 ENCOUNTER — Telehealth: Payer: Self-pay | Admitting: Oncology

## 2014-07-04 ENCOUNTER — Ambulatory Visit (HOSPITAL_BASED_OUTPATIENT_CLINIC_OR_DEPARTMENT_OTHER): Payer: Medicare Other | Admitting: Oncology

## 2014-07-04 VITALS — BP 127/60 | HR 56 | Temp 98.1°F | Resp 17 | Ht 63.0 in | Wt 179.1 lb

## 2014-07-04 DIAGNOSIS — D72819 Decreased white blood cell count, unspecified: Secondary | ICD-10-CM | POA: Diagnosis not present

## 2014-07-04 DIAGNOSIS — Z86718 Personal history of other venous thrombosis and embolism: Secondary | ICD-10-CM | POA: Diagnosis not present

## 2014-07-04 DIAGNOSIS — C189 Malignant neoplasm of colon, unspecified: Secondary | ICD-10-CM

## 2014-07-04 DIAGNOSIS — Z85038 Personal history of other malignant neoplasm of large intestine: Secondary | ICD-10-CM

## 2014-07-04 NOTE — Telephone Encounter (Signed)
Pt confirmed labs/ov per 03/31 POF, gave pt AVS and Calendar.... KJ  °

## 2014-07-04 NOTE — Progress Notes (Signed)
Hematology and Oncology Follow Up Visit  [name] [id number] 1945/12/05 69 y.o. 07/04/2014 9:50 AM   Principle Diagnosis: 69 year old woman with stage IV colon cancer with a history of an isolated metastasis to the liver diagnosed in 04/2009 She is currently NED.   Prior Therapy:  She is status post colectomy as well as radiofrequency ablation to an isolated liver met on 05/05/2009 at [facility].  She is status post chemotherapy with FOLFOX 6 and Avastin from 06/08/2009 through 12/19/2009 requiring dose reduction for thrombocytopenia and leukopenia. CT scan findings were retroperitoneal and anterior adenopathy status post CT guided core biopsy on 03/15/2011 showed no evidence  of malignancy except for fibrosis. 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   Current therapy: Observation and follow up.   Interim History: [name] presents today for a follow up visit. Since her last visit, she report no new issues. She is still having post herpetic neuralgia and her neuropathic pain is improving. She report no GI symptoms, no bleeding or weight loss. Her energy levels stable. She has no nausea, vomiting or abdominal pain. She has had no changes of bowel function. She is still very functional and lives independently. She was able to drive and continue to do so as well as attend to her activity of daily living. She has not reported any bleeding problems nor any other constitutional symptoms. She does not report any headaches or blurry vision or syncope. She has not reported any chest pain or palpitations. She does not report any skeletal complaints. She is not on any lymphadenopathy or petechiae. She does not report any mood issues. Rest of her review of systems unremarkable.   Medications: I have reviewed the patient's current medications. Current Outpatient Prescriptions  Medication Sig Dispense Refill  . albuterol (PROVENTIL HFA;VENTOLIN  HFA) 108 (90 BASE) MCG/ACT inhaler Inhale 2 puffs into the lungs every 6 (six) hours as needed for wheezing or shortness of breath.     Marland Kitchen aspirin EC 81 MG tablet Take 81 mg by mouth daily.    . Azilsartan-Chlorthalidone (EDARBYCLOR) 40-25 MG TABS Take 1 tablet by mouth daily.    . cetirizine (ZYRTEC) 10 MG tablet Take 10 mg by mouth daily.    . cloNIDine (CATAPRES) 0.1 MG tablet Take 0.1 mg by mouth daily.     Marland Kitchen diltiazem (CARDIZEM CD) 180 MG 24 hr capsule Take 180 mg by mouth daily.    . fentaNYL (DURAGESIC - DOSED MCG/HR) 50 MCG/HR Place 1 patch (50 mcg total) onto the skin every 3 (three) days. 5 patch 0  . ferrous sulfate 325 (65 FE) MG tablet Take 325 mg by mouth 3 (three) times daily with meals.    . Fluticasone-Salmeterol (ADVAIR) 100-50 MCG/DOSE AEPB Inhale 2 puffs into the lungs 2 (two) times daily as needed (wheezing).     . furosemide (LASIX) 40 MG tablet Take 40 mg by mouth daily.    . Multiple Vitamins-Minerals (CENTRUM SILVER ADULT 50+ PO) Take 1 tablet by mouth daily.    . nebivolol (BYSTOLIC) 10 MG tablet Take 10 mg by mouth at bedtime. Take if  SBP > 145  .  Take at  Bedtime.    . potassium chloride SA (K-DUR,KLOR-CON) 20 MEQ tablet Take 20 mEq by mouth 3 (three) times daily.     . pregabalin (LYRICA) 50 MG capsule Take 50 mg by mouth 2 (two) times daily.    . traZODone (DESYREL) 50 MG tablet Take  50 mg by mouth at bedtime.     No current facility-administered medications for this visit.    Allergies: No Known Allergies  Past Medical History, Surgical history, Social history, and Family History were reviewed and updated.   Physical Exam: Blood pressure 127/60, pulse 56, temperature 98.1 F (36.7 C), temperature source Oral, resp. rate 17, height _0  (1.6 m), weight 179 lb 1.6 oz (81.239 kg), SpO2 97 %. ECOG: 1 General appearance: alert is not in any distress. Head: Normocephalic, without obvious abnormality, atraumatic Neck: no adenopathy Lymph nodes: Cervical,  supraclavicular, and axillary nodes normal. Heart:regular rate and rhythm, S1, S2 normal, no murmur, click, rub or gallop Lung:chest clear, no wheezing, rales, normal symmetric air entry Abdomin: soft, non-tender, without masses or organomegaly EXT:no erythema, induration, or nodules Skin: No blisters or lesions noted on her skin examination.  Lab Results: Lab Results  Component Value Date   WBC 2.7* 07/02/2014   HGB 11.7 07/02/2014   HCT 34.9 07/02/2014   MCV 94.8 07/02/2014   PLT 146 07/02/2014     Chemistry      Component Value Date/Time   NA 141 07/02/2014 0912   NA 135* 09/21/2013 1655   NA 142 06/10/2011 1152   K 3.5 07/02/2014 0912   K 3.8 09/21/2013 1655   K 4.4 06/10/2011 1152   CL 97 09/21/2013 1655   CL 106 06/19/2012 0936   CL 96* 06/10/2011 1152   CO2 28 07/02/2014 0912   CO2 25 09/21/2013 1655   CO2 29 06/10/2011 1152   BUN 17.4 07/02/2014 0912   BUN 12 09/21/2013 1655   BUN 12 06/10/2011 1152   CREATININE 0.9 07/02/2014 0912   CREATININE 0.73 09/21/2013 1655   CREATININE 0.7 06/10/2011 1152      Component Value Date/Time   CALCIUM 8.6 07/02/2014 0912   CALCIUM 8.8 09/21/2013 1655   CALCIUM 8.8 06/10/2011 1152   ALKPHOS 49 07/02/2014 0912   ALKPHOS 55 03/26/2013 1200   ALKPHOS 53 06/10/2011 1152   AST 19 07/02/2014 0912   AST 24 03/26/2013 1200   AST 23 06/10/2011 1152   ALT 12 07/02/2014 0912   ALT 12 03/26/2013 1200   ALT 21 06/10/2011 1152   BILITOT 0.43 07/02/2014 0912   BILITOT 0.5 03/26/2013 1200   BILITOT 0.50 06/10/2011 1152      Current Outpatient Prescriptions  Medication Sig Dispense Refill  . albuterol (PROVENTIL HFA;VENTOLIN HFA) 108 (90 BASE) MCG/ACT inhaler Inhale 2 puffs into the lungs every 6 (six) hours as needed for wheezing or shortness of breath.     Marland Kitchen aspirin EC 81 MG tablet Take 81 mg by mouth daily.    . Azilsartan-Chlorthalidone (EDARBYCLOR) 40-25 MG TABS Take 1 tablet by mouth daily.    . cetirizine (ZYRTEC) 10 MG  tablet Take 10 mg by mouth daily.    . cloNIDine (CATAPRES) 0.1 MG tablet Take 0.1 mg by mouth daily.     Marland Kitchen diltiazem (CARDIZEM CD) 180 MG 24 hr capsule Take 180 mg by mouth daily.    . fentaNYL (DURAGESIC - DOSED MCG/HR) 50 MCG/HR Place 1 patch (50 mcg total) onto the skin every 3 (three) days. 5 patch 0  . ferrous sulfate 325 (65 FE) MG tablet Take 325 mg by mouth 3 (three) times daily with meals.    . Fluticasone-Salmeterol (ADVAIR) 100-50 MCG/DOSE AEPB Inhale 2 puffs into the lungs 2 (two) times daily as needed (wheezing).     . furosemide (LASIX) 40 MG  tablet Take 40 mg by mouth daily.    . Multiple Vitamins-Minerals (CENTRUM SILVER ADULT 50+ PO) Take 1 tablet by mouth daily.    . nebivolol (BYSTOLIC) 10 MG tablet Take 10 mg by mouth at bedtime. Take if  SBP > 145  .  Take at  Bedtime.    . potassium chloride SA (K-DUR,KLOR-CON) 20 MEQ tablet Take 20 mEq by mouth 3 (three) times daily.     . pregabalin (LYRICA) 50 MG capsule Take 50 mg by mouth 2 (two) times daily.    . traZODone (DESYREL) 50 MG tablet Take 50 mg by mouth at bedtime.     No current facility-administered medications for this visit.   EXAM: CT CHEST, ABDOMEN, AND PELVIS WITH CONTRAST  TECHNIQUE: Multidetector CT imaging of the chest, abdomen and pelvis was performed following the standard protocol during bolus administration of intravenous contrast.  CONTRAST: 120m OMNIPAQUE IOHEXOL 300 MG/ML SOLN  COMPARISON: Chest CT 07/02/2013 and PET-CT 03/02/2011  FINDINGS: CT CHEST FINDINGS  Chest wall: No breast masses, supraclavicular or axillary lymphadenopathy. Small scattered lymph nodes are stable. The thyroid gland is grossly normal. The bony thorax is intact. No destructive bone lesions or spinal canal compromise. Stable moderate degenerative changes involving the thoracic spine.  Mediastinum: The heart is normal in size. No pericardial effusion. The aorta is normal in caliber. No dissection. The  esophagus is grossly normal. Stable small mediastinal and hilar lymph nodes. No mass or overt adenopathy.  Lungs/pleura: No acute pulmonary findings. No worrisome pulmonary lesions to suggest metastatic disease. There is mild bilateral lower lobe and right middle lobe peribronchial thickening and bronchiectasis. No pleural effusion.  CT ABDOMEN AND PELVIS FINDINGS  Hepatobiliary: Stable large hepatic hemangioma occupying segment 8 and 5 of the liver. Stable low-attenuation lesion located between segment 2 and segment 4A. This is a previous radiofrequency ablation site. No findings for progressive/recurrent tumor. No new worrisome hepatic lesions. The gallbladder is normal. No intra or extrahepatic biliary dilatation.  Pancreas: Normal and stable.  Spleen: Normal.  Adrenals/Urinary Tract: The adrenal glands and kidneys are unremarkable.  Stomach/Bowel: The stomach, duodenum, small bowel and colon are unremarkable. No inflammatory changes, mass lesions or obstructive findings. The terminal ileum is normal. The appendix is normal. Stable surgical changes involving the descending colon.  Vascular/Lymphatic: Stable small mesenteric and retroperitoneal lymph nodes. No mass or overt adenopathy.  Other: The bladder is unremarkable. The uterus is surgically absent. No pelvic mass or adenopathy. No free pelvic fluid collections. No inguinal mass or adenopathy. Small stable lymph nodes are noted.  Musculoskeletal: No significant bony findings.  IMPRESSION: 1. No CT findings for metastatic disease involving the chest. Stable mediastinal and hilar lymph nodes. 2. Stable large hepatic hemangioma occupying segment 8 and segment 5 of the liver. 3. Stable treated lesion located between segment 2 and segment 4A. No findings for residual or recurrent tumor. No new metastatic hepatic lesions. 4. Stable small mesenteric and retroperitoneal lymph nodes but no mass or overt  adenopathy.    Impression and Plan: 69year old woman with:  1. Stage IV colon cancer with a history of an isolated metastasis to the liver. Status post colectomy as well as radiofrequency ablation to an isolated liver met on 05/05/2009 at [facility] Status post chemotherapy with FOLFOX 6 and Avastin from 06/08/2009 through 12/19/2009.  Her CT scan from 07/02/2014  discussed today and showed no evidence of disease recurrence. The plan is to continue with active surveillance and repeat imaging  studies, laboratory testing and clinical visit and 12 months. If she continues to be disease free in 12 months no further oncology follow-up will be needed at that time.   2. 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 Her port has been removed on 06/2011.   3. Leukocytopenia: This has been chronic in nature and have not dramatically changed.   4. Herpes zoster: Her post herpetic neuralgia seems to be manageable at this time.  5. Followup: Will be in 12 months.       [facility], MD 3/31/20169:50 AM

## 2015-05-05 ENCOUNTER — Telehealth: Payer: Self-pay | Admitting: Neurology

## 2015-05-05 ENCOUNTER — Encounter (HOSPITAL_COMMUNITY): Payer: Self-pay | Admitting: Emergency Medicine

## 2015-05-05 ENCOUNTER — Emergency Department (HOSPITAL_COMMUNITY): Payer: Medicare Other

## 2015-05-05 ENCOUNTER — Emergency Department (HOSPITAL_COMMUNITY)
Admission: EM | Admit: 2015-05-05 | Discharge: 2015-05-05 | Disposition: A | Discharge status: Home / Self Care | Payer: Medicare Other | Attending: Emergency Medicine | Admitting: Emergency Medicine

## 2015-05-05 DIAGNOSIS — R569 Unspecified convulsions: Secondary | ICD-10-CM | POA: Insufficient documentation

## 2015-05-05 DIAGNOSIS — Z8619 Personal history of other infectious and parasitic diseases: Secondary | ICD-10-CM | POA: Diagnosis not present

## 2015-05-05 DIAGNOSIS — D649 Anemia, unspecified: Secondary | ICD-10-CM | POA: Diagnosis not present

## 2015-05-05 DIAGNOSIS — Z85038 Personal history of other malignant neoplasm of large intestine: Secondary | ICD-10-CM | POA: Diagnosis not present

## 2015-05-05 DIAGNOSIS — I10 Essential (primary) hypertension: Secondary | ICD-10-CM | POA: Insufficient documentation

## 2015-05-05 DIAGNOSIS — Z86718 Personal history of other venous thrombosis and embolism: Secondary | ICD-10-CM | POA: Diagnosis not present

## 2015-05-05 DIAGNOSIS — J45909 Unspecified asthma, uncomplicated: Secondary | ICD-10-CM | POA: Insufficient documentation

## 2015-05-05 DIAGNOSIS — M1990 Unspecified osteoarthritis, unspecified site: Secondary | ICD-10-CM | POA: Diagnosis not present

## 2015-05-05 DIAGNOSIS — R4182 Altered mental status, unspecified: Secondary | ICD-10-CM | POA: Diagnosis present

## 2015-05-05 DIAGNOSIS — Z7982 Long term (current) use of aspirin: Secondary | ICD-10-CM | POA: Insufficient documentation

## 2015-05-05 DIAGNOSIS — Z79899 Other long term (current) drug therapy: Secondary | ICD-10-CM | POA: Insufficient documentation

## 2015-05-05 LAB — CBC WITH DIFFERENTIAL/PLATELET
Basophils Absolute: 0 10*3/uL (ref 0.0–0.1)
Basophils Relative: 0 %
EOS ABS: 0 10*3/uL (ref 0.0–0.7)
Eosinophils Relative: 0 %
HEMATOCRIT: 34.4 % — AB (ref 36.0–46.0)
HEMOGLOBIN: 11 g/dL — AB (ref 12.0–15.0)
LYMPHS ABS: 0.6 10*3/uL — AB (ref 0.7–4.0)
Lymphocytes Relative: 15 %
MCH: 30.6 pg (ref 26.0–34.0)
MCHC: 32 g/dL (ref 30.0–36.0)
MCV: 95.6 fL (ref 78.0–100.0)
Monocytes Absolute: 0.3 10*3/uL (ref 0.1–1.0)
Monocytes Relative: 8 %
Neutro Abs: 2.7 10*3/uL (ref 1.7–7.7)
Neutrophils Relative %: 77 %
Platelets: 155 10*3/uL (ref 150–400)
RBC: 3.6 MIL/uL — AB (ref 3.87–5.11)
RDW: 13.5 % (ref 11.5–15.5)
WBC: 3.6 10*3/uL — ABNORMAL LOW (ref 4.0–10.5)

## 2015-05-05 LAB — COMPREHENSIVE METABOLIC PANEL
ALK PHOS: 47 U/L (ref 38–126)
ALT: 13 U/L — AB (ref 14–54)
ANION GAP: 5 (ref 5–15)
AST: 23 U/L (ref 15–41)
Albumin: 2.7 g/dL — ABNORMAL LOW (ref 3.5–5.0)
BILIRUBIN TOTAL: 0.3 mg/dL (ref 0.3–1.2)
BUN: 13 mg/dL (ref 6–20)
CO2: 26 mmol/L (ref 22–32)
CREATININE: 0.83 mg/dL (ref 0.44–1.00)
Calcium: 8.4 mg/dL — ABNORMAL LOW (ref 8.9–10.3)
Chloride: 108 mmol/L (ref 101–111)
Glucose, Bld: 111 mg/dL — ABNORMAL HIGH (ref 65–99)
Potassium: 4.1 mmol/L (ref 3.5–5.1)
Sodium: 139 mmol/L (ref 135–145)
TOTAL PROTEIN: 6.1 g/dL — AB (ref 6.5–8.1)

## 2015-05-05 LAB — URINALYSIS, ROUTINE W REFLEX MICROSCOPIC
Bilirubin Urine: NEGATIVE
GLUCOSE, UA: NEGATIVE mg/dL
Hgb urine dipstick: NEGATIVE
Ketones, ur: NEGATIVE mg/dL
LEUKOCYTES UA: NEGATIVE
NITRITE: NEGATIVE
PH: 6.5 (ref 5.0–8.0)
PROTEIN: 30 mg/dL — AB
Specific Gravity, Urine: 1.03 (ref 1.005–1.030)

## 2015-05-05 LAB — URINE MICROSCOPIC-ADD ON

## 2015-05-05 MED ORDER — LEVETIRACETAM 500 MG PO TABS
500.0000 mg | ORAL_TABLET | Freq: Once | ORAL | Status: AC
  Administered 2015-05-05: 500 mg via ORAL
  Filled 2015-05-05: qty 1

## 2015-05-05 MED ORDER — LEVETIRACETAM 500 MG PO TABS: 500.0000 mg | ORAL_TABLET | Freq: Two times a day (BID) | ORAL | Status: DC

## 2015-05-05 NOTE — ED Notes (Signed)
Taken to xray at this time. 

## 2015-05-05 NOTE — Telephone Encounter (Signed)
[name] called to advise, patient was seen at [facility] for seizure this morning, states [facility] recommended she get in today to see Dr. Krista Blue.

## 2015-05-05 NOTE — ED Provider Notes (Signed)
CSN: [id number]     Arrival date & time 05/05/15  0129 History  By signing my name below, I, Altamease Oiler, attest that this documentation has been prepared under the direction and in the presence of Merryl Hacker, MD. Electronically Signed: Altamease Oiler, ED Scribe. 05/05/2015. 2:02 AM   Chief Complaint  Patient presents with  . Altered Mental Status   The history is provided by the patient and a relative.   Brought in by EMS, [name] is a 70 y.o. female with history of seizures, HTN, and metastatic colon cancer s/p chemotherapy who presents to the Emergency Department complaining of AMS. Family at bedside states that the pt was at another relatives house when she started to make gurgling sounds and stopped recognizing family. Blood was noted to be coming from her mouth after the episode. Pt has no recollection of the episode, she remembers putting on her pajamas tonight and "waking up" in the ambulance. Her last seizure was 5 years ago and she is not on seizure medication. She recently had shingles and post hepatic neuralgia. Pt denies fever, chills, cough, congestion, and recent falls.   Past Medical History  Diagnosis Date  . 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 Hypertension   . Colon cancer ([place])   . Asthma   . DVT (deep venous thrombosis) ([facility])     right jugular  . Anemia   . Arthritis   . Clotting disorder ([place])   . Seizures ([place])   . PONV (postoperative nausea and vomiting)   . Spinal headache   . Shingles rash    Past Surgical History  Procedure Laterality Date  . Abdominal hysterectomy  2012  . Small intestine surgery    . Colonoscopy    . Portacath placement    . Colon surgery    . Radiofrequency ablation liver tumor     Family History  Problem Relation Age of Onset  . Hypertension Father   . Cancer Brother     colon  . Cancer Daughter     colon  . Breast cancer Sister    Social History  Substance Use Topics  .  Smoking status: Never Smoker   . Smokeless tobacco: Never Used  . Alcohol Use: No   OB History    No data available     Review of Systems  Constitutional: Negative for fever and chills.  HENT: Negative for congestion.   Respiratory: Negative for cough.   Neurological: Positive for seizures.  All other systems reviewed and are negative.     Allergies  Review of patient's allergies indicates no known allergies.  Home Medications   Prior to Admission medications   Medication Sig Start Date End Date Taking? Authorizing Provider  aspirin EC 81 MG tablet Take 81 mg by mouth daily.   Yes Historical Provider, MD  cetirizine (ZYRTEC) 10 MG tablet Take 10 mg by mouth at bedtime.    Yes Historical Provider, MD  cloNIDine (CATAPRES) 0.1 MG tablet Take 0.1 mg by mouth 2 (two) times daily.    Yes Historical Provider, MD  diltiazem (CARDIZEM CD) 180 MG 24 hr capsule Take 180 mg by mouth daily. 12/06/13  Yes Historical Provider, MD  ferrous sulfate 325 (65 FE) MG tablet Take 325 mg by mouth 3 (three) times daily with meals.   Yes Historical Provider, MD  furosemide (LASIX) 20 MG tablet Take 20 mg by mouth daily.   Yes  Historical Provider, MD  gabapentin (NEURONTIN) 300 MG capsule Take 300 mg by mouth 3 (three) times daily.   Yes Historical Provider, MD  losartan-hydrochlorothiazide (HYZAAR) 100-12.5 MG tablet Take 1 tablet by mouth daily.   Yes Historical Provider, MD  Multiple Vitamins-Minerals (CENTRUM SILVER ADULT 50+ PO) Take 1 tablet by mouth daily.   Yes Historical Provider, MD  nebivolol (BYSTOLIC) 10 MG tablet Take 10 mg by mouth every other day. Take at bedtime   Yes Historical Provider, MD  PARoxetine (PAXIL) 20 MG tablet Take 20 mg by mouth daily.   Yes Historical Provider, MD  polyethylene glycol (MIRALAX / GLYCOLAX) packet Take 17 g by mouth daily as needed for mild constipation.   Yes Historical Provider, MD  potassium chloride SA (K-DUR,KLOR-CON) 20 MEQ tablet Take 20 mEq by mouth  daily.    Yes Historical Provider, MD  albuterol (PROVENTIL HFA;VENTOLIN HFA) 108 (90 BASE) MCG/ACT inhaler Inhale 2 puffs into the lungs every 6 (six) hours as needed for wheezing or shortness of breath.     Historical Provider, MD  Fluticasone-Salmeterol (ADVAIR) 100-50 MCG/DOSE AEPB Inhale 2 puffs into the lungs 2 (two) times daily as needed (wheezing).     Historical Provider, MD  levETIRAcetam (KEPPRA) 500 MG tablet Take 1 tablet (500 mg total) by mouth 2 (two) times daily. 05/05/15   Merryl Hacker, MD   BP 149/64 mmHg  Pulse 83  Temp(Src) 99.2 F (37.3 C) (Oral)  Resp 20  Ht '5\' 2"'  (1.575 m)  Wt 174 lb (78.926 kg)  BMI 31.82 kg/m2  SpO2 98% Physical Exam  Constitutional: She is oriented to person, place, and time. She appears well-developed and well-nourished.  elderly  HENT:  Head: Normocephalic and atraumatic.  Evidence of biting the anterior aspect of the tongue  Cardiovascular: Normal rate, regular rhythm and normal heart sounds.   No murmur heard. Pulmonary/Chest: Effort normal and breath sounds normal. No respiratory distress. She has no wheezes.  Abdominal: Soft. Bowel sounds are normal. There is no tenderness. There is no rebound.  Neurological: She is alert and oriented to person, place, and time.  Radial nerves II through XII intact, 5 out of 5 strength in all 4 extremities, no dysmetria to finger-nose-finger  Skin: Skin is warm and dry.  Psychiatric: She has a normal mood and affect.  Nursing note and vitals reviewed.   ED Course  Procedures (including critical care time) DIAGNOSTIC STUDIES: Oxygen Saturation is 98% on RA,  normal by my interpretation.    COORDINATION OF CARE: 2:00 AM Discussed treatment plan which includes lab work with the pt and her family  at bedside and they agreed to plan.  Labs Review Labs Reviewed  CBC WITH DIFFERENTIAL/PLATELET - Abnormal; Notable for the following:    WBC 3.6 (*)    RBC 3.60 (*)    Hemoglobin 11.0 (*)    HCT  34.4 (*)    Lymphs Abs 0.6 (*)    All other components within normal limits  COMPREHENSIVE METABOLIC PANEL - Abnormal; Notable for the following:    Glucose, Bld 111 (*)    Calcium 8.4 (*)    Total Protein 6.1 (*)    Albumin 2.7 (*)    ALT 13 (*)    All other components within normal limits  URINALYSIS, ROUTINE W REFLEX MICROSCOPIC (NOT AT [facility]) - Abnormal; Notable for the following:    APPearance CLOUDY (*)    Protein, ur 30 (*)    All other components within  normal limits  URINE MICROSCOPIC-ADD ON - Abnormal; Notable for the following:    Squamous Epithelial / LPF 0-5 (*)    Bacteria, UA FEW (*)    Casts HYALINE CASTS (*)    All other components within normal limits    Imaging Review Ct Head Wo Contrast  05/05/2015  CLINICAL DATA:  Seizure EXAM: CT HEAD WITHOUT CONTRAST TECHNIQUE: Contiguous axial images were obtained from the base of the skull through the vertex without intravenous contrast. COMPARISON:  Brain MRI 09/21/2013 FINDINGS: Skull and Sinuses:Negative for fracture or destructive process. The visualized mastoids, middle ears, and imaged paranasal sinuses are clear. Visualized orbits: Negative. Brain: No evidence of acute infarction, hemorrhage, hydrocephalus, or mass lesion/mass effect. Stable pattern of moderate for age patchy low-density in the cerebral white matter, consistent with chronic small vessel disease. IMPRESSION: 1. No acute finding. 2. Moderate chronic small vessel disease. Electronically Signed   By: Monte Fantasia M.D.   On: 05/05/2015 03:09   I have personally reviewed and evaluated these lab results as part of my medical decision-making.   EKG Interpretation   Date/Time:  Monday May 05 2015 02:16:18 EST Ventricular Rate:  71 PR Interval:  145 QRS Duration: 79 QT Interval:  395 QTC Calculation: 429 R Axis:   44 Text Interpretation:  Sinus rhythm Atrial premature complex Borderline T  abnormalities, diffuse leads Confirmed by Linda Biehn  MD, Carmina Walle  ([id number]) on  05/05/2015 11:04:02 PM      MDM   Final diagnoses:  Seizure [facility])    Patient presents with altered mental status. History of seizures. Has not been on seizure medication since 2015. I reviewed notes from neurology. She was previously on Keppra. Her story is highly suspicious of seizure. It appears she had a postictal state. She is now awake, alert, and oriented. Neurologically intact. She is at her baseline per the family.  Lab work shows no evidence of metabolic abnormality. No obvious infection.  She has previously felt well. CT head is negative. Discussed with neurology on call. Will restart Keppra 500 mg twice a day. Follow-up with neurology as soon as possible.  After history, exam, and medical workup I feel the patient has been appropriately medically screened and is safe for discharge home. Pertinent diagnoses were discussed with the patient. Patient was given return precautions.   I personally performed the services described in this documentation, which was scribed in my presence. The recorded information has been reviewed and is accurate.    Merryl Hacker, MD 05/05/15 [phone]

## 2015-05-05 NOTE — Discharge Instructions (Signed)

## 2015-05-05 NOTE — ED Notes (Signed)
Pt given graham crackers and coke

## 2015-05-05 NOTE — ED Notes (Signed)
Brought in via EMS from home.  Per family found patient on couch "breathing funny" and bleeding from the mouth.  Upon arrival of ems found confused with small lac to tip of tongue.  Hx of seizures but none for 3 years.  Alert and answering questions in triage.

## 2015-05-05 NOTE — Telephone Encounter (Signed)
Spoke to her daughter - states her mother had a seizure and was seen in the ED today. She is doing better now.  They were instructed to start Keppra 500mg , BID and follow up with Dr. Krista Blue.  An appt has been scheduled for her this week and instructed her to continue the medication, as prescribed.

## 2015-05-08 ENCOUNTER — Encounter: Payer: Self-pay | Admitting: Neurology

## 2015-05-08 ENCOUNTER — Ambulatory Visit (INDEPENDENT_AMBULATORY_CARE_PROVIDER_SITE_OTHER): Payer: Medicare Other | Admitting: Neurology

## 2015-05-08 VITALS — BP 124/78 | HR 64 | Ht 62.0 in | Wt 169.0 lb

## 2015-05-08 DIAGNOSIS — R569 Unspecified convulsions: Secondary | ICD-10-CM | POA: Diagnosis not present

## 2015-05-08 MED ORDER — LEVETIRACETAM 500 MG PO TABS
500.0000 mg | ORAL_TABLET | Freq: Two times a day (BID) | ORAL | Status: DC
Start: 2015-05-08 — End: 2016-09-23

## 2015-05-08 NOTE — Progress Notes (Signed)
Chief Complaint  Patient presents with  . Seizures    She is here with her daughter, [name].  She had a seizure while sleeping on 05/05/15.  [name] (other daughter) heard her making noises and found her having a seizure around midnight.  She was taken to the hospital and restarted on Keppra 552m, twice daily.     [facility]  PATIENT: [name]: 2August 11, 1947  HISTORY OF PRESENT ILLNESS:  [name], is a 70years old right-handed female, follow-up for seizure, she only has one seizure in February second 2012   She has past medical history of hypertension, colon cancer in 2011, status post surgery, chemotherapy, she also underwent hysterectomy in February 2012 for uterus prolapse, she presented with seizure in May 27, 2010. She was discharged from nursing home after her hysterectomy due to the needs for prolonged antibiotic infusion,she is not sure the name of the medications.  in May 27, 2010 while at nursing home, walking in her room in the early afternoon, without warning signs, she had one seizure, was taken to the emergency room. She had no recollection of what has happened, wake up in the emergency room confused. CAT scan without contrast of brain was normal. Laboratory evaluation has demonstrate decreased white count 2.6, anemia hemoglobin 9.3, hematocrit 29.7, UA showed UTI, moderate esterase, she apparently was started on Keppra 250 mg b.i.d. 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 EEG normal.   She has no recurrent seizure, has stopped Keppra since 2015, no recurrent seizure, intermittent left thoracic pain due to post  herpetic neuralgia, from shingles, no gait difficulty, lives alone, still driving,  UPDATE Feb 2nd 2017:   She had one nocturnal seizure in May 04 2015 during sleep, witnessed by her daughter, testing for few minutes followed by positive and confusion, with tongue biting, was taken to the emergency room, I have reviewed laboratory evaluation, evidence of UTI, low albumin of 2.7, mild anemia 11, she is now taking Keppra 500 mg twice a day, tolerating it well  REVIEW OF SYSTEMS: Full 14 system review of systems performed and notable only for those listed, all others are neg: Dizziness, seizure,   ALLERGIES: No Known Allergies  HOME MEDICATIONS: Outpatient Prescriptions Prior to Visit  Medication Sig Dispense Refill  . albuterol (PROVENTIL HFA;VENTOLIN HFA) 108 (90 BASE) MCG/ACT inhaler Inhale 2 puffs into the lungs every 6 (six) hours as needed for wheezing or shortness of breath.     .Marland Kitchenaspirin EC 81 MG tablet Take 81 mg by mouth daily.    . cetirizine (ZYRTEC) 10 MG tablet Take 10 mg by mouth at bedtime.     . cloNIDine (CATAPRES) 0.1 MG tablet Take 0.1 mg by mouth 2 (two) times daily.     .Marland Kitchendiltiazem (CARDIZEM CD) 180 MG 24 hr capsule Take 180 mg by mouth daily.    . ferrous sulfate 325 (65 FE) MG tablet Take 325 mg by mouth 3 (three) times daily with meals.    . Fluticasone-Salmeterol (ADVAIR) 100-50 MCG/DOSE AEPB Inhale 2 puffs into the lungs 2 (two) times daily as needed (wheezing).     . furosemide (LASIX) 20 MG tablet Take 20 mg by mouth daily.    .Marland Kitchen  gabapentin (NEURONTIN) 300 MG capsule Take 300 mg by mouth 3 (three) times daily.    Marland Kitchen levETIRAcetam (KEPPRA) 500 MG tablet Take 1 tablet (500 mg total) by mouth 2 (two) times daily. 60 tablet 0  . losartan-hydrochlorothiazide (HYZAAR) 100-12.5 MG tablet Take 1 tablet by mouth daily.    . Multiple Vitamins-Minerals (CENTRUM SILVER ADULT 50+ PO) Take 1 tablet by mouth daily.    . nebivolol (BYSTOLIC) 10 MG tablet Take 10 mg by mouth  every other day. Take at bedtime    . PARoxetine (PAXIL) 20 MG tablet Take 20 mg by mouth daily.    . polyethylene glycol (MIRALAX / GLYCOLAX) packet Take 17 g by mouth daily as needed for mild constipation.    . potassium chloride SA (K-DUR,KLOR-CON) 20 MEQ tablet Take 20 mEq by mouth daily.      No facility-administered medications prior to visit.    PAST MEDICAL HISTORY: Past Medical History  Diagnosis Date  . 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 Hypertension   . Colon cancer ([place])   . Asthma   . DVT (deep venous thrombosis) ([facility])     right jugular  . Anemia   . Arthritis   . Clotting disorder ([place])   . Seizures ([place])   . PONV (postoperative nausea and vomiting)   . Spinal headache   . Shingles rash     PAST SURGICAL HISTORY: Past Surgical History  Procedure Laterality Date  . Abdominal hysterectomy  2012  . Small intestine surgery    . Colonoscopy    . Portacath placement    . Colon surgery    . Radiofrequency ablation liver tumor      FAMILY HISTORY: Family History  Problem Relation Age of Onset  . Hypertension Father   . Cancer Brother     colon  . Cancer Daughter     colon  . Breast cancer Sister     SOCIAL HISTORY: Social History   Social History  . Marital Status: Divorced    Spouse Name: N/A  . Number of Children: 3  . Years of Education: 12+   Occupational History  . Retired    Social History Main Topics  . Smoking status: Never Smoker   . Smokeless tobacco: Never Used  . Alcohol Use: No  . Drug Use: No  . Sexual Activity: No   Other Topics Concern  . Not on file   Social History Narrative   Patient lives at home alone.    Patient is divorced.    Patient has 3 children.    Patient has some college.    Patient is retired.      PHYSICAL EXAM  Filed Vitals:   05/08/15 1512  BP: 124/78  Pulse: 64  Height: '5\' 2"'  (1.575 m)  Weight: 169 lb (76.658 kg)   Body mass index is 30.9 kg/(m^2).   PHYSICAL  EXAMNIATION:  Gen: NAD, conversant, well nourised, obese, well groomed                     Cardiovascular: Regular rate rhythm, no peripheral edema, warm, nontender. Eyes: Conjunctivae clear without exudates or hemorrhage Neck: Supple, no carotid bruise. Pulmonary: Clear to auscultation bilaterally   NEUROLOGICAL EXAM:  MENTAL STATUS: Tired-looking elderly female Speech:    Speech is normal; fluent and spontaneous with normal comprehension.  Cognition:    The patient is oriented to person, place, and time;  recent and remote memory intact;     language fluent;     normal attention, concentration,     fund of knowledge.  CRANIAL NERVES: CN II: Visual fields are full to confrontation. Fundoscopic exam is normal with sharp discs and no vascular changes. Venous pulsations are present bilaterally. Pupils are 4 mm and briskly reactive to light. Visual acuity is 20/20 bilaterally. CN III, IV, VI: extraocular movement are normal. No ptosis. CN V: Facial sensation is intact to pinprick in all 3 divisions bilaterally. Corneal responses are intact.  CN VII: Face is symmetric with normal eye closure and smile. CN VIII: Hearing is normal to rubbing fingers CN IX, X: Palate elevates symmetrically. Phonation is normal. CN XI: Head turning and shoulder shrug are intact CN XII: Tongue is midline with normal movements and no atrophy.  MOTOR: There is no pronator drift of out-stretched arms. Muscle bulk and tone are normal. Muscle strength is normal.   Shoulder abduction Shoulder external rotation Elbow flexion Elbow extension Wrist flexion Wrist extension Finger abduction Hip flexion Knee flexion Knee extension Ankle dorsi flexion Ankle plantar flexion  R '5 5 5 5 5 5 5 5 5 5 5 5  ' L '5 5 5 5 5 5 5 5 5 5 5 5    ' REFLEXES: Reflexes are 2+ and symmetric at the biceps, triceps, knees, and ankles. Plantar responses are flexor.  SENSORY: Light touch, pinprick, position sense, and vibration sense  are intact in fingers and toes.  COORDINATION: Rapid alternating movements and fine finger movements are intact. There is no dysmetria on finger-to-nose and heel-knee-shin. There are no abnormal or extraneous movements.   GAIT/STANCE: Posture is normal. Gait is steady with normal steps, base, arm swing, and turning. Romberg is absent.   DIAGNOSTIC DATA (LABS, IMAGING, TESTING) - I reviewed patient records, labs, notes, testing and imaging myself where available.  Lab Results  Component Value Date   WBC 3.6* 05/05/2015   HGB 11.0* 05/05/2015   HCT 34.4* 05/05/2015   MCV 95.6 05/05/2015   PLT 155 05/05/2015      Component Value Date/Time   NA 139 05/05/2015 0308   NA 141 07/02/2014 0912   NA 142 06/10/2011 1152   K 4.1 05/05/2015 0308   K 3.5 07/02/2014 0912   K 4.4 06/10/2011 1152   CL 108 05/05/2015 0308   CL 106 06/19/2012 0936   CL 96* 06/10/2011 1152   CO2 26 05/05/2015 0308   CO2 28 07/02/2014 0912   CO2 29 06/10/2011 1152   GLUCOSE 111* 05/05/2015 0308   GLUCOSE 92 07/02/2014 0912   GLUCOSE 93 06/19/2012 0936   GLUCOSE 88 06/10/2011 1152   BUN 13 05/05/2015 0308   BUN 17.4 07/02/2014 0912   BUN 12 06/10/2011 1152   CREATININE 0.83 05/05/2015 0308   CREATININE 0.9 07/02/2014 0912   CREATININE 0.7 06/10/2011 1152   CALCIUM 8.4* 05/05/2015 0308   CALCIUM 8.6 07/02/2014 0912   CALCIUM 8.8 06/10/2011 1152   PROT 6.1* 05/05/2015 0308   PROT 6.6 07/02/2014 0912   PROT 7.5 06/10/2011 1152   ALBUMIN 2.7* 05/05/2015 0308   ALBUMIN 3.1* 07/02/2014 0912   ALBUMIN 3.2* 06/10/2011 1152   AST 23 05/05/2015 0308   AST 19 07/02/2014 0912   AST 23 06/10/2011 1152   ALT 13* 05/05/2015 0308   ALT 12 07/02/2014 0912   ALT 21 06/10/2011 1152   ALKPHOS 47 05/05/2015 0308   ALKPHOS 49 07/02/2014 0912   ALKPHOS 53  06/10/2011 1152   BILITOT 0.3 05/05/2015 0308   BILITOT 0.43 07/02/2014 0912   BILITOT 0.50 06/10/2011 1152   GFRNONAA >60 05/05/2015 0308   GFRAA >60  05/05/2015 0308    ASSESSMENT AND PLAN  70 y.o. year old female  has a past medical history of metastatic colon cancer to liver (dx'd 02/25/09); presenting with 1 episode of seizure in May 27, 2010. Recurrent seizure in May 04 2015  Complete evaluation with MRI of the brain with and without contrast EEG Continue Keppra 500 twice a day    Marcial Pacas, M.D. Ph.D.  [facility] [place], [place] [address] Phone: [phone] Fax:      [phone]

## 2015-06-05 ENCOUNTER — Encounter: Payer: Self-pay | Admitting: Neurology

## 2015-06-05 ENCOUNTER — Ambulatory Visit (INDEPENDENT_AMBULATORY_CARE_PROVIDER_SITE_OTHER): Payer: Medicare Other | Admitting: Neurology

## 2015-06-05 VITALS — BP 147/87 | HR 72 | Ht 62.0 in | Wt 169.0 lb

## 2015-06-05 DIAGNOSIS — G40309 Generalized idiopathic epilepsy and epileptic syndromes, not intractable, without status epilepticus: Secondary | ICD-10-CM

## 2015-06-05 DIAGNOSIS — C189 Malignant neoplasm of colon, unspecified: Secondary | ICD-10-CM

## 2015-06-05 NOTE — Progress Notes (Signed)
Chief Complaint  Patient presents with  . Seizures    She is here with her daughter, [name]. She has continued to take Keppra '500mg'$  twice daily.  Reports having one event of seizure-like activity since last seen. She has not missed any doses of her medication. She has not had her EEG or MRI yet.     [facility]  PATIENT: [name] DOB: 1945-09-20   HISTORY OF PRESENT ILLNESS:  [name], is a 70 years old right-handed female, follow-up for seizure, she only has one seizure in February second 2012   She has past medical history of hypertension, colon cancer in 2011, status post surgery, chemotherapy, she also underwent hysterectomy in February 2012 for uterus prolapse, she presented with seizure in May 27, 2010.  She was discharged from nursing home after her hysterectomy due to the needs for prolonged antibiotic infusion,she is not sure the name of the medications.  in May 27, 2010 while at nursing home, walking in her room in the early afternoon, without warning signs, she had one seizure, was taken to the emergency room. She had no recollection of what has happened, wake up in the emergency room confused. CAT scan without contrast of brain was normal. Laboratory evaluation has demonstrate decreased white count 2.6, anemia hemoglobin 9.3, hematocrit 29.7, UA showed UTI, moderate esterase, she apparently was started on Keppra 250 mg b.i.d. 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 EEG normal.   She has no recurrent seizure, has stopped Keppra since 2015, no recurrent seizure, intermittent left thoracic pain due to post herpetic neuralgia, from shingles,  no gait difficulty, lives alone, still driving,  UPDATE Feb 2nd 2017:  She had one nocturnal seizure in May 04 2015 during sleep, witnessed by her daughter, tonic-clonic seizure for a few minutes followed by post event confusion, with tongue biting, was taken to the emergency room, I have reviewed laboratory evaluation, evidence of UTI, low albumin of 2.7, mild anemia 11, she is now taking Keppra 500 mg twice a day, tolerating it well  UPDATE June 05 2015: Since last visit in Feb 2nd 2017, she fell twice, her left leg gave out underneath her, she had no confusion, no seizure-like activities,  She has not had MRI and EEG, she is able to tolerating Keppra 500 mg twice a day well   REVIEW OF SYSTEMS: Full 14 system review of systems performed and notable only for those listed, all others are neg: Dizziness, seizure,   ALLERGIES: No Known Allergies  HOME MEDICATIONS: Outpatient Prescriptions Prior to Visit  Medication Sig Dispense Refill  . albuterol (PROVENTIL HFA;VENTOLIN HFA) 108 (90 BASE) MCG/ACT inhaler Inhale 2 puffs into the lungs every 6 (six) hours as needed for wheezing or shortness of breath.     Marland Kitchen aspirin EC 81 MG tablet Take 81 mg by mouth daily.    . cetirizine (ZYRTEC) 10 MG tablet Take 10 mg by mouth at bedtime.     . cloNIDine (CATAPRES) 0.1 MG tablet Take 0.1 mg by mouth 2 (two) times daily.     Marland Kitchen diltiazem (CARDIZEM CD) 180 MG 24 hr capsule Take 180 mg by mouth daily.    . ferrous sulfate 325 (65 FE) MG tablet Take  325 mg by mouth 3 (three) times daily with meals.    . Fluticasone-Salmeterol (ADVAIR) 100-50 MCG/DOSE AEPB Inhale 2 puffs into the lungs 2 (two) times daily as needed (wheezing).     . furosemide (LASIX) 20 MG tablet Take 20 mg by mouth daily.    Marland Kitchen levETIRAcetam (KEPPRA) 500 MG tablet Take 1 tablet (500 mg total) by mouth 2 (two) times daily. 180 tablet 3  . losartan-hydrochlorothiazide (HYZAAR) 100-12.5 MG tablet Take 1 tablet by mouth daily.    . Multiple  Vitamins-Minerals (CENTRUM SILVER ADULT 50+ PO) Take 1 tablet by mouth daily.    . nebivolol (BYSTOLIC) 10 MG tablet Take 10 mg by mouth every other day. Take at bedtime    . PARoxetine (PAXIL) 20 MG tablet Take 20 mg by mouth daily.    . polyethylene glycol (MIRALAX / GLYCOLAX) packet Take 17 g by mouth daily as needed for mild constipation.    . potassium chloride SA (K-DUR,KLOR-CON) 20 MEQ tablet Take 20 mEq by mouth daily.     Marland Kitchen gabapentin (NEURONTIN) 300 MG capsule Take 300 mg by mouth 3 (three) times daily.     No facility-administered medications prior to visit.    PAST MEDICAL HISTORY: Past Medical History  Diagnosis Date  . 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 Hypertension   . Colon cancer ([place])   . Asthma   . DVT (deep venous thrombosis) ([facility])     right jugular  . Anemia   . Arthritis   . Clotting disorder ([place])   . Seizures ([place])   . PONV (postoperative nausea and vomiting)   . Spinal headache   . Shingles rash     PAST SURGICAL HISTORY: Past Surgical History  Procedure Laterality Date  . Abdominal hysterectomy  2012  . Small intestine surgery    . Colonoscopy    . Portacath placement    . Colon surgery    . Radiofrequency ablation liver tumor      FAMILY HISTORY: Family History  Problem Relation Age of Onset  . Hypertension Father   . Cancer Brother     colon  . Cancer Daughter     colon  . Breast cancer Sister     SOCIAL HISTORY: Social History   Social History  . Marital Status: Divorced    Spouse Name: N/A  . Number of Children: 3  . Years of Education: 12+   Occupational History  . Retired    Social History Main Topics  . Smoking status: Never Smoker   . Smokeless tobacco: Never Used  . Alcohol Use: No  . Drug Use: No  . Sexual Activity: No   Other Topics Concern  . Not on file   Social History Narrative   Patient lives at home alone.    Patient is divorced.    Patient has 3 children.    Patient has  some college.    Patient is retired.      PHYSICAL EXAM  Filed Vitals:   06/05/15 1503  BP: 147/87  Pulse: 72  Height: '5\' 2"'$  (1.575 m)  Weight: 169 lb (76.658 kg)   Body mass index is 30.9 kg/(m^2).   PHYSICAL EXAMNIATION:  Gen: NAD, conversant, well nourised, obese, well groomed                     Cardiovascular: Regular rate rhythm, no peripheral edema, warm, nontender. Eyes: Conjunctivae clear without  exudates or hemorrhage Neck: Supple, no carotid bruise. Pulmonary: Clear to auscultation bilaterally   NEUROLOGICAL EXAM:  MENTAL STATUS: Tired-looking elderly female Speech:    Speech is normal; fluent and spontaneous with normal comprehension.  Cognition:    The patient is oriented to person, place, and time;     recent and remote memory intact;     language fluent;     normal attention, concentration,     fund of knowledge.  CRANIAL NERVES: CN II: Visual fields are full to confrontation. Fundoscopic exam is normal with sharp discs and no vascular changes. Venous pulsations are present bilaterally. Pupils are 4 mm and briskly reactive to light.  CN III, IV, VI: extraocular movement are normal. No ptosis. CN V: Facial sensation is intact to pinprick in all 3 divisions bilaterally. Corneal responses are intact.  CN VII: Face is symmetric with normal eye closure and smile. CN VIII: Hearing is normal to rubbing fingers CN IX, X: Palate elevates symmetrically. Phonation is normal. CN XI: Head turning and shoulder shrug are intact CN XII: Tongue is midline with normal movements and no atrophy.  MOTOR: She has normal muscle bulk and tone are normal. She had mild fixation of left upper extremity upon rapid rotating movement  REFLEXES: Reflexes are 2+ and symmetric at the biceps, triceps, knees, and ankles. Plantar responses are flexor.  SENSORY: Light touch, pinprick, position sense, and vibration sense are intact in fingers and toes.  COORDINATION: Rapid  alternating movements and fine finger movements are intact. There is no dysmetria on finger-to-nose and heel-knee-shin. There are no abnormal or extraneous movements.   GAIT/STANCE: Posture is normal. Gait is steady with normal steps, base, arm swing, and turning. Romberg is absent.   DIAGNOSTIC DATA (LABS, IMAGING, TESTING) - I reviewed patient records, labs, notes, testing and imaging myself where available.  Lab Results  Component Value Date   WBC 3.6* 05/05/2015   HGB 11.0* 05/05/2015   HCT 34.4* 05/05/2015   MCV 95.6 05/05/2015   PLT 155 05/05/2015      Component Value Date/Time   NA 139 05/05/2015 0308   NA 141 07/02/2014 0912   NA 142 06/10/2011 1152   K 4.1 05/05/2015 0308   K 3.5 07/02/2014 0912   K 4.4 06/10/2011 1152   CL 108 05/05/2015 0308   CL 106 06/19/2012 0936   CL 96* 06/10/2011 1152   CO2 26 05/05/2015 0308   CO2 28 07/02/2014 0912   CO2 29 06/10/2011 1152   GLUCOSE 111* 05/05/2015 0308   GLUCOSE 92 07/02/2014 0912   GLUCOSE 93 06/19/2012 0936   GLUCOSE 88 06/10/2011 1152   BUN 13 05/05/2015 0308   BUN 17.4 07/02/2014 0912   BUN 12 06/10/2011 1152   CREATININE 0.83 05/05/2015 0308   CREATININE 0.9 07/02/2014 0912   CREATININE 0.7 06/10/2011 1152   CALCIUM 8.4* 05/05/2015 0308   CALCIUM 8.6 07/02/2014 0912   CALCIUM 8.8 06/10/2011 1152   PROT 6.1* 05/05/2015 0308   PROT 6.6 07/02/2014 0912   PROT 7.5 06/10/2011 1152   ALBUMIN 2.7* 05/05/2015 0308   ALBUMIN 3.1* 07/02/2014 0912   ALBUMIN 3.2* 06/10/2011 1152   AST 23 05/05/2015 0308   AST 19 07/02/2014 0912   AST 23 06/10/2011 1152   ALT 13* 05/05/2015 0308   ALT 12 07/02/2014 0912   ALT 21 06/10/2011 1152   ALKPHOS 47 05/05/2015 0308   ALKPHOS 49 07/02/2014 0912   ALKPHOS 53 06/10/2011 1152   BILITOT 0.3  05/05/2015 0308   BILITOT 0.43 07/02/2014 0912   BILITOT 0.50 06/10/2011 1152   GFRNONAA >60 05/05/2015 0308   GFRAA >60 05/05/2015 0308    ASSESSMENT AND PLAN  70 y.o. year old  female   History of metastatic colon cancer to liver (dx'd 02/25/09) Seizures  Her first seizure seizure was in May 27, 2010. Recurrent seizure in May 04 2015  Complete evaluation with MRI of the brain with and without contrast  EEG  Continue Keppra 500 twice a day    Marcial Pacas, M.D. Ph.D.  [facility] [place], [place] [address] Phone: [phone] Fax:      [phone]

## 2015-06-12 ENCOUNTER — Ambulatory Visit: Payer: Self-pay | Admitting: Neurology

## 2015-06-19 ENCOUNTER — Ambulatory Visit
Admission: RE | Admit: 2015-06-19 | Discharge: 2015-06-19 | Disposition: A | Discharge status: Home / Self Care | Payer: Medicare Other | Source: Ambulatory Visit | Attending: Neurology | Admitting: Neurology

## 2015-06-19 DIAGNOSIS — R569 Unspecified convulsions: Secondary | ICD-10-CM

## 2015-06-19 DIAGNOSIS — R269 Unspecified abnormalities of gait and mobility: Secondary | ICD-10-CM | POA: Diagnosis not present

## 2015-06-19 MED ORDER — GADOBENATE DIMEGLUMINE 529 MG/ML IV SOLN
16.0000 mL | Freq: Once | INTRAVENOUS | Status: AC | PRN
  Administered 2015-06-19: 16 mL via INTRAVENOUS

## 2015-06-23 ENCOUNTER — Telehealth: Payer: Self-pay | Admitting: Neurology

## 2015-06-23 NOTE — Telephone Encounter (Signed)
Please call patient, MRI of the brain continue showed moderate supratentorium small vessel disease, mild generalized atrophy I will go over MRI findings at her next follow-up visit,  IMPRESSION: Abnormal MRI scan of the brain showing moderate changes of chronic microvascular ischemia and mild degree of generalized cerebral atrophy. No enhancing lesions are noted. Compared with previous MRI scan dated 09/21/2013 the chronic microvascular ischemic changes appear to be more advanced

## 2015-06-23 NOTE — Telephone Encounter (Signed)
Spoke to patient - she is aware of MRI results and will keep her pending appointments.

## 2015-06-26 ENCOUNTER — Other Ambulatory Visit (HOSPITAL_BASED_OUTPATIENT_CLINIC_OR_DEPARTMENT_OTHER): Payer: Medicare Other

## 2015-06-26 DIAGNOSIS — Z85038 Personal history of other malignant neoplasm of large intestine: Secondary | ICD-10-CM

## 2015-06-26 DIAGNOSIS — C189 Malignant neoplasm of colon, unspecified: Secondary | ICD-10-CM

## 2015-06-26 LAB — CBC WITH DIFFERENTIAL/PLATELET
BASO%: 1 % (ref 0.0–2.0)
Basophils Absolute: 0 10*3/uL (ref 0.0–0.1)
EOS%: 1 % (ref 0.0–7.0)
Eosinophils Absolute: 0 10*3/uL (ref 0.0–0.5)
HEMATOCRIT: 37.9 % (ref 34.8–46.6)
HGB: 12.3 g/dL (ref 11.6–15.9)
LYMPH#: 0.7 10*3/uL — AB (ref 0.9–3.3)
LYMPH%: 28.1 % (ref 14.0–49.7)
MCH: 30.5 pg (ref 25.1–34.0)
MCHC: 32.5 g/dL (ref 31.5–36.0)
MCV: 94 fL (ref 79.5–101.0)
MONO#: 0.3 10*3/uL (ref 0.1–0.9)
MONO%: 14 % (ref 0.0–14.0)
NEUT%: 55.9 % (ref 38.4–76.8)
NEUTROS ABS: 1.4 10*3/uL — AB (ref 1.5–6.5)
PLATELETS: 136 10*3/uL — AB (ref 145–400)
RBC: 4.03 10*6/uL (ref 3.70–5.45)
RDW: 12.9 % (ref 11.2–14.5)
WBC: 2.4 10*3/uL — AB (ref 3.9–10.3)

## 2015-06-26 LAB — COMPREHENSIVE METABOLIC PANEL
ALT: 16 U/L (ref 0–55)
ANION GAP: 3 meq/L (ref 3–11)
AST: 23 U/L (ref 5–34)
Albumin: 3.1 g/dL — ABNORMAL LOW (ref 3.5–5.0)
Alkaline Phosphatase: 56 U/L (ref 40–150)
BILIRUBIN TOTAL: 0.43 mg/dL (ref 0.20–1.20)
BUN: 16.7 mg/dL (ref 7.0–26.0)
CALCIUM: 8.8 mg/dL (ref 8.4–10.4)
CHLORIDE: 110 meq/L — AB (ref 98–109)
CO2: 29 meq/L (ref 22–29)
CREATININE: 0.9 mg/dL (ref 0.6–1.1)
EGFR: 72 mL/min/{1.73_m2} — ABNORMAL LOW (ref >90)
Glucose: 96 mg/dl (ref 70–140)
Potassium: 3.7 mEq/L (ref 3.5–5.1)
Sodium: 142 mEq/L (ref 136–145)
TOTAL PROTEIN: 6.9 g/dL (ref 6.4–8.3)

## 2015-06-27 LAB — CEA (PARALLEL TESTING): CEA: <0.5 ng/mL

## 2015-06-27 LAB — CEA: CEA: 0.9 ng/mL (ref 0.0–4.7)

## 2015-06-30 ENCOUNTER — Ambulatory Visit (INDEPENDENT_AMBULATORY_CARE_PROVIDER_SITE_OTHER): Payer: Medicare Other | Admitting: Neurology

## 2015-06-30 DIAGNOSIS — R569 Unspecified convulsions: Secondary | ICD-10-CM | POA: Diagnosis not present

## 2015-07-03 ENCOUNTER — Ambulatory Visit (HOSPITAL_COMMUNITY)
Admission: RE | Admit: 2015-07-03 | Discharge: 2015-07-03 | Disposition: A | Discharge status: Home / Self Care | Payer: Medicare Other | Source: Ambulatory Visit | Attending: Oncology | Admitting: Oncology

## 2015-07-03 ENCOUNTER — Ambulatory Visit (HOSPITAL_BASED_OUTPATIENT_CLINIC_OR_DEPARTMENT_OTHER): Payer: Medicare Other | Admitting: Oncology

## 2015-07-03 ENCOUNTER — Telehealth: Payer: Self-pay | Admitting: Oncology

## 2015-07-03 ENCOUNTER — Encounter (HOSPITAL_COMMUNITY): Payer: Self-pay

## 2015-07-03 VITALS — BP 165/69 | HR 64 | Temp 97.9°F | Resp 18 | Ht 62.0 in | Wt 177.5 lb

## 2015-07-03 DIAGNOSIS — C787 Secondary malignant neoplasm of liver and intrahepatic bile duct: Secondary | ICD-10-CM | POA: Diagnosis not present

## 2015-07-03 DIAGNOSIS — K769 Liver disease, unspecified: Secondary | ICD-10-CM | POA: Diagnosis not present

## 2015-07-03 DIAGNOSIS — Z90710 Acquired absence of both cervix and uterus: Secondary | ICD-10-CM | POA: Insufficient documentation

## 2015-07-03 DIAGNOSIS — C189 Malignant neoplasm of colon, unspecified: Secondary | ICD-10-CM | POA: Insufficient documentation

## 2015-07-03 DIAGNOSIS — Z86718 Personal history of other venous thrombosis and embolism: Secondary | ICD-10-CM | POA: Diagnosis not present

## 2015-07-03 DIAGNOSIS — D1803 Hemangioma of intra-abdominal structures: Secondary | ICD-10-CM | POA: Insufficient documentation

## 2015-07-03 MED ORDER — IOPAMIDOL (ISOVUE-300) INJECTION 61%
100.0000 mL | Freq: Once | INTRAVENOUS | Status: AC | PRN
  Administered 2015-07-03: 100 mL via INTRAVENOUS

## 2015-07-03 NOTE — Telephone Encounter (Signed)
per pof to sch pt appt-gave pt copy of avs °

## 2015-07-03 NOTE — Progress Notes (Signed)
Hematology and Oncology Follow Up Visit  [name] [id number] Mar 29, 1946 70 y.o. 07/03/2015 8:50 AM   Principle Diagnosis: 70 year old woman with stage IV colon cancer with a history of an isolated metastasis to the liver diagnosed in 04/2009 She is currently NED.   Prior Therapy:  She is status post colectomy as well as radiofrequency ablation to an isolated liver met on 05/05/2009 at [facility].  She is status post chemotherapy with FOLFOX 6 and Avastin from 06/08/2009 through 12/19/2009 requiring dose reduction for thrombocytopenia and leukopenia. CT scan findings were retroperitoneal and anterior adenopathy status post CT guided core biopsy on 03/15/2011 showed no evidence  of malignancy except for fibrosis. 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   Current therapy: Observation and follow up.   Interim History: [name] presents today for a follow up visit. Since her last visit, she reports feeling reasonably well. She was diagnosed with shingles last year and developed post herpetic neuralgia. She reported symptoms of pain in her flank do to that which is also resolving. She is reporting less pain and very infrequent discomfort around that area.  She report no GI symptoms, no bleeding or weight loss. Her energy levels stable. She has no nausea, vomiting or abdominal pain. She has had no changes of bowel function. She is still very functional and lives independently. She is not able to drive but attends to all other activities of daily living.   She has not reported any bleeding problems nor any other constitutional symptoms. She does not report any headaches or blurry vision or syncope. She has not reported any chest pain or palpitations. She does not report any cough, wheezing or hemoptysis. She does not report any skeletal complaints without any arthralgias or myalgias. She is not on any lymphadenopathy or petechiae. She does  not report any frequency urgency or hesitancy. Rest of her review of systems unremarkable.   Medications: I have reviewed the patient's current medications. Current Outpatient Prescriptions  Medication Sig Dispense Refill  . albuterol (PROVENTIL HFA;VENTOLIN HFA) 108 (90 BASE) MCG/ACT inhaler Inhale 2 puffs into the lungs every 6 (six) hours as needed for wheezing or shortness of breath.     Marland Kitchen aspirin EC 81 MG tablet Take 81 mg by mouth daily.    . cetirizine (ZYRTEC) 10 MG tablet Take 10 mg by mouth at bedtime.     . cloNIDine (CATAPRES) 0.1 MG tablet Take 0.1 mg by mouth 2 (two) times daily.     Marland Kitchen diltiazem (CARDIZEM CD) 180 MG 24 hr capsule Take 180 mg by mouth daily.    . ferrous sulfate 325 (65 FE) MG tablet Take 325 mg by mouth 3 (three) times daily with meals.    . Fluticasone-Salmeterol (ADVAIR) 100-50 MCG/DOSE AEPB Inhale 2 puffs into the lungs 2 (two) times daily as needed (wheezing).     . furosemide (LASIX) 20 MG tablet Take 20 mg by mouth daily.    Marland Kitchen gabapentin (NEURONTIN) 600 MG tablet Take 600 mg by mouth 3 (three) times daily.  3  . levETIRAcetam (KEPPRA) 500 MG tablet Take 1 tablet (500 mg total) by mouth 2 (two) times daily. 180 tablet 3  . losartan-hydrochlorothiazide (HYZAAR) 100-12.5 MG tablet Take 1 tablet by mouth daily.    . meloxicam (MOBIC) 15 MG tablet Take 15 mg by mouth daily.  1  . Multiple Vitamins-Minerals (CENTRUM SILVER ADULT 50+ PO) Take 1 tablet by mouth daily.    Marland Kitchen  PARoxetine (PAXIL) 20 MG tablet Take 20 mg by mouth daily.    . polyethylene glycol (MIRALAX / GLYCOLAX) packet Take 17 g by mouth daily as needed for mild constipation.    . potassium chloride SA (K-DUR,KLOR-CON) 20 MEQ tablet Take 20 mEq by mouth daily.     . traZODone (DESYREL) 50 MG tablet Take 50 mg by mouth at bedtime.  1   No current facility-administered medications for this visit.    Allergies: No Known Allergies  Past Medical History, Surgical history, Social history, and Family  History were reviewed and updated.   Physical Exam: Blood pressure 165/69, pulse 64, temperature 97.9 F (36.6 C), temperature source Oral, resp. rate 18, height '5\' 2"'  (1.575 m), weight 177 lb 8 oz (80.513 kg), SpO2 98 %. ECOG: 1 General appearance: alert comfortable-appearing woman without distress. Head: Normocephalic, without obvious abnormality Neck: no adenopathy Lymph nodes: Cervical, supraclavicular, and axillary nodes normal. Heart:regular rate and rhythm, S1, S2 normal, no murmur, click, rub or gallop Lung:chest clear, no wheezing, rales, normal symmetric air entry Abdomin: soft, non-tender, without masses or organomegaly no shifting dullness or ascites. EXT:no erythema, induration, or nodules Skin: No skin rashes or lesions.  Lab Results: Lab Results  Component Value Date   WBC 2.4* 06/26/2015   HGB 12.3 06/26/2015   HCT 37.9 06/26/2015   MCV 94.0 06/26/2015   PLT 136* 06/26/2015     Chemistry      Component Value Date/Time   NA 142 06/26/2015 0849   NA 139 05/05/2015 0308   NA 142 06/10/2011 1152   K 3.7 06/26/2015 0849   K 4.1 05/05/2015 0308   K 4.4 06/10/2011 1152   CL 108 05/05/2015 0308   CL 106 06/19/2012 0936   CL 96* 06/10/2011 1152   CO2 29 06/26/2015 0849   CO2 26 05/05/2015 0308   CO2 29 06/10/2011 1152   BUN 16.7 06/26/2015 0849   BUN 13 05/05/2015 0308   BUN 12 06/10/2011 1152   CREATININE 0.9 06/26/2015 0849   CREATININE 0.83 05/05/2015 0308   CREATININE 0.7 06/10/2011 1152      Component Value Date/Time   CALCIUM 8.8 06/26/2015 0849   CALCIUM 8.4* 05/05/2015 0308   CALCIUM 8.8 06/10/2011 1152   ALKPHOS 56 06/26/2015 0849   ALKPHOS 47 05/05/2015 0308   ALKPHOS 53 06/10/2011 1152   AST 23 06/26/2015 0849   AST 23 05/05/2015 0308   AST 23 06/10/2011 1152   ALT 16 06/26/2015 0849   ALT 13* 05/05/2015 0308   ALT 21 06/10/2011 1152   BILITOT 0.43 06/26/2015 0849   BILITOT 0.3 05/05/2015 0308   BILITOT 0.50 06/10/2011 1152       Current Outpatient Prescriptions  Medication Sig Dispense Refill  . albuterol (PROVENTIL HFA;VENTOLIN HFA) 108 (90 BASE) MCG/ACT inhaler Inhale 2 puffs into the lungs every 6 (six) hours as needed for wheezing or shortness of breath.     Marland Kitchen aspirin EC 81 MG tablet Take 81 mg by mouth daily.    . cetirizine (ZYRTEC) 10 MG tablet Take 10 mg by mouth at bedtime.     . cloNIDine (CATAPRES) 0.1 MG tablet Take 0.1 mg by mouth 2 (two) times daily.     Marland Kitchen diltiazem (CARDIZEM CD) 180 MG 24 hr capsule Take 180 mg by mouth daily.    . ferrous sulfate 325 (65 FE) MG tablet Take 325 mg by mouth 3 (three) times daily with meals.    . Fluticasone-Salmeterol (ADVAIR) 100-50  MCG/DOSE AEPB Inhale 2 puffs into the lungs 2 (two) times daily as needed (wheezing).     . furosemide (LASIX) 20 MG tablet Take 20 mg by mouth daily.    Marland Kitchen gabapentin (NEURONTIN) 600 MG tablet Take 600 mg by mouth 3 (three) times daily.  3  . levETIRAcetam (KEPPRA) 500 MG tablet Take 1 tablet (500 mg total) by mouth 2 (two) times daily. 180 tablet 3  . losartan-hydrochlorothiazide (HYZAAR) 100-12.5 MG tablet Take 1 tablet by mouth daily.    . meloxicam (MOBIC) 15 MG tablet Take 15 mg by mouth daily.  1  . Multiple Vitamins-Minerals (CENTRUM SILVER ADULT 50+ PO) Take 1 tablet by mouth daily.    Marland Kitchen PARoxetine (PAXIL) 20 MG tablet Take 20 mg by mouth daily.    . polyethylene glycol (MIRALAX / GLYCOLAX) packet Take 17 g by mouth daily as needed for mild constipation.    . potassium chloride SA (K-DUR,KLOR-CON) 20 MEQ tablet Take 20 mEq by mouth daily.     . traZODone (DESYREL) 50 MG tablet Take 50 mg by mouth at bedtime.  1   No current facility-administered medications for this visit.    EXAM: CT CHEST, ABDOMEN, AND PELVIS WITH CONTRAST  TECHNIQUE: Multidetector CT imaging of the chest, abdomen and pelvis was performed following the standard protocol during bolus administration of intravenous contrast.  CONTRAST: 133m  ISOVUE-300 IOPAMIDOL (ISOVUE-300) INJECTION 61%  COMPARISON: 07/02/2014  FINDINGS: CT CHEST  Mediastinum: Normal heart size. There is no pericardial effusion identified. The trachea appears patent and is midline. Normal appearance of the thyroid gland. There is a a right hilar lymph node which measures 11 mm, image 28 of series 2. Previously 1.3 cm. Subcarinal lymph node measures 1.2 cm, image 27 of series 2. Unchanged from previous exam.  Lungs/Pleura: No pleural fluid. Pleural and parenchymal scarring and thickening is noted within both lung bases. Bronchial wall thickening and bronchiectasis is also noted within the right middle lobe and both lower lobes. Mild paraseptal emphysema. Calcified granuloma is identified in the right upper lobe.  Musculoskeletal: Thoracic spine hemangiomas are again noted. No aggressive lytic or sclerotic bone lesions identified. No aggressive lytic or sclerotic bone lesions identified.  CT ABDOMEN AND PELVIS  Hepatobiliary: Large cavernous hemangioma involving the right lobe of liver is again noted. This is unchanged from previous exam. 7 mm low density structure along the dome of liver is unchanged, image 42 of series 2. The radio frequency ablation defect within the left lobe of liver measures 2.8 cm 2.8 by 2.2 cm, image 48 of series 2. Previously 2.9 by 2.3 cm. The gallbladder is normal. No biliary dilatation.  Pancreas: Normal appearance of the pancreas.  Spleen: Negative  Adrenals/Urinary Tract: The adrenal glands are normal. Unremarkable appearance of both kidneys. The urinary bladder is normal. The urinary bladder is normal.  Stomach/Bowel: The stomach and the small bowel loops have a normal caliber. No dilated loops of small bowel. There is no pathologic dilatation of the colon.  Vascular/Lymphatic: Normal appearance of the abdominal aorta. No enlarged retroperitoneal or mesenteric adenopathy. No enlarged pelvic or  inguinal lymph nodes.  Reproductive: Previous hysterectomy. No adnexal mass.  Other: No free fluid or fluid collections identified within the abdomen or pelvis. There is a ventral abdominal wall hernia which contains a loop of nonobstructed small bowel, unchanged from previous exam, image 73 of series 2.  Musculoskeletal: No aggressive lytic or sclerotic bone lesions.  IMPRESSION: 1. Stable examination. No findings to suggest  recurrent tumor or metastatic disease. 2. Decrease in size of treated lesion within left lobe of liver. No new metastatic lesions identified. 3. Similar appearance of large right lobe of liver hemangioma.      Impression and Plan:  70 year old woman with:  1. Stage IV colon cancer with a history of an isolated metastasis to the liver. Status post colectomy as well as radiofrequency ablation to an isolated liver met on 05/05/2009 at [facility]. Status post chemotherapy with FOLFOX 6 and Avastin from 06/08/2009 through 12/19/2009.  Her CT scan from 07/03/2015 was  discussed today and showed no evidence of disease recurrence.   The plan is to continue with active surveillance and repeat imaging studies as needed. She will return in 12 months for laboratory testing and physical examination.  2. 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 Her port has been removed on 06/2011.   3. Leukocytopenia: This has been chronic in nature and have not dramatically changed.   4. Herpes zoster: Her post herpetic neuralgia mostly have resolved.  5. Followup: Will be in 12 months.       [facility], MD 3/30/20178:50 AM

## 2015-07-03 NOTE — Procedures (Signed)
   HISTORY: 70 years old female, initial seizure in 2012, has recurrent nocturnal seizure again in May 04 2015  TECHNIQUE:  16 channel EEG was performed based on standard 10-16 international system. One channel was dedicated to EKG, which has demonstrates normal sinus rhythm of 60 beats per minutes.  Upon awakening, the posterior background activity was well-developed, in alpha range, 9 Hz, reactive to eye opening and closure.  There was no evidence of epileptiform discharge.  Photic stimulation was performed, which induced a symmetric photic driving.  Hyperventilation was performed, there was no abnormality elicit.  Stage II sleep was achieved.  CONCLUSION: This is a  normal awake and asleep EEG.  There is no electrodiagnostic evidence of epileptiform discharge

## 2015-07-03 NOTE — Telephone Encounter (Signed)
Gave and printed appt sched and avs for pt for March 2018

## 2015-08-05 ENCOUNTER — Ambulatory Visit (INDEPENDENT_AMBULATORY_CARE_PROVIDER_SITE_OTHER): Payer: Medicare Other | Admitting: Neurology

## 2015-08-05 ENCOUNTER — Encounter: Payer: Self-pay | Admitting: Neurology

## 2015-08-05 VITALS — BP 128/80 | HR 60 | Ht 62.0 in | Wt 177.0 lb

## 2015-08-05 DIAGNOSIS — R569 Unspecified convulsions: Secondary | ICD-10-CM

## 2015-08-05 DIAGNOSIS — C189 Malignant neoplasm of colon, unspecified: Secondary | ICD-10-CM

## 2015-08-05 NOTE — Progress Notes (Signed)
Chief Complaint  Patient presents with  . Seizures    She is here to review her EEG and MRI results.  Reports no further seizure activity.    Chief Complaint  Patient presents with  . Seizures    She is here to review her EEG and MRI results.  Reports no further seizure activity.     [facility]  PATIENT: [name] DOB: October 29, 1945   HISTORY OF PRESENT ILLNESS:  [name], is a 70 years old right-handed female, follow-up for seizure, she only has one seizure in February second 2012   She has past medical history of hypertension, colon cancer in 2011, status post surgery, chemotherapy, she also underwent hysterectomy in February 2012 for uterus prolapse, she presented with seizure in May 27, 2010.  She was discharged from nursing home after her hysterectomy due to the needs for prolonged antibiotic infusion,she is not sure the name of the medications.  in May 27, 2010 while at nursing home, walking in her room in the early afternoon, without warning signs, she had one seizure, was taken to the emergency room. She had no recollection of what has happened, wake up in the emergency room confused. CAT scan without contrast of brain was normal. Laboratory evaluation has demonstrate decreased white count 2.6, anemia hemoglobin 9.3, hematocrit 29.7, UA showed UTI, moderate esterase, she apparently was started on Keppra 250 mg b.i.d. 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 EEG normal.   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, no gait  difficulty, lives alone, still driving,  UPDATE Feb 2nd 2017:  She had one nocturnal seizure in May 04 2015 during sleep, witnessed by her daughter, tonic-clonic seizure for a few minutes followed by post event confusion, with tongue biting, was taken to the emergency room, I have reviewed laboratory evaluation, evidence of UTI, low albumin of 2.7, mild anemia 11, she is now taking Keppra 500 mg twice a day, tolerating it well  UPDATE June 05 2015: Since last visit in Feb 2nd 2017, she fell twice, her left leg gave out underneath her, she had no confusion, no seizure-like activities,  She has not had MRI and EEG, she is able to tolerating Keppra 500 mg twice a day well   UPDATE Aug 05 2015:  She has no recurrent seizure since last visit, tolerating Keppra 500 mg twice a day We have personally reviewed MRI of the brain with without contrast in March 2017: moderate changes of chronic microvascular ischemia and mild degree of generalized cerebral atrophy. No enhancing lesions are noted. Compared with previous MRI scan dated 09/21/2013 the chronic microvascular ischemic changes appear to be more advanced  CT abdomen chest, pelvic with contrast in March 2017:1. Stable examination. No findings to suggest recurrent tumor or metastatic disease. Decrease in size of treated lesion within left lobe of liver. No new metastatic lesions identified. EEG was normal in March 2017  Today she reported a history of childhood generalized seizure from age 52 to age 71, but she was never treated with any antiepileptic medications.  REVIEW OF SYSTEMS: Full 14 system review of systems performed and notable  only for those listed, all others are neg: Dizziness, seizure,   ALLERGIES: No Known Allergies  HOME MEDICATIONS: Outpatient Prescriptions Prior to Visit  Medication Sig Dispense Refill  . albuterol (PROVENTIL HFA;VENTOLIN HFA) 108 (90 BASE) MCG/ACT inhaler Inhale 2 puffs into the lungs every 6 (six) hours as  needed for wheezing or shortness of breath.     Marland Kitchen aspirin EC 81 MG tablet Take 81 mg by mouth daily.    . cetirizine (ZYRTEC) 10 MG tablet Take 10 mg by mouth at bedtime.     . cloNIDine (CATAPRES) 0.1 MG tablet Take 0.1 mg by mouth 2 (two) times daily.     Marland Kitchen diltiazem (CARDIZEM CD) 180 MG 24 hr capsule Take 180 mg by mouth daily.    . ferrous sulfate 325 (65 FE) MG tablet Take 325 mg by mouth 3 (three) times daily with meals.    . Fluticasone-Salmeterol (ADVAIR) 100-50 MCG/DOSE AEPB Inhale 2 puffs into the lungs 2 (two) times daily as needed (wheezing).     . furosemide (LASIX) 20 MG tablet Take 20 mg by mouth daily.    Marland Kitchen gabapentin (NEURONTIN) 600 MG tablet Take 600 mg by mouth 3 (three) times daily.  3  . levETIRAcetam (KEPPRA) 500 MG tablet Take 1 tablet (500 mg total) by mouth 2 (two) times daily. 180 tablet 3  . losartan-hydrochlorothiazide (HYZAAR) 100-12.5 MG tablet Take 1 tablet by mouth daily.    . meloxicam (MOBIC) 15 MG tablet Take 15 mg by mouth daily.  1  . Multiple Vitamins-Minerals (CENTRUM SILVER ADULT 50+ PO) Take 1 tablet by mouth daily.    Marland Kitchen PARoxetine (PAXIL) 20 MG tablet Take 20 mg by mouth daily.    . polyethylene glycol (MIRALAX / GLYCOLAX) packet Take 17 g by mouth daily as needed for mild constipation.    . potassium chloride SA (K-DUR,KLOR-CON) 20 MEQ tablet Take 20 mEq by mouth daily.     . traZODone (DESYREL) 50 MG tablet Take 50 mg by mouth at bedtime.  1   No facility-administered medications prior to visit.    PAST MEDICAL HISTORY: Past Medical History  Diagnosis Date  . 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 Hypertension   . Colon cancer ([place])   . Asthma   . DVT (deep venous thrombosis) ([facility])     right jugular  . Anemia   . Arthritis   . Clotting disorder ([place])   . Seizures ([place])   . PONV (postoperative nausea and vomiting)   . Spinal headache   . Shingles rash     PAST SURGICAL HISTORY: Past Surgical History  Procedure  Laterality Date  . Abdominal hysterectomy  2012  . Small intestine surgery    . Colonoscopy    . Portacath placement    . Colon surgery    . Radiofrequency ablation liver tumor      FAMILY HISTORY: Family History  Problem Relation Age of Onset  . Hypertension Father   . Cancer Brother     colon  . Cancer Daughter     colon  . Breast cancer Sister     SOCIAL HISTORY: Social History   Social History  . Marital Status: Divorced    Spouse Name: N/A  . Number of Children: 3  . Years of Education: 12+   Occupational History  . Retired    Social History Main Topics  . Smoking status: Never Smoker   . Smokeless tobacco:  Never Used  . Alcohol Use: No  . Drug Use: No  . Sexual Activity: No   Other Topics Concern  . Not on file   Social History Narrative   Patient lives at home alone.    Patient is divorced.    Patient has 3 children.    Patient has some college.    Patient is retired.      PHYSICAL EXAM  Filed Vitals:   08/05/15 1410  BP: 128/80  Pulse: 60  Height: 5\' 2"  (1.575 m)  Weight: 177 lb (80.287 kg)   Body mass index is 32.37 kg/(m^2).   PHYSICAL EXAMNIATION:  Gen: NAD, conversant, well nourised, obese, well groomed                     Cardiovascular: Regular rate rhythm, no peripheral edema, warm, nontender. Eyes: Conjunctivae clear without exudates or hemorrhage Neck: Supple, no carotid bruise. Pulmonary: Clear to auscultation bilaterally   NEUROLOGICAL EXAM:  MENTAL STATUS: Tired-looking elderly female Speech:    Speech is normal; fluent and spontaneous with normal comprehension.  Cognition:    The patient is oriented to person, place, and time;     recent and remote memory intact;     language fluent;     normal attention, concentration,     fund of knowledge.  CRANIAL NERVES: CN II: Visual fields are full to confrontation. Fundoscopic exam is normal with sharp discs and no vascular changes. Venous pulsations are present  bilaterally. Pupils are 4 mm and briskly reactive to light.  CN III, IV, VI: extraocular movement are normal. No ptosis. CN V: Facial sensation is intact to pinprick in all 3 divisions bilaterally. Corneal responses are intact.  CN VII: Face is symmetric with normal eye closure and smile. CN VIII: Hearing is normal to rubbing fingers CN IX, X: Palate elevates symmetrically. Phonation is normal. CN XI: Head turning and shoulder shrug are intact CN XII: Tongue is midline with normal movements and no atrophy.  MOTOR: She has normal muscle bulk and tone are normal. She had mild fixation of left upper extremity upon rapid rotating movement  REFLEXES: Reflexes are 2+ and symmetric at the biceps, triceps, knees, and ankles. Plantar responses are flexor.  SENSORY: Light touch, pinprick, position sense, and vibration sense are intact in fingers and toes.  COORDINATION: Rapid alternating movements and fine finger movements are intact. There is no dysmetria on finger-to-nose and heel-knee-shin. There are no abnormal or extraneous movements.   GAIT/STANCE: Posture is normal. Gait is steady with normal steps, base, arm swing, and turning. Romberg is absent.   DIAGNOSTIC DATA (LABS, IMAGING, TESTING) - I reviewed patient records, labs, notes, testing and imaging myself where available.  Lab Results  Component Value Date   WBC 2.4* 06/26/2015   HGB 12.3 06/26/2015   HCT 37.9 06/26/2015   MCV 94.0 06/26/2015   PLT 136* 06/26/2015      Component Value Date/Time   NA 142 06/26/2015 0849   NA 139 05/05/2015 0308   NA 142 06/10/2011 1152   K 3.7 06/26/2015 0849   K 4.1 05/05/2015 0308   K 4.4 06/10/2011 1152   CL 108 05/05/2015 0308   CL 106 06/19/2012 0936   CL 96* 06/10/2011 1152   CO2 29 06/26/2015 0849   CO2 26 05/05/2015 0308   CO2 29 06/10/2011 1152   GLUCOSE 96 06/26/2015 0849   GLUCOSE 111* 05/05/2015 0308   GLUCOSE 93 06/19/2012 0936   GLUCOSE  88 06/10/2011 1152   BUN 16.7  06/26/2015 0849   BUN 13 05/05/2015 0308   BUN 12 06/10/2011 1152   CREATININE 0.9 06/26/2015 0849   CREATININE 0.83 05/05/2015 0308   CREATININE 0.7 06/10/2011 1152   CALCIUM 8.8 06/26/2015 0849   CALCIUM 8.4* 05/05/2015 0308   CALCIUM 8.8 06/10/2011 1152   PROT 6.9 06/26/2015 0849   PROT 6.1* 05/05/2015 0308   PROT 7.5 06/10/2011 1152   ALBUMIN 3.1* 06/26/2015 0849   ALBUMIN 2.7* 05/05/2015 0308   ALBUMIN 3.2* 06/10/2011 1152   AST 23 06/26/2015 0849   AST 23 05/05/2015 0308   AST 23 06/10/2011 1152   ALT 16 06/26/2015 0849   ALT 13* 05/05/2015 0308   ALT 21 06/10/2011 1152   ALKPHOS 56 06/26/2015 0849   ALKPHOS 47 05/05/2015 0308   ALKPHOS 53 06/10/2011 1152   BILITOT 0.43 06/26/2015 0849   BILITOT 0.3 05/05/2015 0308   BILITOT 0.50 06/10/2011 1152   GFRNONAA >60 05/05/2015 0308   GFRAA >60 05/05/2015 0308    ASSESSMENT AND PLAN  70 y.o. year old female   History of metastatic colon cancer to liver (dx'd 02/25/09) Seizures  Childhood generalized seizure  Recurrent seizure again in 2012, recurrent seizure again May 04 2015  Normal EEG  MRI of the brain showed advanced periventricular white matter small vessel disease  No driving until seizure free for 6 months  Continue Keppra 500 mg twice a day, calcium and vitamin D supplements   Marcial Pacas, M.D. Ph.D.  [facility] [place], [place] [address] Phone: [phone] Fax:      [phone]

## 2016-02-10 ENCOUNTER — Ambulatory Visit (INDEPENDENT_AMBULATORY_CARE_PROVIDER_SITE_OTHER): Payer: Medicare Other | Admitting: Nurse Practitioner

## 2016-02-10 ENCOUNTER — Encounter: Payer: Self-pay | Admitting: Nurse Practitioner

## 2016-02-10 VITALS — BP 146/82 | HR 72 | Ht 62.0 in | Wt 173.8 lb

## 2016-02-10 DIAGNOSIS — R569 Unspecified convulsions: Secondary | ICD-10-CM | POA: Diagnosis not present

## 2016-02-10 DIAGNOSIS — G40309 Generalized idiopathic epilepsy and epileptic syndromes, not intractable, without status epilepticus: Secondary | ICD-10-CM

## 2016-02-10 NOTE — Patient Instructions (Signed)
Continue Keppra 500 twice a day for seizure disorder Continue calcium and vitamin D supplements Call for any seizure activity May resume driving Follow-up in 6 months

## 2016-02-10 NOTE — Progress Notes (Signed)
[facility]  PATIENT: [name] DOB: [birth year]   REASON FOR VISIT: Follow-up for seizure disorder HISTORY FROM: Patient    HISTORY OF PRESENT ILLNESSMs.[name], is a 69 years old right-handed female, follow-up for seizure, she only has one seizure in February second 2012   She has past medical history of hypertension, colon cancer in 2011, status post surgery, chemotherapy, she also underwent hysterectomy in February 2012 for uterus prolapse, she presented with seizure in May 27, 2010.  She was discharged from nursing home after her hysterectomy due to the needs for prolonged antibiotic infusion,she is not sure the name of the medications.  in May 27, 2010 while at nursing home, walking in her room in the early afternoon, without warning signs, she had one seizure, was taken to the emergency room. She had no recollection of what has happened, wake up in the emergency room confused. CAT scan without contrast of brain was normal. Laboratory evaluation has demonstrate decreased white count 2.6, anemia hemoglobin 9.3, hematocrit 29.7, UA showed UTI, moderate esterase, she apparently was started on Keppra 250 mg b.i.d. 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 EEG normal.   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, no gait difficulty, lives alone, still driving,  UPDATE Feb 2nd 2017:  She had one nocturnal seizure in May 04 2015 during sleep, witnessed by her daughter, tonic-clonic seizure for a few minutes followed by post event  confusion, with tongue biting, was taken to the emergency room, I have reviewed laboratory evaluation, evidence of UTI, low albumin of 2.7, mild anemia 11, she is now taking Keppra 500 mg twice a day, tolerating it well  UPDATE June 05 2015: Since last visit in Feb 2nd 2017, she fell twice, her left leg gave out underneath her, she had no confusion, no seizure-like activities,  She has not had MRI and EEG, she is able to tolerating Keppra 500 mg twice a day well   UPDATE Aug 05 2015:  She has no recurrent seizure since last visit, tolerating Keppra 500 mg twice a day We have personally reviewed MRI of the brain with without contrast in March 2017: moderate changes of chronic microvascular ischemia and mild degree of generalized cerebral atrophy. No enhancing lesions are noted. Compared with previous MRI scan dated 09/21/2013 the chronic microvascular ischemic changes appear to be more advanced  CT abdomen chest, pelvic with contrast in March 2017:1. Stable examination. No findings to suggest recurrent tumor or metastatic disease. Decrease in size of treated lesion within left lobe of liver. No new metastatic lesions identified. EEG was normal in March 2017  Today she reported a history of childhood generalized seizure from age 65 to age 89, but she was never treated with any antiepileptic medications.: UPDATE 11/07/2017[name], 70 year old female returns for follow-up. She has a history of generalized seizure disorder with last seizure occurring in January of this year. She is currently on Keppra 500 mg twice daily without further seizure events and side effects. EEG was normal in March 2017. MRI of the brain with and without contrast in March 2017 with moderate changes of chronic  microvascular ischemia and mild degree of generalized cerebral atrophy no enhancing lesions. The patient is wishing to return to drive. She remains independent in all activities of daily living. She returns for  reevaluation  REVIEW OF SYSTEMS: Full 14 system review of systems performed and notable only for those listed, all others are neg:  Constitutional: neg  Cardiovascular: neg Ear/Nose/Throat: neg  Skin: neg Eyes: Blurred vision Respiratory: neg Gastroitestinal: neg  Hematology/Lymphatic: neg  Endocrine: neg Musculoskeletal:neg Allergy/Immunology: neg Neurological: Dizziness Psychiatric: neg Sleep : neg   ALLERGIES: No Known Allergies  HOME MEDICATIONS: Outpatient Medications Prior to Visit  Medication Sig Dispense Refill  . albuterol (PROVENTIL HFA;VENTOLIN HFA) 108 (90 BASE) MCG/ACT inhaler Inhale 2 puffs into the lungs every 6 (six) hours as needed for wheezing or shortness of breath.     Marland Kitchen aspirin EC 81 MG tablet Take 81 mg by mouth daily.    . calcium-vitamin D (OSCAL WITH D) 500-200 MG-UNIT tablet Take 1 tablet by mouth.    . cetirizine (ZYRTEC) 10 MG tablet Take 10 mg by mouth at bedtime.     . cloNIDine (CATAPRES) 0.1 MG tablet Take 0.1 mg by mouth 2 (two) times daily.     Marland Kitchen diltiazem (CARDIZEM CD) 180 MG 24 hr capsule Take 180 mg by mouth daily.    . ferrous sulfate 325 (65 FE) MG tablet Take 325 mg by mouth 3 (three) times daily with meals.    . Fluticasone-Salmeterol (ADVAIR) 100-50 MCG/DOSE AEPB Inhale 2 puffs into the lungs 2 (two) times daily as needed (wheezing).     . furosemide (LASIX) 20 MG tablet Take 20 mg by mouth daily.    Marland Kitchen gabapentin (NEURONTIN) 600 MG tablet Take 600 mg by mouth 3 (three) times daily.  3  . levETIRAcetam (KEPPRA) 500 MG tablet Take 1 tablet (500 mg total) by mouth 2 (two) times daily. 180 tablet 3  . losartan-hydrochlorothiazide (HYZAAR) 100-12.5 MG tablet Take 1 tablet by mouth daily.    . meloxicam (MOBIC) 15 MG tablet Take 15 mg by mouth daily.  1  . Multiple Vitamins-Minerals (CENTRUM SILVER ADULT 50+ PO) Take 1 tablet by mouth daily.    Marland Kitchen PARoxetine (PAXIL) 20 MG tablet Take 20 mg by mouth daily.    . polyethylene glycol (MIRALAX /  GLYCOLAX) packet Take 17 g by mouth daily as needed for mild constipation.    . potassium chloride SA (K-DUR,KLOR-CON) 20 MEQ tablet Take 20 mEq by mouth daily.     . traZODone (DESYREL) 50 MG tablet Take 50 mg by mouth at bedtime.  1   No facility-administered medications prior to visit.     PAST MEDICAL HISTORY: Past Medical History:  Diagnosis Date  . Anemia   . Arthritis   . Asthma   . Clotting disorder ([place])   . Colon cancer ([place])   . DVT (deep venous thrombosis) ([facility])    right jugular  . Hypertension   . 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 PONV (postoperative nausea and vomiting)   . Seizures ([place])   . Shingles rash   . Spinal headache     PAST SURGICAL HISTORY: Past Surgical History:  Procedure Laterality Date  . ABDOMINAL HYSTERECTOMY  2012  . COLON SURGERY    . COLONOSCOPY    . PORTACATH PLACEMENT    . RADIOFREQUENCY ABLATION LIVER TUMOR    . SMALL INTESTINE SURGERY      FAMILY HISTORY: Family History  Problem Relation Age of Onset  . Hypertension Father   . Cancer Brother     colon  . Cancer Daughter     colon  . Breast cancer Sister     SOCIAL HISTORY: Social History   Social History  . Marital status: Divorced    Spouse name: N/A  . Number of children: 3  . Years of education: 12+   Occupational History  . Retired    Social History Main Topics  . Smoking status: Never Smoker  . Smokeless tobacco: Never Used  . Alcohol use No  . Drug use: No  . Sexual activity: No   Other Topics Concern  . Not on file   Social History Narrative   Patient lives at home alone.    Patient is divorced.    Patient has 3 children.    Patient has some college.    Patient is retired.      PHYSICAL EXAM  Vitals:   02/10/16 1504  BP: (!) 146/82  Pulse: 72  Weight: 173 lb 12.8 oz (78.8 kg)  Height: '5\' 2"'  (1.575 m)   Body mass index is 31.79 kg/m.  Generalized: Well developed, in no acute distress  Head: normocephalic  and atraumatic,. Oropharynx benign  Neck: Supple, no carotid bruits  Cardiac: Regular rate rhythm, no murmur  Musculoskeletal: No deformity   Neurological examination   Mentation: Alert oriented to time, place, history taking. Attention span and concentration appropriate. Recent and remote memory intact.  Follows all commands speech and language fluent.   Cranial nerve II-XII: Pupils were equal round reactive to light extraocular movements were full, visual field were full on confrontational test. Facial sensation and strength were normal. hearing was intact to finger rubbing bilaterally. Uvula tongue midline. head turning and shoulder shrug were normal and symmetric.Tongue protrusion into cheek strength was normal. Motor: normal bulk and tone, full strength in the BUE, BLE, fine finger movements normal, no pronator drift. No focal weakness Sensory: normal and symmetric to light touch, pinprick, and  Vibration, In the upper and lower extremities Coordination: finger-nose-finger, heel-to-shin bilaterally, no dysmetria Reflexes: Brachioradialis 2/2, biceps 2/2, triceps 2/2, patellar 2/2, Achilles 2/2, plantar responses were flexor bilaterally. Gait and Station: Rising up from seated position without assistance, normal stance,  moderate stride, good arm swing, smooth turning, able to perform tiptoe, and heel walking without difficulty. Tandem gait is steady  DIAGNOSTIC DATA (LABS, IMAGING, TESTING) - I reviewed patient records, labs, notes, testing and imaging myself where available.  Lab Results  Component Value Date   WBC 2.4 (L) 06/26/2015   HGB 12.3 06/26/2015   HCT 37.9 06/26/2015   MCV 94.0 06/26/2015   PLT 136 (L) 06/26/2015      Component Value Date/Time   NA 142 06/26/2015 0849   K 3.7 06/26/2015 0849   CL 108 05/05/2015 0308   CL 106 06/19/2012 0936   CO2 29 06/26/2015 0849   GLUCOSE 96 06/26/2015 0849   GLUCOSE 93 06/19/2012 0936   BUN 16.7 06/26/2015 0849   CREATININE  0.9 06/26/2015 0849   CALCIUM 8.8 06/26/2015 0849   PROT 6.9 06/26/2015 0849   ALBUMIN 3.1 (L) 06/26/2015 0849   AST 23 06/26/2015 0849   ALT 16 06/26/2015 0849   ALKPHOS 56 06/26/2015 0849   BILITOT 0.43 06/26/2015 0849   GFRNONAA >60 05/05/2015 0308   GFRAA >60 05/05/2015 0308    ASSESSMENT AND PLAN  70 y.o. 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    PLAN: Continue Keppra 500 twice a day for seizure disorder Continue calcium and vitamin D supplements Call for any seizure activity May resume driving Follow-up in 6 months Dennie Bible, [facility], [facility], APRN  [facility] [address], [place], [place] [address] [phone]

## 2016-02-12 ENCOUNTER — Encounter (HOSPITAL_COMMUNITY): Payer: Self-pay | Admitting: Emergency Medicine

## 2016-02-12 ENCOUNTER — Inpatient Hospital Stay (HOSPITAL_COMMUNITY)
Admission: EM | Admit: 2016-02-12 | Discharge: 2016-02-20 | DRG: 389 | Disposition: A | Discharge status: Home / Self Care | Payer: Medicare Other | Attending: General Surgery | Admitting: General Surgery

## 2016-02-12 DIAGNOSIS — C787 Secondary malignant neoplasm of liver and intrahepatic bile duct: Secondary | ICD-10-CM | POA: Diagnosis present

## 2016-02-12 DIAGNOSIS — Z803 Family history of malignant neoplasm of breast: Secondary | ICD-10-CM

## 2016-02-12 DIAGNOSIS — Z90710 Acquired absence of both cervix and uterus: Secondary | ICD-10-CM

## 2016-02-12 DIAGNOSIS — I10 Essential (primary) hypertension: Secondary | ICD-10-CM | POA: Diagnosis present

## 2016-02-12 DIAGNOSIS — Z9049 Acquired absence of other specified parts of digestive tract: Secondary | ICD-10-CM

## 2016-02-12 DIAGNOSIS — Z7982 Long term (current) use of aspirin: Secondary | ICD-10-CM

## 2016-02-12 DIAGNOSIS — Z8249 Family history of ischemic heart disease and other diseases of the circulatory system: Secondary | ICD-10-CM

## 2016-02-12 DIAGNOSIS — R079 Chest pain, unspecified: Secondary | ICD-10-CM

## 2016-02-12 DIAGNOSIS — G40409 Other generalized epilepsy and epileptic syndromes, not intractable, without status epilepticus: Secondary | ICD-10-CM | POA: Diagnosis present

## 2016-02-12 DIAGNOSIS — B0229 Other postherpetic nervous system involvement: Secondary | ICD-10-CM | POA: Diagnosis present

## 2016-02-12 DIAGNOSIS — R109 Unspecified abdominal pain: Secondary | ICD-10-CM | POA: Diagnosis not present

## 2016-02-12 DIAGNOSIS — D1803 Hemangioma of intra-abdominal structures: Secondary | ICD-10-CM | POA: Diagnosis present

## 2016-02-12 DIAGNOSIS — Z85038 Personal history of other malignant neoplasm of large intestine: Secondary | ICD-10-CM

## 2016-02-12 DIAGNOSIS — Z9221 Personal history of antineoplastic chemotherapy: Secondary | ICD-10-CM

## 2016-02-12 DIAGNOSIS — G629 Polyneuropathy, unspecified: Secondary | ICD-10-CM | POA: Diagnosis present

## 2016-02-12 DIAGNOSIS — Z8619 Personal history of other infectious and parasitic diseases: Secondary | ICD-10-CM

## 2016-02-12 DIAGNOSIS — D72819 Decreased white blood cell count, unspecified: Secondary | ICD-10-CM | POA: Diagnosis present

## 2016-02-12 DIAGNOSIS — N390 Urinary tract infection, site not specified: Secondary | ICD-10-CM | POA: Diagnosis present

## 2016-02-12 DIAGNOSIS — K56609 Unspecified intestinal obstruction, unspecified as to partial versus complete obstruction: Secondary | ICD-10-CM

## 2016-02-12 DIAGNOSIS — E876 Hypokalemia: Secondary | ICD-10-CM | POA: Diagnosis present

## 2016-02-12 DIAGNOSIS — Z86718 Personal history of other venous thrombosis and embolism: Secondary | ICD-10-CM

## 2016-02-12 DIAGNOSIS — G40309 Generalized idiopathic epilepsy and epileptic syndromes, not intractable, without status epilepticus: Secondary | ICD-10-CM | POA: Diagnosis present

## 2016-02-12 DIAGNOSIS — K56600 Partial intestinal obstruction, unspecified as to cause: Secondary | ICD-10-CM | POA: Diagnosis not present

## 2016-02-12 HISTORY — DX: Essential (primary) hypertension: I10

## 2016-02-12 LAB — URINALYSIS, ROUTINE W REFLEX MICROSCOPIC
Bilirubin Urine: NEGATIVE
Glucose, UA: NEGATIVE mg/dL
Hgb urine dipstick: NEGATIVE
KETONES UR: 15 mg/dL — AB
NITRITE: NEGATIVE
PH: 7 (ref 5.0–8.0)
Protein, ur: 30 mg/dL — AB
SPECIFIC GRAVITY, URINE: 1.03 (ref 1.005–1.030)

## 2016-02-12 LAB — URINE MICROSCOPIC-ADD ON: RBC / HPF: NONE SEEN RBC/hpf (ref 0–5)

## 2016-02-12 LAB — COMPREHENSIVE METABOLIC PANEL
ALK PHOS: 49 U/L (ref 38–126)
ALT: 15 U/L (ref 14–54)
AST: 25 U/L (ref 15–41)
Albumin: 3.6 g/dL (ref 3.5–5.0)
Anion gap: 6 (ref 5–15)
BILIRUBIN TOTAL: 0.4 mg/dL (ref 0.3–1.2)
BUN: 12 mg/dL (ref 6–20)
CALCIUM: 9.4 mg/dL (ref 8.9–10.3)
CO2: 26 mmol/L (ref 22–32)
CREATININE: 0.89 mg/dL (ref 0.44–1.00)
Chloride: 106 mmol/L (ref 101–111)
Glucose, Bld: 115 mg/dL — ABNORMAL HIGH (ref 65–99)
Potassium: 3.9 mmol/L (ref 3.5–5.1)
Sodium: 138 mmol/L (ref 135–145)
TOTAL PROTEIN: 7.6 g/dL (ref 6.5–8.1)

## 2016-02-12 LAB — CBC
HEMATOCRIT: 39.4 % (ref 36.0–46.0)
HEMOGLOBIN: 13.2 g/dL (ref 12.0–15.0)
MCH: 31.7 pg (ref 26.0–34.0)
MCHC: 33.5 g/dL (ref 30.0–36.0)
MCV: 94.7 fL (ref 78.0–100.0)
Platelets: 172 10*3/uL (ref 150–400)
RBC: 4.16 MIL/uL (ref 3.87–5.11)
RDW: 12.3 % (ref 11.5–15.5)
WBC: 3.5 10*3/uL — AB (ref 4.0–10.5)

## 2016-02-12 LAB — LIPASE, BLOOD: Lipase: 52 U/L — ABNORMAL HIGH (ref 11–51)

## 2016-02-12 MED ORDER — IOPAMIDOL (ISOVUE-300) INJECTION 61%
INTRAVENOUS | Status: AC
  Administered 2016-02-13: 100 mL
  Filled 2016-02-12: qty 100

## 2016-02-12 MED ORDER — MORPHINE SULFATE (PF) 4 MG/ML IV SOLN
4.0000 mg | Freq: Once | INTRAVENOUS | Status: AC
  Administered 2016-02-12: 4 mg via INTRAVENOUS
  Filled 2016-02-12: qty 1

## 2016-02-12 MED ORDER — ONDANSETRON HCL 4 MG/2ML IJ SOLN
4.0000 mg | Freq: Once | INTRAMUSCULAR | Status: AC
  Administered 2016-02-12: 4 mg via INTRAVENOUS
  Filled 2016-02-12: qty 2

## 2016-02-12 NOTE — Progress Notes (Signed)
I have reviewed and agreed above plan. 

## 2016-02-12 NOTE — ED Notes (Signed)
EDP at bedside  

## 2016-02-12 NOTE — ED Triage Notes (Signed)
Pt st's she has had mid to left abd pain off and on x's 3-4 days but worse today.  Nausea with vomiting, has vomited x's 2 today.  Denies diarrhea, last BM earlier today

## 2016-02-12 NOTE — ED Provider Notes (Signed)
[place] DEPT Provider Note   CSN: [id number] Arrival date & time: 02/12/16  1756     History   Chief Complaint Chief Complaint  Patient presents with  . Abdominal Pain    HPI [name] is a 70 y.o. female.  The history is provided by the patient and medical records. No language interpreter was used.  Abdominal Pain   Associated symptoms include nausea and vomiting. Pertinent negatives include fever, diarrhea, constipation, dysuria and headaches.   [name] is a 70 y.o. female  with a PMH of metastatic colon cx in remission since 2011, HTN who presents to the Emergency Department complaining of intermittent aching left-sided abdominal pain x 2-3 weeks which acutely worsened this morning. She has also felt bloated and belching more than usual. This morning, she became nauseous and had two episodes of emesis. Has been having regular bowel movements daily with no blood. Took Rolaids which initially helped, but has not been helping for the last few days. No other medications taken prior to arrival for symptoms. No fevers, back pain, shortness of breath, chest pain, dysuria.   Past Medical History:  Diagnosis Date  . Anemia   . Arthritis   . Asthma   . Clotting disorder ([place])   . Colon cancer ([place])   . DVT (deep venous thrombosis) ([facility])    right jugular  . Hypertension   . 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 PONV (postoperative nausea and vomiting)   . Seizures ([place])   . Shingles rash   . Spinal headache     Patient Active Problem List   Diagnosis Date Noted  . Small bowel obstruction 02/13/2016  . Seizures ([place]) 05/08/2015  . 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 Malignant neoplasm of colon ([place]) 02/07/2011  . Breast mass seen on mammogram 02/02/2011  . History of colon cancer, stage IV 02/02/2011    Past Surgical History:  Procedure Laterality Date  .  ABDOMINAL HYSTERECTOMY  2012  . COLON SURGERY    . COLONOSCOPY    . PORTACATH PLACEMENT    . RADIOFREQUENCY ABLATION LIVER TUMOR    . SMALL INTESTINE SURGERY      OB History    No data available       Home Medications    Prior to Admission medications   Medication Sig Start Date End Date Taking? Authorizing Provider  albuterol (PROVENTIL HFA;VENTOLIN HFA) 108 (90 BASE) MCG/ACT inhaler Inhale 2 puffs into the lungs every 6 (six) hours as needed for wheezing or shortness of breath.    Yes Historical Provider, MD  aspirin EC 81 MG tablet Take 81 mg by mouth daily.   Yes Historical Provider, MD  calcium-vitamin D (OSCAL WITH D) 500-200 MG-UNIT tablet Take 1 tablet by mouth.   Yes Historical Provider, MD  cetirizine (ZYRTEC) 10 MG tablet Take 10 mg by mouth at bedtime.    Yes Historical Provider, MD  cloNIDine (CATAPRES) 0.1 MG tablet Take 0.1 mg by mouth 2 (two) times daily.    Yes Historical Provider, MD  diltiazem (CARDIZEM CD) 180 MG 24 hr capsule Take 180 mg by mouth daily. 12/06/13  Yes Historical Provider, MD  ferrous sulfate 325 (65 FE) MG tablet Take 325 mg by mouth 3 (three) times daily with meals.   Yes Historical Provider, MD  Fluticasone-Salmeterol (ADVAIR) 100-50 MCG/DOSE AEPB Inhale 2 puffs into the lungs 2 (two) times  daily as needed (wheezing).    Yes Historical Provider, MD  furosemide (LASIX) 20 MG tablet Take 20 mg by mouth daily.   Yes Historical Provider, MD  gabapentin (NEURONTIN) 600 MG tablet Take 600 mg by mouth 3 (three) times daily. 05/14/15  Yes Historical Provider, MD  levETIRAcetam (KEPPRA) 500 MG tablet Take 1 tablet (500 mg total) by mouth 2 (two) times daily. 05/08/15  Yes Marcial Pacas, MD  losartan-hydrochlorothiazide (HYZAAR) 100-12.5 MG tablet Take 1 tablet by mouth daily.   Yes Historical Provider, MD  meloxicam (MOBIC) 15 MG tablet Take 15 mg by mouth daily. 05/14/15  Yes Historical Provider, MD  Multiple Vitamins-Minerals (CENTRUM SILVER ADULT 50+ PO) Take 1  tablet by mouth daily.   Yes Historical Provider, MD  PARoxetine (PAXIL) 20 MG tablet Take 20 mg by mouth daily.   Yes Historical Provider, MD  polyethylene glycol (MIRALAX / GLYCOLAX) packet Take 17 g by mouth daily as needed for mild constipation.   Yes Historical Provider, MD  potassium chloride SA (K-DUR,KLOR-CON) 20 MEQ tablet Take 20 mEq by mouth daily.    Yes Historical Provider, MD  traZODone (DESYREL) 50 MG tablet Take 50 mg by mouth at bedtime. 06/24/15  Yes Historical Provider, MD    Family History Family History  Problem Relation Age of Onset  . Hypertension Father   . Cancer Brother     colon  . Cancer Daughter     colon  . Breast cancer Sister     Social History Social History  Substance Use Topics  . Smoking status: Never Smoker  . Smokeless tobacco: Never Used  . Alcohol use No     Allergies   Patient has no known allergies.   Review of Systems Review of Systems  Constitutional: Negative for fever.  HENT: Negative for congestion.   Eyes: Negative for visual disturbance.  Respiratory: Negative for cough and shortness of breath.   Cardiovascular: Negative.   Gastrointestinal: Positive for abdominal pain, nausea and vomiting. Negative for blood in stool, constipation and diarrhea.  Genitourinary: Negative for dysuria.  Musculoskeletal: Negative for back pain.  Skin: Negative for color change and rash.  Neurological: Negative for headaches.     Physical Exam Updated Vital Signs BP (!) 183/86 (BP Location: Right Arm) Comment: RN notified  Pulse 79   Temp 98.4 F (36.9 C) (Oral)   Resp 17   Ht '5\' 2"'  (1.575 m)   Wt 78.5 kg   SpO2 97%   BMI 31.64 kg/m   Physical Exam  Constitutional: She is oriented to person, place, and time. She appears well-developed and well-nourished. No distress.  HENT:  Head: Normocephalic and atraumatic.  Cardiovascular: Normal rate, regular rhythm and normal heart sounds.   No murmur heard. Pulmonary/Chest: Effort  normal and breath sounds normal. No respiratory distress. She has no wheezes. She has no rales. She exhibits no tenderness.  Abdominal: Soft. She exhibits no distension. There is tenderness (LUQ, LLQ).  Musculoskeletal: She exhibits no edema.  Neurological: She is alert and oriented to person, place, and time.  Skin: Skin is warm and dry.  Nursing note and vitals reviewed.    ED Treatments / Results  Labs (all labs ordered are listed, but only abnormal results are displayed) Labs Reviewed  LIPASE, BLOOD - Abnormal; Notable for the following:       Result Value   Lipase 52 (*)    All other components within normal limits  COMPREHENSIVE METABOLIC PANEL - Abnormal; Notable for  the following:    Glucose, Bld 115 (*)    All other components within normal limits  CBC - Abnormal; Notable for the following:    WBC 3.5 (*)    All other components within normal limits  URINALYSIS, ROUTINE W REFLEX MICROSCOPIC (NOT AT [facility]) - Abnormal; Notable for the following:    Color, Urine AMBER (*)    APPearance CLOUDY (*)    Ketones, ur 15 (*)    Protein, ur 30 (*)    Leukocytes, UA MODERATE (*)    All other components within normal limits  URINE MICROSCOPIC-ADD ON - Abnormal; Notable for the following:    Squamous Epithelial / LPF 0-5 (*)    Bacteria, UA RARE (*)    All other components within normal limits  URINE CULTURE    EKG  EKG Interpretation None       Radiology Ct Abdomen Pelvis W Contrast  Result Date: 02/13/2016 CLINICAL DATA:  Abdominal pain radiating to the left flank with nausea and vomiting for 2 weeks. History of colon cancer metastatic to the liver. EXAM: CT ABDOMEN AND PELVIS WITH CONTRAST TECHNIQUE: Multidetector CT imaging of the abdomen and pelvis was performed using the standard protocol following bolus administration of intravenous contrast. CONTRAST:  100 ml ISOVUE-300 IOPAMIDOL (ISOVUE-300) INJECTION 61% COMPARISON:  07/03/2015 FINDINGS: Lower chest: Atelectasis in  the lung bases. Mild bronchiectasis. Small peripheral blebs. Hepatobiliary: Liver demonstrates a large mass involving the right lobe of the liver and measuring up to 5.4 bile 11.3 x 10 cm. Additional 11 mm mass in the lateral segment left lobe of liver. These lesions demonstrate enhancement characteristics consistent with cavernous hemangiomas. No change since previous studies. Simple appearing cysts centrally in the liver at the junction of the left medial and lateral lobes. No new lesions to suggest metastatic disease. Pancreas: Mild pancreatic ductal dilatation similar prior study. No focal mass or stone identified. No inflammatory infiltration. Spleen: Normal in size without focal abnormality. Adrenals/Urinary Tract: No adrenal gland nodules. Small peripheral sub cm cysts in the kidneys. No hydronephrosis or hydroureter. No solid renal mass lesions. Bladder wall is not thickened. Stomach/Bowel: Stomach is fluid-filled and distended without wall thickening. Proximal small bowel are distended mostly with fluid. Transition zone is at the level of the anastomosis in the right lower quadrant findings may represent anastomotic stricture. Terminal ileum is decompressed. Diffusely stool-filled colon without distention. Appendix is normal. Vascular/Lymphatic: Aortic atherosclerosis. No enlarged abdominal or pelvic lymph nodes. Reproductive: Status post hysterectomy. No adnexal masses. Other: Prominent fat in the left inguinal region may represent postoperative changes from hernia repair or lipoma. No change since prior study. No free air or free fluid in the abdomen. Minimal ventral hernia containing fat and partial wall of transverse colon. Musculoskeletal: Degenerative changes in the spine. Lucency arising from the inferior endplate of the L3 vertebra is progressing since previous study. This could represent a developing Schmorl's node but destructive bone lesion is not excluded. Consider bone scan for further  evaluation. IMPRESSION: Small bowel obstruction with transition zone in the right lower quadrant at the level of the anastomosis, possible anastomotic stricture. Giant hemangioma in the right lobe of the liver with smaller hemangioma in the left lobe of the liver. No evidence of developing metastatic disease. Nonspecific developing lucency in the L3 vertebra could represent Schmorl's node but destructive bone lesion should be excluded. Consider bone scan for further evaluation. Electronically Signed   By: Lucienne Capers M.D.   On: 02/13/2016 00:46  Procedures Procedures (including critical care time)  Medications Ordered in ED Medications  levETIRAcetam (KEPPRA) tablet 500 mg (500 mg Oral Given 02/13/16 0259)  PARoxetine (PAXIL) tablet 20 mg (not administered)  diltiazem (CARDIZEM CD) 24 hr capsule 180 mg (not administered)  albuterol (PROVENTIL) (2.5 MG/3ML) 0.083% nebulizer solution 2.5 mg (not administered)  cloNIDine (CATAPRES) tablet 0.1 mg (0.1 mg Oral Given 02/13/16 0259)  mometasone-formoterol (DULERA) 100-5 MCG/ACT inhaler 2 puff (not administered)  enoxaparin (LOVENOX) injection 40 mg (not administered)  0.9 % NaCl with KCl 20 mEq/ L  infusion ( Intravenous New Bag/Given 02/13/16 0255)  morphine 2 MG/ML injection 2-4 mg (not administered)  diphenhydrAMINE (BENADRYL) 12.5 MG/5ML elixir 12.5 mg (not administered)    Or  diphenhydrAMINE (BENADRYL) injection 12.5 mg (not administered)  ondansetron (ZOFRAN-ODT) disintegrating tablet 4 mg (not administered)    Or  ondansetron (ZOFRAN) injection 4 mg (not administered)  losartan (COZAAR) tablet 100 mg (not administered)    And  hydrochlorothiazide (MICROZIDE) capsule 12.5 mg (not administered)  morphine 4 MG/ML injection 4 mg (4 mg Intravenous Given 02/12/16 2331)  ondansetron (ZOFRAN) injection 4 mg (4 mg Intravenous Given 02/12/16 2331)  iopamidol (ISOVUE-300) 61 % injection (100 mLs  Contrast Given 02/13/16 0002)     Initial  Impression / Assessment and Plan / ED Course  I have reviewed the triage vital signs and the nursing notes.  Pertinent labs & imaging results that were available during my care of the patient were reviewed by me and considered in my medical decision making (see chart for details).  Clinical Course    Analya Louissaint Qualls is a 70 y.o. female who presents to ED for left sided abdominal pain associated with nausea and two episodes of emesis today. On exam, patient is afebrile and non-toxic appearing. She does have tenderness to the left abdomen but no focal areas of tenderness. Labs reviewed. WBC of 3.5. Lipase slightly elevated but pancreatitis unlikely.   UA reviewed. Patient does appear to have UTI. Moderate leuks and TNTC WBC's. Urine sent for culture.   CT shows SBO with transition zone in the right lower quadrant at the level of anastomisis. Nonspecific developing lucency in the L3 vertebra incidentally found as well. Radiology recommends bone scan for further evaluation.   1:03 AM - Consulted general surgery, Dr. Georgette Dover, who will evaluate patient.   General surgery to admit.   Patient seen by and discussed with Dr. Lita Mains who agrees with treatment plan.   Final Clinical Impressions(s) / ED Diagnoses   Final diagnoses:  Left sided abdominal pain    New Prescriptions Current Discharge Medication List       Punta Santiago, PA-C 02/13/16 9833    Julianne Rice, MD 02/13/16 [phone]

## 2016-02-13 ENCOUNTER — Encounter (HOSPITAL_COMMUNITY): Payer: Self-pay | Admitting: Radiology

## 2016-02-13 ENCOUNTER — Emergency Department (HOSPITAL_COMMUNITY): Payer: Medicare Other

## 2016-02-13 DIAGNOSIS — R109 Unspecified abdominal pain: Secondary | ICD-10-CM | POA: Diagnosis present

## 2016-02-13 DIAGNOSIS — Z9221 Personal history of antineoplastic chemotherapy: Secondary | ICD-10-CM | POA: Diagnosis not present

## 2016-02-13 DIAGNOSIS — G40409 Other generalized epilepsy and epileptic syndromes, not intractable, without status epilepticus: Secondary | ICD-10-CM | POA: Diagnosis present

## 2016-02-13 DIAGNOSIS — Z90710 Acquired absence of both cervix and uterus: Secondary | ICD-10-CM | POA: Diagnosis not present

## 2016-02-13 DIAGNOSIS — Z8619 Personal history of other infectious and parasitic diseases: Secondary | ICD-10-CM | POA: Diagnosis not present

## 2016-02-13 DIAGNOSIS — K56600 Partial intestinal obstruction, unspecified as to cause: Secondary | ICD-10-CM | POA: Diagnosis present

## 2016-02-13 DIAGNOSIS — D72819 Decreased white blood cell count, unspecified: Secondary | ICD-10-CM | POA: Diagnosis present

## 2016-02-13 DIAGNOSIS — Z803 Family history of malignant neoplasm of breast: Secondary | ICD-10-CM | POA: Diagnosis not present

## 2016-02-13 DIAGNOSIS — Z85038 Personal history of other malignant neoplasm of large intestine: Secondary | ICD-10-CM | POA: Diagnosis not present

## 2016-02-13 DIAGNOSIS — Z8249 Family history of ischemic heart disease and other diseases of the circulatory system: Secondary | ICD-10-CM | POA: Diagnosis not present

## 2016-02-13 DIAGNOSIS — I10 Essential (primary) hypertension: Secondary | ICD-10-CM | POA: Diagnosis present

## 2016-02-13 DIAGNOSIS — E876 Hypokalemia: Secondary | ICD-10-CM | POA: Diagnosis present

## 2016-02-13 DIAGNOSIS — K56609 Unspecified intestinal obstruction, unspecified as to partial versus complete obstruction: Secondary | ICD-10-CM | POA: Diagnosis present

## 2016-02-13 DIAGNOSIS — C787 Secondary malignant neoplasm of liver and intrahepatic bile duct: Secondary | ICD-10-CM | POA: Diagnosis present

## 2016-02-13 DIAGNOSIS — Z86718 Personal history of other venous thrombosis and embolism: Secondary | ICD-10-CM | POA: Diagnosis not present

## 2016-02-13 DIAGNOSIS — Z7982 Long term (current) use of aspirin: Secondary | ICD-10-CM | POA: Diagnosis not present

## 2016-02-13 DIAGNOSIS — B0229 Other postherpetic nervous system involvement: Secondary | ICD-10-CM | POA: Diagnosis present

## 2016-02-13 DIAGNOSIS — G629 Polyneuropathy, unspecified: Secondary | ICD-10-CM | POA: Diagnosis present

## 2016-02-13 DIAGNOSIS — D1803 Hemangioma of intra-abdominal structures: Secondary | ICD-10-CM | POA: Diagnosis present

## 2016-02-13 DIAGNOSIS — N390 Urinary tract infection, site not specified: Secondary | ICD-10-CM | POA: Diagnosis present

## 2016-02-13 DIAGNOSIS — Z9049 Acquired absence of other specified parts of digestive tract: Secondary | ICD-10-CM | POA: Diagnosis not present

## 2016-02-13 MED ORDER — POTASSIUM CHLORIDE IN NACL 20-0.9 MEQ/L-% IV SOLN
INTRAVENOUS | Status: DC
  Administered 2016-02-13 – 2016-02-18 (×7): via INTRAVENOUS
  Administered 2016-02-19: 100 mL/h via INTRAVENOUS
  Filled 2016-02-13 (×15): qty 1000

## 2016-02-13 MED ORDER — MORPHINE SULFATE (PF) 2 MG/ML IV SOLN
2.0000 mg | INTRAVENOUS | Status: DC | PRN
  Administered 2016-02-14 – 2016-02-16 (×8): 4 mg via INTRAVENOUS
  Administered 2016-02-17 – 2016-02-19 (×9): 2 mg via INTRAVENOUS
  Filled 2016-02-13: qty 2
  Filled 2016-02-13: qty 1
  Filled 2016-02-13 (×2): qty 2
  Filled 2016-02-13: qty 1
  Filled 2016-02-13: qty 2
  Filled 2016-02-13 (×3): qty 1
  Filled 2016-02-13 (×3): qty 2
  Filled 2016-02-13: qty 1
  Filled 2016-02-13: qty 2
  Filled 2016-02-13 (×3): qty 1

## 2016-02-13 MED ORDER — LOSARTAN POTASSIUM-HCTZ 100-12.5 MG PO TABS: 1.0000 | ORAL_TABLET | Freq: Every day | ORAL | Status: DC

## 2016-02-13 MED ORDER — MOMETASONE FURO-FORMOTEROL FUM 100-5 MCG/ACT IN AERO
2.0000 | INHALATION_SPRAY | Freq: Two times a day (BID) | RESPIRATORY_TRACT | Status: DC
Start: 2016-02-13 — End: 2016-02-16
  Administered 2016-02-15: 2 via RESPIRATORY_TRACT
  Filled 2016-02-13: qty 8.8

## 2016-02-13 MED ORDER — ENOXAPARIN SODIUM 40 MG/0.4ML ~~LOC~~ SOLN
40.0000 mg | SUBCUTANEOUS | Status: DC
  Administered 2016-02-14 – 2016-02-20 (×7): 40 mg via SUBCUTANEOUS
  Filled 2016-02-13 (×7): qty 0.4

## 2016-02-13 MED ORDER — DILTIAZEM HCL ER COATED BEADS 180 MG PO CP24
180.0000 mg | ORAL_CAPSULE | Freq: Every day | ORAL | Status: DC
  Administered 2016-02-13 – 2016-02-20 (×8): 180 mg via ORAL
  Filled 2016-02-13 (×8): qty 1

## 2016-02-13 MED ORDER — ONDANSETRON 4 MG PO TBDP: 4.0000 mg | ORAL_TABLET | Freq: Four times a day (QID) | ORAL | Status: DC | PRN

## 2016-02-13 MED ORDER — ONDANSETRON HCL 4 MG/2ML IJ SOLN
4.0000 mg | Freq: Four times a day (QID) | INTRAMUSCULAR | Status: DC | PRN
  Administered 2016-02-14 – 2016-02-15 (×4): 4 mg via INTRAVENOUS
  Filled 2016-02-13 (×4): qty 2

## 2016-02-13 MED ORDER — ALBUTEROL SULFATE (2.5 MG/3ML) 0.083% IN NEBU: 2.5000 mg | INHALATION_SOLUTION | Freq: Four times a day (QID) | RESPIRATORY_TRACT | Status: DC | PRN

## 2016-02-13 MED ORDER — CLONIDINE HCL 0.1 MG PO TABS
0.1000 mg | ORAL_TABLET | Freq: Two times a day (BID) | ORAL | Status: DC
  Administered 2016-02-13 – 2016-02-17 (×10): 0.1 mg via ORAL
  Filled 2016-02-13 (×10): qty 1

## 2016-02-13 MED ORDER — PAROXETINE HCL 20 MG PO TABS
20.0000 mg | ORAL_TABLET | Freq: Every day | ORAL | Status: DC
  Administered 2016-02-13 – 2016-02-20 (×8): 20 mg via ORAL
  Filled 2016-02-13 (×8): qty 1

## 2016-02-13 MED ORDER — LEVETIRACETAM 500 MG PO TABS
500.0000 mg | ORAL_TABLET | Freq: Two times a day (BID) | ORAL | Status: DC
  Administered 2016-02-13 – 2016-02-20 (×16): 500 mg via ORAL
  Filled 2016-02-13 (×16): qty 1

## 2016-02-13 MED ORDER — LOSARTAN POTASSIUM 50 MG PO TABS
100.0000 mg | ORAL_TABLET | Freq: Every day | ORAL | Status: DC
  Administered 2016-02-13 – 2016-02-20 (×8): 100 mg via ORAL
  Filled 2016-02-13 (×8): qty 2

## 2016-02-13 MED ORDER — DIPHENHYDRAMINE HCL 50 MG/ML IJ SOLN: 12.5000 mg | Freq: Four times a day (QID) | INTRAMUSCULAR | Status: DC | PRN

## 2016-02-13 MED ORDER — HYDROCHLOROTHIAZIDE 12.5 MG PO CAPS
12.5000 mg | ORAL_CAPSULE | Freq: Every day | ORAL | Status: DC
  Administered 2016-02-13 – 2016-02-20 (×8): 12.5 mg via ORAL
  Filled 2016-02-13 (×8): qty 1

## 2016-02-13 MED ORDER — HYDROCODONE-ACETAMINOPHEN 5-325 MG PO TABS
1.0000 | ORAL_TABLET | Freq: Four times a day (QID) | ORAL | Status: DC | PRN
  Administered 2016-02-13 – 2016-02-19 (×11): 1 via ORAL
  Filled 2016-02-13 (×11): qty 1

## 2016-02-13 MED ORDER — DIPHENHYDRAMINE HCL 12.5 MG/5ML PO ELIX: 12.5000 mg | ORAL_SOLUTION | Freq: Four times a day (QID) | ORAL | Status: DC | PRN

## 2016-02-13 NOTE — H&P (Signed)
[name] is an 70 y.o. female.   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  Surgeon - Pecolia Ades, Oncology  - [place].  She had been doing well but over the last couple of weeks, she has developed some vague left-sided abdominal pain.  Yesterday, she developed abdominal distention, nausea, vomiting x 2.  She had a normal bowel movement yesterday and continues to pass occasional flatus.  She feels much better after vomiting.  Past Medical History:  Diagnosis Date  . Anemia   . Arthritis   . Asthma   . Clotting disorder ([place])   . Colon cancer ([place])   . DVT (deep venous thrombosis) ([facility])    right jugular  . Hypertension   . 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 PONV (postoperative nausea and vomiting)   . Seizures ([place])   . Shingles rash   . Spinal headache     Past Surgical History:  Procedure Laterality Date  . ABDOMINAL HYSTERECTOMY  2012  . COLON SURGERY    . COLONOSCOPY    . PORTACATH PLACEMENT    . RADIOFREQUENCY ABLATION LIVER TUMOR    . SMALL INTESTINE SURGERY      Family History  Problem Relation Age of Onset  . Hypertension Father   . Cancer Brother     colon  . Cancer Daughter     colon  . Breast cancer Sister    Social History:  reports that she has never smoked. She has never used smokeless tobacco. She reports that she does not drink alcohol or use drugs.  Allergies: No Known Allergies  Prior to Admission medications   Medication Sig Start Date End Date Taking? Authorizing Provider  albuterol (PROVENTIL HFA;VENTOLIN HFA) 108 (90 BASE) MCG/ACT inhaler Inhale 2 puffs into the lungs every 6 (six) hours as needed for wheezing or shortness of breath.    Yes Historical Provider, MD  aspirin EC 81 MG tablet Take 81 mg by mouth daily.   Yes Historical Provider, MD   calcium-vitamin D (OSCAL WITH D) 500-200 MG-UNIT tablet Take 1 tablet by mouth.   Yes Historical Provider, MD  cetirizine (ZYRTEC) 10 MG tablet Take 10 mg by mouth at bedtime.    Yes Historical Provider, MD  cloNIDine (CATAPRES) 0.1 MG tablet Take 0.1 mg by mouth 2 (two) times daily.    Yes Historical Provider, MD  diltiazem (CARDIZEM CD) 180 MG 24 hr capsule Take 180 mg by mouth daily. 12/06/13  Yes Historical Provider, MD  ferrous sulfate 325 (65 FE) MG tablet Take 325 mg by mouth 3 (three) times daily with meals.   Yes Historical Provider, MD  Fluticasone-Salmeterol (ADVAIR) 100-50 MCG/DOSE AEPB Inhale 2 puffs into the lungs 2 (two) times daily as needed (wheezing).    Yes Historical Provider, MD  furosemide (LASIX) 20 MG tablet Take 20 mg by mouth daily.   Yes Historical Provider, MD  gabapentin (NEURONTIN) 600 MG tablet Take 600 mg by mouth 3 (three) times daily. 05/14/15  Yes Historical Provider, MD  levETIRAcetam (KEPPRA) 500 MG tablet Take 1 tablet (500 mg total) by mouth 2 (two) times daily. 05/08/15  Yes Marcial Pacas, MD  losartan-hydrochlorothiazide (HYZAAR) 100-12.5 MG tablet Take 1 tablet by mouth daily.   Yes Historical Provider, MD  meloxicam (MOBIC) 15 MG tablet Take 15 mg by mouth daily. 05/14/15  Yes Historical Provider, MD  Multiple Vitamins-Minerals (CENTRUM SILVER ADULT 50+ PO) Take 1 tablet by mouth daily.   Yes Historical Provider, MD  PARoxetine (PAXIL) 20 MG tablet Take 20 mg by mouth daily.   Yes Historical Provider, MD  polyethylene glycol (MIRALAX / GLYCOLAX) packet Take 17 g by mouth daily as needed for mild constipation.   Yes Historical Provider, MD  potassium chloride SA (K-DUR,KLOR-CON) 20 MEQ tablet Take 20 mEq by mouth daily.    Yes Historical Provider, MD  traZODone (DESYREL) 50 MG tablet Take 50 mg by mouth at bedtime. 06/24/15  Yes Historical Provider, MD     Results for orders placed or performed during the hospital encounter of 02/12/16 (from the past 48 hour(s))   Lipase, blood     Status: Abnormal   Collection Time: 02/12/16  6:37 PM  Result Value Ref Range   Lipase 52 (H) 11 - 51 U/L  Comprehensive metabolic panel     Status: Abnormal   Collection Time: 02/12/16  6:37 PM  Result Value Ref Range   Sodium 138 135 - 145 mmol/L   Potassium 3.9 3.5 - 5.1 mmol/L   Chloride 106 101 - 111 mmol/L   CO2 26 22 - 32 mmol/L   Glucose, Bld 115 (H) 65 - 99 mg/dL   BUN 12 6 - 20 mg/dL   Creatinine, Ser 0.89 0.44 - 1.00 mg/dL   Calcium 9.4 8.9 - 10.3 mg/dL   Total Protein 7.6 6.5 - 8.1 g/dL   Albumin 3.6 3.5 - 5.0 g/dL   AST 25 15 - 41 U/L   ALT 15 14 - 54 U/L   Alkaline Phosphatase 49 38 - 126 U/L   Total Bilirubin 0.4 0.3 - 1.2 mg/dL   GFR calc non Af Amer >60 >60 mL/min   GFR calc Af Amer >60 >60 mL/min    Comment: (NOTE) The eGFR has been calculated using the CKD EPI equation. This calculation has not been validated in all clinical situations. eGFR's persistently <60 mL/min signify possible Chronic Kidney Disease.    Anion gap 6 5 - 15  CBC     Status: Abnormal   Collection Time: 02/12/16  6:37 PM  Result Value Ref Range   WBC 3.5 (L) 4.0 - 10.5 K/uL   RBC 4.16 3.87 - 5.11 MIL/uL   Hemoglobin 13.2 12.0 - 15.0 g/dL   HCT 39.4 36.0 - 46.0 %   MCV 94.7 78.0 - 100.0 fL   MCH 31.7 26.0 - 34.0 pg   MCHC 33.5 30.0 - 36.0 g/dL   RDW 12.3 11.5 - 15.5 %   Platelets 172 150 - 400 K/uL  Urinalysis, Routine w reflex microscopic     Status: Abnormal   Collection Time: 02/12/16 10:55 PM  Result Value Ref Range   Color, Urine AMBER (A) YELLOW    Comment: BIOCHEMICALS MAY BE AFFECTED BY COLOR   APPearance CLOUDY (A) CLEAR   Specific Gravity, Urine 1.030 1.005 - 1.030   pH 7.0 5.0 - 8.0   Glucose, UA NEGATIVE NEGATIVE mg/dL   Hgb urine dipstick NEGATIVE NEGATIVE   Bilirubin Urine NEGATIVE NEGATIVE   Ketones, ur 15 (A) NEGATIVE mg/dL   Protein, ur 30 (A) NEGATIVE mg/dL   Nitrite NEGATIVE NEGATIVE   Leukocytes, UA MODERATE (A) NEGATIVE  Urine  microscopic-add on     Status: Abnormal   Collection Time: 02/12/16 10:55 PM  Result  Value Ref Range   Squamous Epithelial / LPF 0-5 (A) NONE SEEN   WBC, UA TOO NUMEROUS TO COUNT 0 - 5 WBC/hpf   RBC / HPF NONE SEEN 0 - 5 RBC/hpf   Bacteria, UA RARE (A) NONE SEEN   Ct Abdomen Pelvis W Contrast  Result Date: 02/13/2016 CLINICAL DATA:  Abdominal pain radiating to the left flank with nausea and vomiting for 2 weeks. History of colon cancer metastatic to the liver. EXAM: CT ABDOMEN AND PELVIS WITH CONTRAST TECHNIQUE: Multidetector CT imaging of the abdomen and pelvis was performed using the standard protocol following bolus administration of intravenous contrast. CONTRAST:  100 ml ISOVUE-300 IOPAMIDOL (ISOVUE-300) INJECTION 61% COMPARISON:  07/03/2015 FINDINGS: Lower chest: Atelectasis in the lung bases. Mild bronchiectasis. Small peripheral blebs. Hepatobiliary: Liver demonstrates a large mass involving the right lobe of the liver and measuring up to 5.4 bile 11.3 x 10 cm. Additional 11 mm mass in the lateral segment left lobe of liver. These lesions demonstrate enhancement characteristics consistent with cavernous hemangiomas. No change since previous studies. Simple appearing cysts centrally in the liver at the junction of the left medial and lateral lobes. No new lesions to suggest metastatic disease. Pancreas: Mild pancreatic ductal dilatation similar prior study. No focal mass or stone identified. No inflammatory infiltration. Spleen: Normal in size without focal abnormality. Adrenals/Urinary Tract: No adrenal gland nodules. Small peripheral sub cm cysts in the kidneys. No hydronephrosis or hydroureter. No solid renal mass lesions. Bladder wall is not thickened. Stomach/Bowel: Stomach is fluid-filled and distended without wall thickening. Proximal small bowel are distended mostly with fluid. Transition zone is at the level of the anastomosis in the right lower quadrant findings may represent anastomotic  stricture. Terminal ileum is decompressed. Diffusely stool-filled colon without distention. Appendix is normal. Vascular/Lymphatic: Aortic atherosclerosis. No enlarged abdominal or pelvic lymph nodes. Reproductive: Status post hysterectomy. No adnexal masses. Other: Prominent fat in the left inguinal region may represent postoperative changes from hernia repair or lipoma. No change since prior study. No free air or free fluid in the abdomen. Minimal ventral hernia containing fat and partial wall of transverse colon. Musculoskeletal: Degenerative changes in the spine. Lucency arising from the inferior endplate of the L3 vertebra is progressing since previous study. This could represent a developing Schmorl's node but destructive bone lesion is not excluded. Consider bone scan for further evaluation. IMPRESSION: Small bowel obstruction with transition zone in the right lower quadrant at the level of the anastomosis, possible anastomotic stricture. Giant hemangioma in the right lobe of the liver with smaller hemangioma in the left lobe of the liver. No evidence of developing metastatic disease. Nonspecific developing lucency in the L3 vertebra could represent Schmorl's node but destructive bone lesion should be excluded. Consider bone scan for further evaluation. Electronically Signed   By: Lucienne Capers M.D.   On: 02/13/2016 00:46    Review of Systems  Constitutional: Negative for weight loss.  HENT: Negative for ear discharge, ear pain, hearing loss and tinnitus.   Eyes: Negative for blurred vision, double vision, photophobia and pain.  Respiratory: Negative for cough, sputum production and shortness of breath.   Cardiovascular: Negative for chest pain.  Gastrointestinal: Positive for abdominal pain, nausea and vomiting.  Genitourinary: Negative for dysuria, flank pain, frequency and urgency.  Musculoskeletal: Negative for back pain, falls, joint pain, myalgias and neck pain.  Neurological: Negative  for dizziness, tingling, sensory change, focal weakness, loss of consciousness and headaches.  Endo/Heme/Allergies: Does not bruise/bleed easily.  Psychiatric/Behavioral: Negative for depression, memory loss and substance abuse. The patient is not nervous/anxious.     Blood pressure 181/83, pulse 81, temperature 98.2 F (36.8 C), temperature source Oral, resp. rate 18, height '5\' 2"'$  (1.575 m), weight 78.5 kg (173 lb), SpO2 98 %. Physical Exam  WDWN in NAD Eyes:  Pupils equal, round; sclera anicteric HENT:  Oral mucosa moist; good dentition  Neck:  No masses palpated, no thyromegaly Lungs:  CTA bilaterally; normal respiratory effort CV:  Regular rate and rhythm; no murmurs; extremities well-perfused with no edema Abd:  +active bowel sounds; non-distended; healed midline incision with small palpable hernia at upper end; non-tender to palpation Skin:  Warm, dry; no sign of jaundice Psychiatric - alert and oriented x 4; calm mood and affect  Assessment/Plan 1.  Partial small bowel obstruction - probable transition zone at ileocolic anastomosis  Admit to hospital NPO x meds IV hydration If she vomits again, she will need NG tube.  Currently, no nausea or distention.  Maia Petties., MD 02/13/2016, 2:04 AM

## 2016-02-13 NOTE — ED Notes (Signed)
Surgery at bedside.

## 2016-02-13 NOTE — Progress Notes (Signed)
Patient ID: [name], female   DOB: Aug 10, 1945, 70 y.o.   MRN: [id number]  [facility] Surgery Progress Note     Subjective: Feeling better this AM. Abdomen sore but less painful and bloated. No n/v. Passing flatus.  Objective: Vital signs in last 24 hours: Temp:  [98.2 F (36.8 C)-99.2 F (37.3 C)] 98.7 F (37.1 C) (11/10 0552) Pulse Rate:  [71-81] 71 (11/10 0552) Resp:  [16-18] 17 (11/10 0552) BP: (149-183)/(70-89) 149/70 (11/10 0552) SpO2:  [95 %-99 %] 98 % (11/10 0552) Weight:  [173 lb (78.5 kg)] 173 lb (78.5 kg) (11/09 1826) Last BM Date: 02/13/16  Intake/Output from previous day: 11/09 0701 - 11/10 0700 In: 8.3 [I.V.:8.3] Out: -  Intake/Output this shift: No intake/output data recorded.  PE: Gen:  Alert, NAD, pleasant Card: irregular rhythm, regular rate Pulm:  CTAB Abd: Soft, Nd, minimal Left-sided tenderness, +BS, no HSM  Lab Results:   Recent Labs  02/12/16 1837  WBC 3.5*  HGB 13.2  HCT 39.4  PLT 172   BMET  Recent Labs  02/12/16 1837  NA 138  K 3.9  CL 106  CO2 26  GLUCOSE 115*  BUN 12  CREATININE 0.89  CALCIUM 9.4   PT/INR No results for input(s): LABPROT, INR in the last 72 hours. CMP     Component Value Date/Time   NA 138 02/12/2016 1837   NA 142 06/26/2015 0849   K 3.9 02/12/2016 1837   K 3.7 06/26/2015 0849   CL 106 02/12/2016 1837   CL 106 06/19/2012 0936   CO2 26 02/12/2016 1837   CO2 29 06/26/2015 0849   GLUCOSE 115 (H) 02/12/2016 1837   GLUCOSE 96 06/26/2015 0849   GLUCOSE 93 06/19/2012 0936   BUN 12 02/12/2016 1837   BUN 16.7 06/26/2015 0849   CREATININE 0.89 02/12/2016 1837   CREATININE 0.9 06/26/2015 0849   CALCIUM 9.4 02/12/2016 1837   CALCIUM 8.8 06/26/2015 0849   PROT 7.6 02/12/2016 1837   PROT 6.9 06/26/2015 0849   ALBUMIN 3.6 02/12/2016 1837   ALBUMIN 3.1 (L) 06/26/2015 0849   AST 25 02/12/2016 1837   AST 23 06/26/2015 0849   ALT 15 02/12/2016 1837   ALT 16 06/26/2015 0849   ALKPHOS 49  02/12/2016 1837   ALKPHOS 56 06/26/2015 0849   BILITOT 0.4 02/12/2016 1837   BILITOT 0.43 06/26/2015 0849   GFRNONAA >60 02/12/2016 1837   GFRAA >60 02/12/2016 1837   Lipase     Component Value Date/Time   LIPASE 52 (H) 02/12/2016 1837       Studies/Results: Ct Abdomen Pelvis W Contrast  Result Date: 02/13/2016 CLINICAL DATA:  Abdominal pain radiating to the left flank with nausea and vomiting for 2 weeks. History of colon cancer metastatic to the liver. EXAM: CT ABDOMEN AND PELVIS WITH CONTRAST TECHNIQUE: Multidetector CT imaging of the abdomen and pelvis was performed using the standard protocol following bolus administration of intravenous contrast. CONTRAST:  100 ml ISOVUE-300 IOPAMIDOL (ISOVUE-300) INJECTION 61% COMPARISON:  07/03/2015 FINDINGS: Lower chest: Atelectasis in the lung bases. Mild bronchiectasis. Small peripheral blebs. Hepatobiliary: Liver demonstrates a large mass involving the right lobe of the liver and measuring up to 5.4 bile 11.3 x 10 cm. Additional 11 mm mass in the lateral segment left lobe of liver. These lesions demonstrate enhancement characteristics consistent with cavernous hemangiomas. No change since previous studies. Simple appearing cysts centrally in the liver at the junction of the left medial and lateral lobes. No new  lesions to suggest metastatic disease. Pancreas: Mild pancreatic ductal dilatation similar prior study. No focal mass or stone identified. No inflammatory infiltration. Spleen: Normal in size without focal abnormality. Adrenals/Urinary Tract: No adrenal gland nodules. Small peripheral sub cm cysts in the kidneys. No hydronephrosis or hydroureter. No solid renal mass lesions. Bladder wall is not thickened. Stomach/Bowel: Stomach is fluid-filled and distended without wall thickening. Proximal small bowel are distended mostly with fluid. Transition zone is at the level of the anastomosis in the right lower quadrant findings may represent  anastomotic stricture. Terminal ileum is decompressed. Diffusely stool-filled colon without distention. Appendix is normal. Vascular/Lymphatic: Aortic atherosclerosis. No enlarged abdominal or pelvic lymph nodes. Reproductive: Status post hysterectomy. No adnexal masses. Other: Prominent fat in the left inguinal region may represent postoperative changes from hernia repair or lipoma. No change since prior study. No free air or free fluid in the abdomen. Minimal ventral hernia containing fat and partial wall of transverse colon. Musculoskeletal: Degenerative changes in the spine. Lucency arising from the inferior endplate of the L3 vertebra is progressing since previous study. This could represent a developing Schmorl's node but destructive bone lesion is not excluded. Consider bone scan for further evaluation. IMPRESSION: Small bowel obstruction with transition zone in the right lower quadrant at the level of the anastomosis, possible anastomotic stricture. Giant hemangioma in the right lobe of the liver with smaller hemangioma in the left lobe of the liver. No evidence of developing metastatic disease. Nonspecific developing lucency in the L3 vertebra could represent Schmorl's node but destructive bone lesion should be excluded. Consider bone scan for further evaluation. Electronically Signed   By: Lucienne Capers M.D.   On: 02/13/2016 00:46    Anti-infectives: Anti-infectives    None       Assessment/Plan SBO  - CT scan Small bowel obstruction with transition zone in the right lower quadrant at the level of the anastomosis, possible anastomotic stricture. Giant hemangioma in the right lobe of the liver with smaller hemangioma in the left lobe of the liver. No evidence of developing metastatic disease. Nonspecific developing lucency in the L3 vertebra could represent Schmorl's node but destructive bone lesion should be excluded. Consider bone scan for further evaluation. - minimal abdominal pain  today. N/v resolved, no bloating, passing flatus, good BS on exam  Right colon cancer with mets to liver  - s/p right hemicolectomy and RFA of liver lesion at [facility] in 2011 - s/p chemotherapy with FOLFOX 6 and Avastin from 06/08/2009 through 12/19/2009 - Surgeon - Pecolia Ades, Oncology - [place] - in remission  Leukocytopenia - chronic H/o right jugular DVT - port since removed in 2013, no longer on home blood thinners Nonspecific developing lucency in the L3 vertebra could represent Schmorl's node but destructive bone lesion should be excluded  FEN - clears VTE - Lovenox  Plan - less pain today, no n/v or abdominal distension, passing flatus. Trial clears. Ambulate. Labs in AM No symptoms of UTI, follow culture   LOS: 0 days    Jerrye Beavers , [facility] Surgery 02/13/2016, 11:33 AM Pager: [phone] Consults: [phone] Mon-Fri 7:00 am-4:30 pm Sat-Sun 7:00 am-11:30 am

## 2016-02-13 NOTE — ED Notes (Signed)
EDP at bedside  

## 2016-02-14 ENCOUNTER — Inpatient Hospital Stay (HOSPITAL_COMMUNITY): Payer: Medicare Other

## 2016-02-14 LAB — BASIC METABOLIC PANEL
Anion gap: 8 (ref 5–15)
BUN: 9 mg/dL (ref 6–20)
CALCIUM: 8.5 mg/dL — AB (ref 8.9–10.3)
CO2: 24 mmol/L (ref 22–32)
CREATININE: 0.75 mg/dL (ref 0.44–1.00)
Chloride: 106 mmol/L (ref 101–111)
GFR calc non Af Amer: >60 mL/min (ref >60)
Glucose, Bld: 94 mg/dL (ref 65–99)
Potassium: 3.9 mmol/L (ref 3.5–5.1)
Sodium: 138 mmol/L (ref 135–145)

## 2016-02-14 LAB — CBC
HCT: 38.3 % (ref 36.0–46.0)
Hemoglobin: 12.6 g/dL (ref 12.0–15.0)
MCH: 31.4 pg (ref 26.0–34.0)
MCHC: 32.9 g/dL (ref 30.0–36.0)
MCV: 95.5 fL (ref 78.0–100.0)
PLATELETS: 140 10*3/uL — AB (ref 150–400)
RBC: 4.01 MIL/uL (ref 3.87–5.11)
RDW: 12.2 % (ref 11.5–15.5)
WBC: 2.9 10*3/uL — ABNORMAL LOW (ref 4.0–10.5)

## 2016-02-14 LAB — URINE CULTURE

## 2016-02-14 NOTE — Progress Notes (Signed)
  Progress Note: General Surgery Service   Subjective: Nauseated this morning, +flatus this morning, some LUQ pain  Objective: Vital signs in last 24 hours: Temp:  [98.1 F (36.7 C)-99.7 F (37.6 C)] 99.7 F (37.6 C) (11/11 0855) Pulse Rate:  [64-88] 87 (11/11 0855) Resp:  [17-20] 20 (11/11 0855) BP: (155-192)/(68-98) 192/98 (11/11 0855) SpO2:  [91 %-98 %] 98 % (11/11 0855) Last BM Date: 02/13/16  Intake/Output from previous day: 11/10 0701 - 11/11 0700 In: 450 [P.O.:450] Out: -  Intake/Output this shift: No intake/output data recorded.  Lungs: CTAB  Cardiovascular: RRR  Abd: soft, ND, slight pain on palpation left side  Extremities: no edema  Neuro: AOx4  Lab Results: CBC   Recent Labs  02/12/16 1837 02/14/16 0445  WBC 3.5* 2.9*  HGB 13.2 12.6  HCT 39.4 38.3  PLT 172 140*   BMET  Recent Labs  02/12/16 1837 02/14/16 0445  NA 138 138  K 3.9 3.9  CL 106 106  CO2 26 24  GLUCOSE 115* 94  BUN 12 9  CREATININE 0.89 0.75  CALCIUM 9.4 8.5*   PT/INR No results for input(s): LABPROT, INR in the last 72 hours. ABG No results for input(s): PHART, HCO3 in the last 72 hours.  Invalid input(s): PCO2, PO2  Studies/Results:  Anti-infectives: Anti-infectives    None      Medications: Scheduled Meds: . cloNIDine  0.1 mg Oral BID  . diltiazem  180 mg Oral Daily  . enoxaparin (LOVENOX) injection  40 mg Subcutaneous Q24H  . losartan  100 mg Oral Daily   And  . hydrochlorothiazide  12.5 mg Oral Daily  . levETIRAcetam  500 mg Oral BID  . mometasone-formoterol  2 puff Inhalation BID  . PARoxetine  20 mg Oral Daily   Continuous Infusions: . 0.9 % NaCl with KCl 20 mEq / L 100 mL/hr at 02/14/16 0618   PRN Meds:.albuterol, diphenhydrAMINE **OR** diphenhydrAMINE, HYDROcodone-acetaminophen, morphine injection, ondansetron **OR** ondansetron (ZOFRAN) IV  Assessment/Plan: Patient Active Problem List   Diagnosis Date Noted  . Small bowel obstruction  02/13/2016  . Seizures ([place]) 05/08/2015  . 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 Malignant neoplasm of colon ([place]) 02/07/2011  . Breast mass seen on mammogram 02/02/2011  . History of colon cancer, stage IV 02/02/2011  SBO xr this am shows air and stool in colon and decrease in small bowel gas -nauseated currently - continue clear liquids -ambulate -if vomits --> NG tube        LOS: 1 day   Mickeal Skinner, MD Pg# [phone] [facility]

## 2016-02-14 NOTE — Progress Notes (Signed)
Pt reported she felt that:" she is going to pass out." Pt BP was 183/83., complained nausea, Zofran was given at 0730. Paged [facility]. ON call MD is aware. Informed that carry original order. Would check pt after MD finish surgery. Comfort care provided to pt and will close monitor pt.

## 2016-02-15 ENCOUNTER — Inpatient Hospital Stay (HOSPITAL_COMMUNITY): Payer: Medicare Other

## 2016-02-15 LAB — COMPREHENSIVE METABOLIC PANEL
ALT: 14 U/L (ref 14–54)
AST: 22 U/L (ref 15–41)
Albumin: 3.1 g/dL — ABNORMAL LOW (ref 3.5–5.0)
Alkaline Phosphatase: 42 U/L (ref 38–126)
Anion gap: 9 (ref 5–15)
BUN: 5 mg/dL — AB (ref 6–20)
CO2: 23 mmol/L (ref 22–32)
Calcium: 8.2 mg/dL — ABNORMAL LOW (ref 8.9–10.3)
Chloride: 106 mmol/L (ref 101–111)
Creatinine, Ser: 0.79 mg/dL (ref 0.44–1.00)
GFR calc Af Amer: >60 mL/min (ref >60)
Glucose, Bld: 106 mg/dL — ABNORMAL HIGH (ref 65–99)
POTASSIUM: 3.4 mmol/L — AB (ref 3.5–5.1)
Sodium: 138 mmol/L (ref 135–145)
Total Bilirubin: 0.8 mg/dL (ref 0.3–1.2)
Total Protein: 6.4 g/dL — ABNORMAL LOW (ref 6.5–8.1)

## 2016-02-15 LAB — CBC
HCT: 37.4 % (ref 36.0–46.0)
Hemoglobin: 12.4 g/dL (ref 12.0–15.0)
MCH: 31.2 pg (ref 26.0–34.0)
MCHC: 33.2 g/dL (ref 30.0–36.0)
MCV: 94 fL (ref 78.0–100.0)
PLATELETS: 147 10*3/uL — AB (ref 150–400)
RBC: 3.98 MIL/uL (ref 3.87–5.11)
RDW: 11.9 % (ref 11.5–15.5)
WBC: 2.8 10*3/uL — AB (ref 4.0–10.5)

## 2016-02-15 LAB — TROPONIN I

## 2016-02-15 LAB — MAGNESIUM: MAGNESIUM: 1.7 mg/dL (ref 1.7–2.4)

## 2016-02-15 LAB — PHOSPHORUS: PHOSPHORUS: 2.2 mg/dL — AB (ref 2.5–4.6)

## 2016-02-15 MED ORDER — DEXTROSE 5 % IV SOLN
2.0000 g | Freq: Once | INTRAVENOUS | Status: AC
Start: 2016-02-15 — End: 2016-02-15
  Administered 2016-02-15: 2 g via INTRAVENOUS
  Filled 2016-02-15: qty 2

## 2016-02-15 MED ORDER — METOPROLOL TARTRATE 5 MG/5ML IV SOLN
5.0000 mg | INTRAVENOUS | Status: DC | PRN
Start: 2016-02-15 — End: 2016-02-20
  Administered 2016-02-15 – 2016-02-17 (×2): 5 mg via INTRAVENOUS
  Filled 2016-02-15 (×3): qty 5

## 2016-02-15 NOTE — Significant Event (Signed)
Rapid Response Event Note  Overview:  Called by RN for patient with elevated BP 190's and chest pain Time Called: 0753 Arrival Time: 0800 Event Type: Cardiac  Initial Focused Assessment:  Called by RN for patient with chest pain and elevated SBP 190's.  On my arrival to patients room, EKG being obtained.  Patient states she has pressure in the center of her chest, no radiation to arm or jaw, and c/o nausea.   Interventions:  MD at bedside.  Orders given by MD  Plan of Care (if not transferred):  RN to monitor patient and follow through orders  Event Summary:  RN to call if needed   at      at          [facility], [place] Rain

## 2016-02-15 NOTE — Progress Notes (Addendum)
S: patient having sternal chest pain now. Mild nausea. Continues to tolerate clears, passing flatus  Vitals, labs, intake/output, and orders reviewed at this time. Hypertensive to 198 sbp  Gen: A&Ox3, uncomfortable H&N: EOMI, atraumatic, neck supple Chest: unlabored respirations, RRR Abd: soft, nontender, nondistended Ext: warm, no edema Neuro: grossly normal  Lines/tubes/drains: PIV  A/P:  70yo admitted with resolving pSBO -EKG unremarkable. Labs, CXR pending. O2, metoprolol, morphine -continue clears for now   Romana Juniper, MD [facility], [place] Pager [phone]   Addendum: chest pain resolved without intervention, EKG labs and CXR fine.

## 2016-02-16 LAB — URINE CULTURE

## 2016-02-16 MED ORDER — POTASSIUM PHOSPHATE MONOBASIC 500 MG PO TABS
500.0000 mg | ORAL_TABLET | Freq: Three times a day (TID) | ORAL | Status: AC
  Administered 2016-02-16 (×2): 500 mg via ORAL
  Filled 2016-02-16 (×4): qty 1

## 2016-02-16 NOTE — Care Management Important Message (Signed)
Important Message  Patient Details  Name: [name] MRN: [id number] Date of Birth: 1945/11/06   Medicare Important Message Given:  Yes    Arsenia Goracke [address] 02/16/2016, 11:26 AM

## 2016-02-16 NOTE — Progress Notes (Signed)
RT assessment protocol completed.  Pt does not take any maintenance medications at home for breathing, only Albuterol MDI every 6 hours as needed, and that is not very often. Pt has refused all but 1 administration of Dulera since admission. Dulera discontinued per pt request and RT assessment protocol. Will cont to monitor

## 2016-02-16 NOTE — Progress Notes (Signed)
Patient ID: [name], female   DOB: 1946/03/03, 70 y.o.   MRN: [id number]  [facility] Surgery Progress Note     Subjective: Minimal abdominal pain today, but she does have persistent nausea. No emesis. Passing flatus, no BM since 11/9.  Objective: Vital signs in last 24 hours: Temp:  [98.3 F (36.8 C)-99.7 F (37.6 C)] 99.5 F (37.5 C) (11/13 0525) Pulse Rate:  [69-84] 84 (11/13 0525) Resp:  [16-17] 16 (11/13 0525) BP: (129-192)/(60-95) 129/95 (11/13 0525) SpO2:  [96 %-97 %] 97 % (11/13 0525) Last BM Date: 02/12/16  Intake/Output from previous day: 11/12 0701 - 11/13 0700 In: 120 [P.O.:120] Out: -  Intake/Output this shift: No intake/output data recorded.  PE: Gen:  Alert, NAD, pleasant Card:  RRR, no M/G/R heard Pulm:  CTAB Abd: Soft, NT/ND, +BS, no HSM  Lab Results:   Recent Labs  02/14/16 0445 02/15/16 0902  WBC 2.9* 2.8*  HGB 12.6 12.4  HCT 38.3 37.4  PLT 140* 147*   BMET  Recent Labs  02/14/16 0445 02/15/16 0902  NA 138 138  K 3.9 3.4*  CL 106 106  CO2 24 23  GLUCOSE 94 106*  BUN 9 5*  CREATININE 0.75 0.79  CALCIUM 8.5* 8.2*   PT/INR No results for input(s): LABPROT, INR in the last 72 hours. CMP     Component Value Date/Time   NA 138 02/15/2016 0902   NA 142 06/26/2015 0849   K 3.4 (L) 02/15/2016 0902   K 3.7 06/26/2015 0849   CL 106 02/15/2016 0902   CL 106 06/19/2012 0936   CO2 23 02/15/2016 0902   CO2 29 06/26/2015 0849   GLUCOSE 106 (H) 02/15/2016 0902   GLUCOSE 96 06/26/2015 0849   GLUCOSE 93 06/19/2012 0936   BUN 5 (L) 02/15/2016 0902   BUN 16.7 06/26/2015 0849   CREATININE 0.79 02/15/2016 0902   CREATININE 0.9 06/26/2015 0849   CALCIUM 8.2 (L) 02/15/2016 0902   CALCIUM 8.8 06/26/2015 0849   PROT 6.4 (L) 02/15/2016 0902   PROT 6.9 06/26/2015 0849   ALBUMIN 3.1 (L) 02/15/2016 0902   ALBUMIN 3.1 (L) 06/26/2015 0849   AST 22 02/15/2016 0902   AST 23 06/26/2015 0849   ALT 14 02/15/2016 0902   ALT 16  06/26/2015 0849   ALKPHOS 42 02/15/2016 0902   ALKPHOS 56 06/26/2015 0849   BILITOT 0.8 02/15/2016 0902   BILITOT 0.43 06/26/2015 0849   GFRNONAA >60 02/15/2016 0902   GFRAA >60 02/15/2016 0902   Lipase     Component Value Date/Time   LIPASE 52 (H) 02/12/2016 1837       Studies/Results: Dg Chest Port 1 View  Result Date: 02/15/2016 CLINICAL DATA:  Chest pain EXAM: PORTABLE CHEST 1 VIEW COMPARISON:  September 21, 2013 FINDINGS: Mild atelectasis in the left base. Mild blunting of left costophrenic angle could represent a tiny effusion versus pleural thickening. Mild cardiomegaly. No other interval change or acute abnormality. IMPRESSION: Mild left basilar atelectasis. Small left pleural effusion versus pleural thickening. A PA and lateral chest x-ray could better evaluate if clinically warranted. No other changes. Electronically Signed   By: Dorise Bullion III M.D   On: 02/15/2016 08:37    Anti-infectives: Anti-infectives    Start     Dose/Rate Route Frequency Ordered Stop   02/15/16 1200  cefTRIAXone (ROCEPHIN) 2 g in dextrose 5 % 50 mL IVPB     2 g 100 mL/hr over 30 Minutes Intravenous  Once 02/15/16  1000 02/15/16 1541       Assessment/Plan SBO  - CT scan 11/10 Small bowel obstruction with transition zone in the right lower quadrant at the level of the anastomosis, possible anastomotic stricture. Giant hemangioma in the right lobe of the liver with smaller hemangioma in the left lobe of the liver. No evidence of developing metastatic disease. Nonspecific developing lucency in the L3 vertebra could represent Schmorl's node but destructive bone lesion should be excluded. Consider bone scan for further evaluation. - Abd XR 11/11 improving partial SBO - tolerating clears but she does have persistent nausea. No emesis. Last BM 11/9, she does continue to pass flatus  Chest pain - EKG unremarkable, troponins WNL, CXR Mild left basilar atelectasis and small left pleural effusion versus  pleural thickening - resolved today  Right colon cancer with mets to liver  - s/p right hemicolectomy and RFA of liver lesion at [facility] in 2011 - s/p chemotherapy with FOLFOX 6 and Avastin from 06/08/2009 through 12/19/2009 - Surgeon - Pecolia Ades, Oncology - [place] - in remission  UTI - given single dose of rocephin yesterday  Slightly low phosphorous and potassium - place  Leukocytopenia - chronic H/o right jugular DVT - port since removed in 2013, no longer on home blood thinners Nonspecific developing lucency in the L3 vertebra could represent Schmorl's node but destructive bone lesion should be excluded  FEN - full liquids VTE - Lovenox  Plan - clinically doing better but she does have persistent nausea, no emesis. Will advance to full liquid diet. Encourage more mobilization.   LOS: 3 days    Jerrye Beavers , [facility] Surgery 02/16/2016, 12:02 PM Pager: [phone] Consults: [phone] Mon-Fri 7:00 am-4:30 pm Sat-Sun 7:00 am-11:30 am

## 2016-02-17 ENCOUNTER — Inpatient Hospital Stay (HOSPITAL_COMMUNITY): Payer: Medicare Other

## 2016-02-17 LAB — BASIC METABOLIC PANEL
Anion gap: 9 (ref 5–15)
BUN: 6 mg/dL (ref 6–20)
CHLORIDE: 102 mmol/L (ref 101–111)
CO2: 24 mmol/L (ref 22–32)
CREATININE: 0.77 mg/dL (ref 0.44–1.00)
Calcium: 8.2 mg/dL — ABNORMAL LOW (ref 8.9–10.3)
GFR calc Af Amer: >60 mL/min (ref >60)
GLUCOSE: 140 mg/dL — AB (ref 65–99)
Potassium: 3.1 mmol/L — ABNORMAL LOW (ref 3.5–5.1)
SODIUM: 135 mmol/L (ref 135–145)

## 2016-02-17 LAB — MAGNESIUM: MAGNESIUM: 1.5 mg/dL — AB (ref 1.7–2.4)

## 2016-02-17 LAB — PHOSPHORUS: Phosphorus: 2.1 mg/dL — ABNORMAL LOW (ref 2.5–4.6)

## 2016-02-17 MED ORDER — CLONIDINE HCL 0.2 MG PO TABS
0.2000 mg | ORAL_TABLET | Freq: Two times a day (BID) | ORAL | Status: DC
  Administered 2016-02-17 – 2016-02-20 (×6): 0.2 mg via ORAL
  Filled 2016-02-17 (×6): qty 1

## 2016-02-17 MED ORDER — HYDRALAZINE HCL 20 MG/ML IJ SOLN
10.0000 mg | INTRAMUSCULAR | Status: DC | PRN
  Administered 2016-02-17 – 2016-02-20 (×7): 10 mg via INTRAVENOUS
  Filled 2016-02-17 (×7): qty 1

## 2016-02-17 MED ORDER — MAGNESIUM CHLORIDE 64 MG PO TBEC
1.0000 | DELAYED_RELEASE_TABLET | Freq: Two times a day (BID) | ORAL | Status: AC
  Administered 2016-02-17 (×2): 64 mg via ORAL
  Filled 2016-02-17 (×2): qty 1

## 2016-02-17 MED ORDER — CLONIDINE HCL 0.1 MG PO TABS
0.1000 mg | ORAL_TABLET | Freq: Once | ORAL | Status: AC
  Administered 2016-02-17: 0.1 mg via ORAL
  Filled 2016-02-17: qty 1

## 2016-02-17 MED ORDER — BISACODYL 10 MG RE SUPP
10.0000 mg | Freq: Once | RECTAL | Status: AC
  Administered 2016-02-17: 10 mg via RECTAL
  Filled 2016-02-17: qty 1

## 2016-02-17 MED ORDER — K PHOS MONO-SOD PHOS DI & MONO 155-852-130 MG PO TABS
500.0000 mg | ORAL_TABLET | Freq: Two times a day (BID) | ORAL | Status: AC
  Administered 2016-02-17 (×2): 500 mg via ORAL
  Filled 2016-02-17 (×2): qty 2

## 2016-02-17 NOTE — Progress Notes (Signed)
Patient BP high 190's/80's. Prn 5mg  IV Lopressor given without any results. BP still 190's/80's. MD on call paged this morning. Order for prn Hydralazine 10mg  IV prn given. First dose given at [phone]. BP rechecked and BP still elevated at 194/82. Report given to on coming RN. Will monitor BP, s/s, and give prn as needed.

## 2016-02-17 NOTE — Progress Notes (Signed)
Per report from night RN, PRN hydralazine and metoprolol given and BP still 199/82. Currently not time for daily BP meds or PRN meds so Dr. Dalbert Batman notified. Stated to give daily medications now, and that he would get a medicine consult. Will continue to monitor.

## 2016-02-17 NOTE — Progress Notes (Signed)
Patient ID: [name], female   DOB: May 12, 1945, 70 y.o.   MRN: [id number]  [facility] Surgery Progress Note     Subjective: Doing well this morning. Some persistent abdominal pain but she is no longer nauseated. Tolerating full liquids. Passing gas, no BM.   Objective: Vital signs in last 24 hours: Temp:  [98.7 F (37.1 C)-99.7 F (37.6 C)] 99.7 F (37.6 C) (11/14 0803) Pulse Rate:  [60-90] 83 (11/14 0803) Resp:  [16] 16 (11/14 0300) BP: (139-199)/(64-83) 199/82 (11/14 0620) SpO2:  [93 %-99 %] 99 % (11/14 0803) Last BM Date: 02/12/16  Intake/Output from previous day: 11/13 0701 - 11/14 0700 In: 1200 [I.V.:1200] Out: -  Intake/Output this shift: Total I/O In: 240 [P.O.:240] Out: -   PE: Gen:  Alert, NAD, pleasant Card:  RRR, no M/G/R heard Pulm:  CTAB Abd: Soft, ND, mild tenderness upper abdomen, +BS, no HSM  Lab Results:   Recent Labs  02/15/16 0902  WBC 2.8*  HGB 12.4  HCT 37.4  PLT 147*   BMET  Recent Labs  02/15/16 0902  NA 138  K 3.4*  CL 106  CO2 23  GLUCOSE 106*  BUN 5*  CREATININE 0.79  CALCIUM 8.2*   PT/INR No results for input(s): LABPROT, INR in the last 72 hours. CMP     Component Value Date/Time   NA 138 02/15/2016 0902   NA 142 06/26/2015 0849   K 3.4 (L) 02/15/2016 0902   K 3.7 06/26/2015 0849   CL 106 02/15/2016 0902   CL 106 06/19/2012 0936   CO2 23 02/15/2016 0902   CO2 29 06/26/2015 0849   GLUCOSE 106 (H) 02/15/2016 0902   GLUCOSE 96 06/26/2015 0849   GLUCOSE 93 06/19/2012 0936   BUN 5 (L) 02/15/2016 0902   BUN 16.7 06/26/2015 0849   CREATININE 0.79 02/15/2016 0902   CREATININE 0.9 06/26/2015 0849   CALCIUM 8.2 (L) 02/15/2016 0902   CALCIUM 8.8 06/26/2015 0849   PROT 6.4 (L) 02/15/2016 0902   PROT 6.9 06/26/2015 0849   ALBUMIN 3.1 (L) 02/15/2016 0902   ALBUMIN 3.1 (L) 06/26/2015 0849   AST 22 02/15/2016 0902   AST 23 06/26/2015 0849   ALT 14 02/15/2016 0902   ALT 16 06/26/2015 0849   ALKPHOS 42  02/15/2016 0902   ALKPHOS 56 06/26/2015 0849   BILITOT 0.8 02/15/2016 0902   BILITOT 0.43 06/26/2015 0849   GFRNONAA >60 02/15/2016 0902   GFRAA >60 02/15/2016 0902   Lipase     Component Value Date/Time   LIPASE 52 (H) 02/12/2016 1837       Studies/Results: No results found.  Anti-infectives: Anti-infectives    Start     Dose/Rate Route Frequency Ordered Stop   02/15/16 1200  cefTRIAXone (ROCEPHIN) 2 g in dextrose 5 % 50 mL IVPB     2 g 100 mL/hr over 30 Minutes Intravenous  Once 02/15/16 1000 02/15/16 1541       Assessment/Plan SBO  - CT scan 11/10 Small bowel obstruction with transition zone in the right lower quadrant at the level of the anastomosis, possible anastomotic stricture. Giant hemangioma in the right lobe of the liver with smaller hemangioma in the left lobe of the liver. No evidence of developing metastatic disease. Nonspecific developing lucency in the L3 vertebra could represent Schmorl's node but destructive bone lesion should be excluded. Consider bone scan for further evaluation. - Abd XR 11/11 improving partial SBO - tolerating full liquids, no longer nauseated  Chest pain  - EKG unremarkable, troponins WNL, CXR Mild left basilar atelectasis and small left pleural effusion versus pleural thickening - resolved  Right colon cancer with mets to liver  - s/p right hemicolectomy and RFA of liver lesion at [facility] in 2011 - s/pchemotherapy with FOLFOX 6 and Avastin from 06/08/2009 through 12/19/2009 - Surgeon - Pecolia Ades, Oncology - [place] - in remission  UTI - given single dose of rocephin  Leukocytopenia- chronic H/o right jugular DVT - port since removed in 2013, no longer on home blood thinners Nonspecific developing lucency in the L3 vertebra could represent Schmorl's node but destructive bone lesion should be excluded HTN - increased catapres to 0.2, continue hydralazine/metoprolol PRN  FEN - soft VTE - Lovenox  Plan - less pain  and nausea today, tolerating full liquids. Advance to soft diet. Continue to mobilize. Please give her a ducolax suppository. BMP/P/Mag pending. BP has been running high, will increase catapres to 0.2 and please continue metoprolol/hydralazine PRN.     LOS: 4 days    Jerrye Beavers , [facility] Surgery 02/17/2016, 9:37 AM Pager: [phone] Consults: [phone] Mon-Fri 7:00 am-4:30 pm Sat-Sun 7:00 am-11:30 am

## 2016-02-17 NOTE — Progress Notes (Signed)
Pt reporting of increased pain on L side that is not improved by PRN vicodin and morphine. There is an area on L side that feels firm and is tender to touch. Dulcolax suppository given earlier this morning. Dr. Rosendo Gros made aware, ordered 2 view KUB. Will pass on to oncoming RN and page with results per his request. Raquel James

## 2016-02-18 ENCOUNTER — Inpatient Hospital Stay (HOSPITAL_COMMUNITY): Payer: Medicare Other

## 2016-02-18 LAB — URINALYSIS, ROUTINE W REFLEX MICROSCOPIC
GLUCOSE, UA: NEGATIVE mg/dL
HGB URINE DIPSTICK: NEGATIVE
Ketones, ur: NEGATIVE mg/dL
Leukocytes, UA: NEGATIVE
Nitrite: NEGATIVE
PH: 7 (ref 5.0–8.0)
Protein, ur: NEGATIVE mg/dL
SPECIFIC GRAVITY, URINE: 1.024 (ref 1.005–1.030)

## 2016-02-18 LAB — CBC
HEMATOCRIT: 37.8 % (ref 36.0–46.0)
HEMOGLOBIN: 13 g/dL (ref 12.0–15.0)
MCH: 31.6 pg (ref 26.0–34.0)
MCHC: 34.4 g/dL (ref 30.0–36.0)
MCV: 91.7 fL (ref 78.0–100.0)
Platelets: 140 10*3/uL — ABNORMAL LOW (ref 150–400)
RBC: 4.12 MIL/uL (ref 3.87–5.11)
RDW: 11.9 % (ref 11.5–15.5)
WBC: 3.4 10*3/uL — ABNORMAL LOW (ref 4.0–10.5)

## 2016-02-18 LAB — BASIC METABOLIC PANEL
Anion gap: 10 (ref 5–15)
BUN: 8 mg/dL (ref 6–20)
CHLORIDE: 106 mmol/L (ref 101–111)
CO2: 24 mmol/L (ref 22–32)
Calcium: 8.4 mg/dL — ABNORMAL LOW (ref 8.9–10.3)
Creatinine, Ser: 0.91 mg/dL (ref 0.44–1.00)
GFR calc Af Amer: >60 mL/min (ref >60)
GFR calc non Af Amer: >60 mL/min (ref >60)
GLUCOSE: 113 mg/dL — AB (ref 65–99)
POTASSIUM: 2.7 mmol/L — AB (ref 3.5–5.1)
Sodium: 140 mmol/L (ref 135–145)

## 2016-02-18 LAB — MAGNESIUM: MAGNESIUM: 1.7 mg/dL (ref 1.7–2.4)

## 2016-02-18 MED ORDER — POLYETHYLENE GLYCOL 3350 17 G PO PACK
17.0000 g | PACK | Freq: Every day | ORAL | Status: DC
  Administered 2016-02-18 – 2016-02-19 (×2): 17 g via ORAL
  Filled 2016-02-18 (×3): qty 1

## 2016-02-18 MED ORDER — SODIUM CHLORIDE 0.9% FLUSH
10.0000 mL | INTRAVENOUS | Status: DC | PRN
  Administered 2016-02-19: 10 mL
  Filled 2016-02-18: qty 40

## 2016-02-18 MED ORDER — SENNOSIDES-DOCUSATE SODIUM 8.6-50 MG PO TABS
1.0000 | ORAL_TABLET | Freq: Two times a day (BID) | ORAL | Status: DC
  Administered 2016-02-18 – 2016-02-20 (×5): 1 via ORAL
  Filled 2016-02-18 (×5): qty 1

## 2016-02-18 MED ORDER — POTASSIUM CHLORIDE CRYS ER 20 MEQ PO TBCR
30.0000 meq | EXTENDED_RELEASE_TABLET | Freq: Three times a day (TID) | ORAL | Status: DC
  Administered 2016-02-18 – 2016-02-19 (×4): 30 meq via ORAL
  Filled 2016-02-18 (×4): qty 1

## 2016-02-18 NOTE — Progress Notes (Signed)
CRITICAL VALUE ALERT  Critical value received:  Potassium 2.7  Date of notification:  11/15  Time of notification: [id number]  Critical value read back:Yes.    Nurse who received alert:  Ethelda Chick  MD notified (1st page):  Dr. Dalbert Batman  Time of first page:  934  MD notified (2nd page): Dr. Dalbert Batman- in [place], will page PA Will per his request  Time of second page: 938  Responding MD:  Modena Jansky PA  Time MD responded:  940

## 2016-02-18 NOTE — Progress Notes (Signed)
Patient's results for the KUB test was completed and ready for review last night around 2030.  MD, notified and saw no acute changes.

## 2016-02-18 NOTE — Progress Notes (Signed)
Surgery:  Stable. C/o L. Flank pain.  This may be chronic from her shingles, which is a chronic problem but she's no clear. tol full liqs. Passing flatus.  No stool Abdomen fairly benign  Plan: MiraLAX Chest x-ray CBC Urinalysis Soft diet  Edsel Petrin. Dalbert Batman, M.D., [facility] General and Minimally invasive Surgery Breast and Colorectal Surgery Office:   [phone] Pager:   [phone]

## 2016-02-19 LAB — BASIC METABOLIC PANEL
ANION GAP: 6 (ref 5–15)
BUN: 7 mg/dL (ref 6–20)
CALCIUM: 8.3 mg/dL — AB (ref 8.9–10.3)
CO2: 23 mmol/L (ref 22–32)
Chloride: 109 mmol/L (ref 101–111)
Creatinine, Ser: 0.78 mg/dL (ref 0.44–1.00)
GFR calc Af Amer: >60 mL/min (ref >60)
GLUCOSE: 93 mg/dL (ref 65–99)
POTASSIUM: 3.9 mmol/L (ref 3.5–5.1)
SODIUM: 138 mmol/L (ref 135–145)

## 2016-02-19 MED ORDER — POLYETHYLENE GLYCOL 3350 17 G PO PACK
17.0000 g | PACK | ORAL | Status: AC
  Administered 2016-02-19 – 2016-02-20 (×4): 17 g via ORAL
  Filled 2016-02-19 (×4): qty 1

## 2016-02-19 MED ORDER — HYDROCODONE-ACETAMINOPHEN 5-325 MG PO TABS
1.0000 | ORAL_TABLET | ORAL | Status: DC | PRN
  Administered 2016-02-19 – 2016-02-20 (×5): 1 via ORAL
  Filled 2016-02-19 (×6): qty 1

## 2016-02-19 MED ORDER — GABAPENTIN 600 MG PO TABS
600.0000 mg | ORAL_TABLET | Freq: Three times a day (TID) | ORAL | Status: DC
  Administered 2016-02-19 – 2016-02-20 (×4): 600 mg via ORAL
  Filled 2016-02-19 (×4): qty 1

## 2016-02-19 MED ORDER — MINERAL OIL RE ENEM
1.0000 | ENEMA | Freq: Once | RECTAL | Status: AC
  Administered 2016-02-19: 1 via RECTAL
  Filled 2016-02-19: qty 1

## 2016-02-19 NOTE — Progress Notes (Signed)
Patient ID: [name], female   DOB: 1945-07-03, 70 y.o.   MRN: [id number]  [facility] Surgery Progress Note     Subjective: Persistent left flank pain, patient describes as stabbing/stinging. Restarted neurontin this AM which patient reports did somewhat help. She is tolerating her diet. Minimal BM after suppository. Ambulating well. BP elevated again this AM requiring hydralazin. Patient denies CP, SOB, headache, blurry vision.   Objective: Vital signs in last 24 hours: Temp:  [98.7 F (37.1 C)-98.9 F (37.2 C)] 98.9 F (37.2 C) (11/16 0418) Pulse Rate:  [56-90] 66 (11/16 1007) Resp:  [16] 16 (11/16 0418) BP: (129-191)/(56-82) 179/77 (11/16 1007) SpO2:  [97 %-98 %] 98 % (11/16 0418) Last BM Date: 02/18/16  Intake/Output from previous day: 11/15 0701 - 11/16 0700 In: 2680 [P.O.:480; I.V.:2200] Out: 500 [Urine:500] Intake/Output this shift: Total I/O In: 240 [P.O.:240] Out: -   PE: Gen:  Alert, NAD, pleasant Card:  Irregularly irregular rhythm, regular rate Pulm:  CTAB Abd: Soft, NT/ND, +BS, no HSM  Lab Results:   Recent Labs  02/18/16 0836  WBC 3.4*  HGB 13.0  HCT 37.8  PLT 140*   BMET  Recent Labs  02/18/16 0836 02/19/16 0437  NA 140 138  K 2.7* 3.9  CL 106 109  CO2 24 23  GLUCOSE 113* 93  BUN 8 7  CREATININE 0.91 0.78  CALCIUM 8.4* 8.3*   PT/INR No results for input(s): LABPROT, INR in the last 72 hours. CMP     Component Value Date/Time   NA 138 02/19/2016 0437   NA 142 06/26/2015 0849   K 3.9 02/19/2016 0437   K 3.7 06/26/2015 0849   CL 109 02/19/2016 0437   CL 106 06/19/2012 0936   CO2 23 02/19/2016 0437   CO2 29 06/26/2015 0849   GLUCOSE 93 02/19/2016 0437   GLUCOSE 96 06/26/2015 0849   GLUCOSE 93 06/19/2012 0936   BUN 7 02/19/2016 0437   BUN 16.7 06/26/2015 0849   CREATININE 0.78 02/19/2016 0437   CREATININE 0.9 06/26/2015 0849   CALCIUM 8.3 (L) 02/19/2016 0437   CALCIUM 8.8 06/26/2015 0849   PROT 6.4 (L)  02/15/2016 0902   PROT 6.9 06/26/2015 0849   ALBUMIN 3.1 (L) 02/15/2016 0902   ALBUMIN 3.1 (L) 06/26/2015 0849   AST 22 02/15/2016 0902   AST 23 06/26/2015 0849   ALT 14 02/15/2016 0902   ALT 16 06/26/2015 0849   ALKPHOS 42 02/15/2016 0902   ALKPHOS 56 06/26/2015 0849   BILITOT 0.8 02/15/2016 0902   BILITOT 0.43 06/26/2015 0849   GFRNONAA >60 02/19/2016 0437   GFRAA >60 02/19/2016 0437   Lipase     Component Value Date/Time   LIPASE 52 (H) 02/12/2016 1837       Studies/Results: Dg Chest 2 View  Result Date: 02/18/2016 CLINICAL DATA:  Left flank pain for few days,very weak,some sob EXAM: CHEST - 2 VIEW COMPARISON:  02/15/2016 and prior studies FINDINGS: Persistent blunting of left lateral costophrenic angle since 09/21/2013, with adjacent subsegmental atelectasis or consolidation in the lateral basal segment left lower lobe. Right lung clear. Heart size upper limits normal. No pneumothorax. Degenerative changes in bilateral shoulders. Anterior vertebral endplate spurring at multiple levels in the mid thoracic spine. IMPRESSION: 1. Stable probably chronic changes at the left lung base. No acute findings. Electronically Signed   By: Lucrezia Europe M.D.   On: 02/18/2016 10:30   Dg Abd 2 Views  Result Date: 02/17/2016 CLINICAL DATA:  70 y/o F; left upper to mid abdominal pain for 5 days and constipation. EXAM: ABDOMEN - 2 VIEW COMPARISON:  02/14/2016 abdomen radiograph FINDINGS: Normal bowel gas pattern. Small amount of stool invading the descending colon. No pneumoperitoneum. Blunted costal diaphragmatic angles, left greater than right may represent small pleural effusions. Multilevel degenerative changes of the spine. No acute osseous abnormality is evident. IMPRESSION: Normal bowel gas pattern.  Possible small pleural effusions. Electronically Signed   By: Kristine Garbe M.D.   On: 02/17/2016 20:15    Anti-infectives: Anti-infectives    Start     Dose/Rate Route Frequency  Ordered Stop   02/15/16 1200  cefTRIAXone (ROCEPHIN) 2 g in dextrose 5 % 50 mL IVPB     2 g 100 mL/hr over 30 Minutes Intravenous  Once 02/15/16 1000 02/15/16 1541       Assessment/Plan SBO  - CT scan 11/10 Small bowel obstruction with transition zone in the right lower quadrant at the level of the anastomosis, possible anastomotic stricture. Giant hemangioma in the right lobe of the liver with smaller hemangioma in the left lobe of the liver. No evidence of developing metastatic disease. Nonspecific developing lucency in the L3 vertebra could represent Schmorl's node but destructive bone lesion should be excluded. Consider bone scan for further evaluation. - Abd XR 11/11 improving partial SBO - tolerating soft diet, no BM x1 week - L flank pain may now be more related to pain from shingles  Chest pain  - EKG unremarkable, troponins WNL, CXR Mild left basilar atelectasis and small left pleural effusion versus pleural thickening - resolved  Right colon cancer with mets to liver  - s/p right hemicolectomy and RFA of liver lesion at [facility] in 2011 - s/pchemotherapy with FOLFOX 6 and Avastin from 06/08/2009 through 12/19/2009 - Surgeon - Pecolia Ades, Oncology - [place] - in remission  UTI - given single dose of rocephin  Leukocytopenia- chronic H/o right jugular DVT - port since removed in 2013, no longer on home blood thinners Nonspecific developing lucency in the L3 vertebra could represent Schmorl's node but destructive bone lesion should be excluded HTN - increased catapres to 0.2 and HTN was more stable yesterday but increased this morning  FEN - soft VTE - Lovenox  Plan - fleet enema today. Encourage mobilization. Advance to regular diet. D/c IV fluids. neurontin added back to medication regimen. D/c morphine, increased frequency of norco to q4 hours. Check BP q2 hours. I will recheck her this afternoon.   LOS: 6 days    Jerrye Beavers , [facility]  Surgery 02/19/2016, 11:08 AM Pager: [phone] Consults: [phone] Mon-Fri 7:00 am-4:30 pm Sat-Sun 7:00 am-11:30 am

## 2016-02-19 NOTE — Progress Notes (Signed)
Patient ID: [name], female   DOB: 1946-03-23, 70 y.o.   MRN: [id number]  I did recheck Ms. Lips this afternoon. Her abdominal exam is benign Her left flank pain has continued to improve throughout the day after receiving gabapentin, but has not completely resolved. She had a small BM after enema. Her BP has also come down to 146/61.  She is going to stay tonight and we will see her again in the morning. As long as her pain continues to improve and BP remains stable she will be ready for discharge tomorrow. She will need close follow-up with PCP regarding HTN.  BROOKE A MILLER

## 2016-02-20 ENCOUNTER — Inpatient Hospital Stay (HOSPITAL_COMMUNITY): Payer: Medicare Other

## 2016-02-20 ENCOUNTER — Encounter (HOSPITAL_COMMUNITY): Payer: Self-pay | Admitting: General Surgery

## 2016-02-20 DIAGNOSIS — I10 Essential (primary) hypertension: Secondary | ICD-10-CM

## 2016-02-20 HISTORY — DX: Essential (primary) hypertension: I10

## 2016-02-20 LAB — CREATININE, SERUM
Creatinine, Ser: 0.74 mg/dL (ref 0.44–1.00)
GFR calc non Af Amer: >60 mL/min (ref >60)

## 2016-02-20 MED ORDER — IOPAMIDOL (ISOVUE-370) INJECTION 76%
100.0000 mL | Freq: Once | INTRAVENOUS | Status: AC | PRN
  Administered 2016-02-20: 100 mL via INTRAVENOUS

## 2016-02-20 MED ORDER — HYDROCODONE-ACETAMINOPHEN 5-325 MG PO TABS: 1.0000 | ORAL_TABLET | ORAL | 0 refills | Status: DC | PRN

## 2016-02-20 MED ORDER — IOPAMIDOL (ISOVUE-370) INJECTION 76%
INTRAVENOUS | Status: AC
  Filled 2016-02-20: qty 100

## 2016-02-20 NOTE — Progress Notes (Signed)
Complains more bitterly of left flank pain today.  Nonpleuritic.  No cough. Physical exam unchanged She says if we send her home she will just have to come right back  Proceed with CT angio chest to rule out pulmonary embolism., although doubt  Micael Barb M. Dalbert Batman, M.D., [facility] General and Minimally invasive Surgery Breast and Colorectal Surgery

## 2016-02-20 NOTE — Care Management Important Message (Signed)
Important Message  Patient Details  Name: [name] MRN: [id number] Date of Birth: 1945/06/07   Medicare Important Message Given:  Yes    Gerod Caligiuri Montine Circle 02/20/2016, 3:12 PM

## 2016-02-20 NOTE — Progress Notes (Signed)
Subjective: Pt is still complaining of left sided flank pain. 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 She states the pain is unchanged and be occurring for roughly 1+ year. Pt endorses 3 BM's today. No abdominal pain, nausea or vomiting. Pain is well controlled on her pain regiment.   Pt states she is not comfortable going home as she lives alone.   Objective: Vital signs in last 24 hours: Temp:  [98.2 F (36.8 C)-98.7 F (37.1 C)] 98.7 F (37.1 C) (11/17 0629) Pulse Rate:  [59-96] 61 (11/17 0426) Resp:  [16-20] 20 (11/17 0426) BP: (146-173)/(61-76) 157/74 (11/17 0629) SpO2:  [96 %-100 %] 100 % (11/17 0426) Last BM Date: 02/19/16  Intake/Output from previous day: 11/16 0701 - 11/17 0700 In: 1380 [P.O.:1370; I.V.:10] Out: -  Intake/Output this shift: Total I/O In: -  Out: 5 [Urine:2; Stool:3]  General appearance: alert, cooperative, no distress and Pleasant Back: range of motion normal, no echymosis, or deformities, no TTP to L spine Resp: clear to auscultation bilaterally and normal effort Cardio: regular rate and rhythm and S1, S2 normal GI: soft, non-tender; bowel sounds normal, no distention Extremities: no edema of BLE Pulses: radial and DP pulses 2+ and symmetric  Lab Results:   Recent Labs  02/18/16 0836  WBC 3.4*  HGB 13.0  HCT 37.8  PLT 140*    BMET  Recent Labs  02/18/16 0836 02/19/16 0437 02/20/16 0516  NA 140 138  --   K 2.7* 3.9  --   CL 106 109  --   CO2 24 23  --   GLUCOSE 113* 93  --   BUN 8 7  --   CREATININE 0.91 0.78 0.74  CALCIUM 8.4* 8.3*  --    PT/INR No results for input(s): LABPROT, INR in the last 72 hours.   Recent Labs Lab 02/15/16 0902  AST 22  ALT 14  ALKPHOS 42  BILITOT 0.8  PROT 6.4*  ALBUMIN 3.1*     Lipase     Component Value Date/Time   LIPASE 52 (H) 02/12/2016 1837     Studies/Results: Ct Angio Chest Pe W Or Wo Contrast  Result Date:  02/20/2016 CLINICAL DATA:  Chest pain and shortness of breath.  Colon cancer. EXAM: CT ANGIOGRAPHY CHEST WITH CONTRAST TECHNIQUE: Multidetector CT imaging of the chest was performed using the standard protocol during bolus administration of intravenous contrast. Multiplanar CT image reconstructions and MIPs were obtained to evaluate the vascular anatomy. CONTRAST:  100 cc Isovue 370 COMPARISON:  Chest radiographs dated 02/18/2016. Chest, abdomen and pelvis CT dated 07/03/2015. FINDINGS: Cardiovascular: Normally opacified pulmonary arteries with no pulmonary arterial filling defects seen. Small, focal areas of coronary artery calcification. Minimal aortic calcification. Borderline enlarged heart. Mediastinum/Nodes: Enlarged right hilar lymph node with a short axis diameter of 12.5 mm on image number 64 of series 4. This previously had a corresponding diameter of 10.6 mm. A previously demonstrated 11.6 mm short axis diameter subcarinal node has a corresponding diameter of 11.0 mm on image number 61 of series 4. Additional mildly prominent mediastinal nodes without significant change. Lungs/Pleura: Mild bilateral bullous changes. Mild atelectasis or scarring at both lung bases. No lung nodules. Upper Abdomen: Large right lobe liver mass measuring 13.4 x 7.7 on image number 125 of series 4. This previously measured 14.1 x 6.8 cm in corresponding dimensions. This previously demonstrated peripheral contrast puddling. The previously demonstrated 2.8 x 2.2 cm treated metastasis  in the left lobe of the liver currently measures 2.5 x 1.9 cm in corresponding dimensions. Musculoskeletal: Thoracic spine degenerative changes. Review of the MIP images confirms the above findings. IMPRESSION: 1. No pulmonary emboli or acute abnormality. 2. Mild increase in size of a possible metastatic right hilar lymph node without significant change and a mildly enlarged subcarinal lymph node. 3. Mild changes of COPD. 4. Further decrease in size  of the treated metastasis in the left lobe of the liver. 5. Stable large right lobe liver cavernous hemangioma. 6. Mild coronary artery atherosclerotic calcifications. 7. Minimal aortic atherosclerosis. Electronically Signed   By: Claudie Revering M.D.   On: 02/20/2016 11:11    Medications: . cloNIDine  0.2 mg Oral BID  . diltiazem  180 mg Oral Daily  . enoxaparin (LOVENOX) injection  40 mg Subcutaneous Q24H  . gabapentin  600 mg Oral TID  . losartan  100 mg Oral Daily   And  . hydrochlorothiazide  12.5 mg Oral Daily  . iopamidol      . levETIRAcetam  500 mg Oral BID  . PARoxetine  20 mg Oral Daily  . polyethylene glycol  17 g Oral Daily  . senna-docusate  1 tablet Oral BID    Assessment/Plan SBO  - CT scan 11/10 Small bowel obstruction with transition zone in the right lower quadrant at the level of the anastomosis, possible anastomotic stricture. Giant hemangioma in the right lobe of the liver with smaller hemangioma in the left lobe of the liver. No evidence of developing metastatic disease. Nonspecific developing lucency in the L3 vertebra could represent Schmorl's node but destructive bone lesion should be excluded. Consider bone scan for further evaluation. - Abd XR 11/11 improving partial SBO - tolerating soft diet, small BM after enema - L flank pain may now be more related to pain from shingles, Dr. Dalbert Batman ordered CT angio to r/u PE which was negative for PE.  Chest pain  - EKG unremarkable, troponins WNL, CXR Mild left basilar atelectasis and small left pleural effusion versus pleural thickening - resolved  Right colon cancer with mets to liver  - s/p right hemicolectomy and RFA of liver lesion at [facility] in 2011 - s/pchemotherapy with FOLFOX 6 and Avastin from 06/08/2009 through 12/19/2009 - Surgeon - Pecolia Ades, Oncology - [place] - in remission  UTI - given single dose of rocephin  Leukocytopenia- chronic H/o right jugular DVT - port since removed in 2013, no  longer on home blood thinners Nonspecific developing lucency in the L3 vertebra could represent Schmorl's node but destructive bone lesion should not be excluded HTN - catapres at 0.2 vs home dose of 0.1 Hx of Seizures: On home Keppra   FEN - soft VTE - Lovenox  Plan - Multiple BM's today. Tolerating regular diet without N or V. Encourage mobilization. Neurontin added back to medication regimen. Norco q4 hours. Check BP q2 hours. CT chest angio negative for PE. Pt states her left sided flank pain is chronic and unchanged. 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 Informed pt of her incidental findings on CT of her L3 vertebra. Instructed pt to f/u with PCP or oncologist within 1 week for bone scan.    LOS: 7 days    Edson Snowball General Surgery [phone] 02/20/2016

## 2016-02-20 NOTE — Progress Notes (Signed)
Discharge instructions given. Pt verbalized understanding and all questions were answered.  

## 2016-02-20 NOTE — Discharge Summary (Signed)
Physician Discharge Summary  Patient ID: [name] MRN: [id number] DOB/AGE: 06/21/1945 70 y.o.  Admit date: 02/12/2016 Discharge date: 02/20/2016  Admission Diagnoses:   Partial small bowel obstruction Hx of right colon cancer with liver metastasis s/p hemicolectomy and Chemotherapy Chronic leukopenia Hx of right jugular DVT - off anticoagulation Nonspecific developing lucency in the L3 vertebra  Hypertension Hx of shingles Hx of seizures   Discharge Diagnoses:  Partial small bowel obstruction Hx of right colon cancer with liver metastasis s/p hemicolectomy and Chemotherapy Chronic leukopenia Hx of right jugular DVT - off anticoagulation Nonspecific developing lucency in the L3 vertebra  Hypertension Hx of shingles Hx of seizures Hypokalemia - resolved UTI   Principal Problem:   Small bowel obstruction Active Problems:   Essential hypertension   History of colon cancer, stage IV   Generalized convulsive epilepsy ([place])   PROCEDURES: None  Hospital Course: This is a 69 yo female with a history of right colon cancer with mets to the liver s/p right hemicolectomy and RFA of liver lesion at [facility] in 2011.  Surgeon - Pecolia Ades, Oncology  - [place].  She had been doing well but over the last couple of weeks, she has developed some vague left-sided abdominal pain.  Yesterday, she developed abdominal distention, nausea, vomiting x 2.  She had a normal bowel movement yesterday and continues to pass occasional flatus.  She feels much better after vomiting.  Pt admitted and placed on bowel rest, IV hydration.  She made slow progress and her diet was slowly advanced. She was treated for elevated blood pressures, UTI, and hypokalemia.  She had persistent flank pain we thought related to her shingles and neuropathy.  Films and Ct of the chest were unremarkable, no Pulmonary embolism.  By 02/20/16 she was doing well.  She was very anxious about going home.  We did increase  her Clonidine here in the hospital, but we are sending her home on her home Medications as before admission.  She is to follow up with her PCP for her home meds and Dr. Alen Blew about the L3 lucency.  She can see [place] again if needed.  She is still complaining of flank pain and she is going to see her PCP next week.  We gave her some Vicodin for use at home.   CT ANGIO CHEST PE W OR WO CONTRAST :  1. No pulmonary emboli or acute abnormality. 2. Mild increase in size of a possible metastatic right hilar lymph node without significant change and a mildly enlarged subcarinal lymph node. 3. Mild changes of COPD.  4. Further decrease in size of the treated metastasis in the left lobe of the liver. 5. Stable large right lobe liver cavernous hemangioma. 6. Mild coronary artery atherosclerotic calcifications. 7. Minimal aortic atherosclerosis.  CBC Latest Ref Rng & Units 02/18/2016 02/15/2016 02/14/2016  WBC 4.0 - 10.5 K/uL 3.4(L) 2.8(L) 2.9(L)  Hemoglobin 12.0 - 15.0 g/dL 13.0 12.4 12.6  Hematocrit 36.0 - 46.0 % 37.8 37.4 38.3  Platelets 150 - 400 K/uL 140(L) 147(L) 140(L)   CMP Latest Ref Rng & Units 02/20/2016 02/19/2016 02/18/2016  Glucose 65 - 99 mg/dL - 93 113(H)  BUN 6 - 20 mg/dL - 7 8  Creatinine 0.44 - 1.00 mg/dL 0.74 0.78 0.91  Sodium 135 - 145 mmol/L - 138 140  Potassium 3.5 - 5.1 mmol/L - 3.9 2.7(LL)  Chloride 101 - 111 mmol/L - 109 106  CO2 22 - 32 mmol/L - 23 24  Calcium 8.9 - 10.3 mg/dL - 8.3(L) 8.4(L)  Total Protein 6.5 - 8.1 g/dL - - -  Total Bilirubin 0.3 - 1.2 mg/dL - - -  Alkaline Phos 38 - 126 U/L - - -  AST 15 - 41 U/L - - -  ALT 14 - 54 U/L - - -    Condition on D/c:  improved   Disposition: 01-Home or Self Care     Medication List    TAKE these medications   albuterol 108 (90 Base) MCG/ACT inhaler Commonly known as:  PROVENTIL HFA;VENTOLIN HFA Inhale 2 puffs into the lungs every 6 (six) hours as needed for wheezing or shortness of breath.   aspirin EC 81 MG  tablet Take 81 mg by mouth daily.   calcium-vitamin D 500-200 MG-UNIT tablet Commonly known as:  OSCAL WITH D Take 1 tablet by mouth.   CENTRUM SILVER ADULT 50+ PO Take 1 tablet by mouth daily.   cetirizine 10 MG tablet Commonly known as:  ZYRTEC Take 10 mg by mouth at bedtime.   cloNIDine 0.1 MG tablet Commonly known as:  CATAPRES Take 0.1 mg by mouth 2 (two) times daily.   diltiazem 180 MG 24 hr capsule Commonly known as:  CARDIZEM CD Take 180 mg by mouth daily.   ferrous sulfate 325 (65 FE) MG tablet Take 325 mg by mouth 3 (three) times daily with meals.   Fluticasone-Salmeterol 100-50 MCG/DOSE Aepb Commonly known as:  ADVAIR Inhale 2 puffs into the lungs 2 (two) times daily as needed (wheezing).   furosemide 20 MG tablet Commonly known as:  LASIX Take 20 mg by mouth daily.   gabapentin 600 MG tablet Commonly known as:  NEURONTIN Take 600 mg by mouth 3 (three) times daily.   HYDROcodone-acetaminophen 5-325 MG tablet Commonly known as:  NORCO/VICODIN Take 1 tablet by mouth every 4 (four) hours as needed for moderate pain.   levETIRAcetam 500 MG tablet Commonly known as:  KEPPRA Take 1 tablet (500 mg total) by mouth 2 (two) times daily.   losartan-hydrochlorothiazide 100-12.5 MG tablet Commonly known as:  HYZAAR Take 1 tablet by mouth daily.   meloxicam 15 MG tablet Commonly known as:  MOBIC Take 15 mg by mouth daily.   PARoxetine 20 MG tablet Commonly known as:  PAXIL Take 20 mg by mouth daily.   polyethylene glycol packet Commonly known as:  MIRALAX / GLYCOLAX Take 17 g by mouth daily as needed for mild constipation.   potassium chloride SA 20 MEQ tablet Commonly known as:  K-DUR,KLOR-CON Take 20 mEq by mouth daily.   traZODone 50 MG tablet Commonly known as:  DESYREL Take 50 mg by mouth at bedtime.      Follow-up Information    Javier Docker, MD. Schedule an appointment as soon as possible for a visit.   Specialty:  Internal  Medicine Why:  1 week to check home meds, discuss elevated BP, and discuss findings on CT of lumbar spine for further workup Contact information: [address] [place] [address] [phone]        [facility], [place] Follow up.   Specialty:  General Surgery Why:  Call for follow-up if needed Contact information: [address] [place] [phone]       [facility], MD. Schedule an appointment as soon as possible for a visit in 1 week(s).   Specialty:  Oncology Why:  for follow up and to discuss the incidental  findings on the CT scan regarding your lumbar spine and enlarged lymph node in your chest that may require further testing or imaging. Contact information: [place] [address] [phone]           Signed: Earnstine Regal 02/20/2016, 3:26 PM

## 2016-02-20 NOTE — Discharge Instructions (Signed)
Soft-Food Meal Plan for one week A soft-food meal plan includes foods that are safe and easy to swallow. This meal plan typically is used:  If you are having trouble chewing or swallowing foods.  As a transition meal plan after only having had liquid meals for a long period. What do I need to know about the soft-food meal plan? A soft-food meal plan includes tender foods that are soft and easy to chew and swallow. In most cases, bite-sized pieces of food are easier to swallow. A bite-sized piece is about  inch or smaller. Foods in this plan do not need to be ground or pureed. Foods that are very hard, crunchy, or sticky should be avoided. Also, breads, cereals, yogurts, and desserts with nuts, seeds, or fruits should be avoided. What foods can I eat? Grains  Rice and wild rice. Moist bread, dressing, pasta, and noodles. Well-moistened dry or cooked cereals, such as farina (cooked wheat cereal), oatmeal, or grits. Biscuits, breads, muffins, pancakes, and waffles that have been well moistened. Vegetables  Shredded lettuce. Cooked, tender vegetables, including potatoes without skins. Vegetable juices. Broths or creamed soups made with vegetables that are not stringy or chewy. Strained tomatoes (without seeds). Fruits  Canned or well-cooked fruits. Soft (ripe), peeled fresh fruits, such as peaches, nectarines, kiwi, cantaloupe, honeydew melon, and watermelon (without seeds). Soft berries with small seeds, such as strawberries. Fruit juices (without pulp). Meats and Other Protein Sources  Moist, tender, lean beef. Mutton. Lamb. Veal. Chicken. [place]. Liver. Ham. Fish without bones. Eggs. Dairy  Milk, milk drinks, and cream. Plain cream cheese and cottage cheese. Plain yogurt. Sweets/Desserts  Flavored gelatin desserts. Custard. Plain ice cream, frozen yogurt, sherbet, milk shakes, and malts. Plain cakes and cookies. Plain hard candy. Other  Butter, margarine (without trans fat), and cooking oils.  Mayonnaise. Cream sauces. Mild spices, salt, and sugar. Syrup, molasses, honey, and jelly. The items listed above may not be a complete list of recommended foods or beverages. Contact your dietitian for more options.  What foods are not recommended? Grains  Dry bread, toast, crackers that have not been moistened. Coarse or dry cereals, such as bran, granola, and shredded wheat. Tough or chewy crusty breads, such as [place] bread or baguettes. Vegetables  Corn. Raw vegetables except shredded lettuce. Cooked vegetables that are tough or stringy. Tough, crisp, fried potatoes and potato skins. Fruits  Fresh fruits with skins or seeds or both, such as apples, pears, or grapes. Stringy, high-pulp fruits, such as papaya, pineapple, coconut, or mango. Fruit leather, fruit roll-ups, and all dried fruits. Meats and Other Protein Sources  Sausages and hot dogs. Meats with gristle. Fish with bones. Nuts, seeds, and chunky peanut or other nut butters. Sweets/Desserts  Cakes or cookies that are very dry or chewy. The items listed above may not be a complete list of foods and beverages to avoid. Contact your dietitian for more information.  This information is not intended to replace advice given to you by your health care provider. Make sure you discuss any questions you have with your health care provider. Document Released: 06/29/2007 Document Revised: 08/28/2015 Document Reviewed: 02/16/2013 Elsevier Interactive Patient Education  2017 Elsevier Inc.   High-Fiber Diet after one week of soft diet Fiber, also called dietary fiber, is a type of carbohydrate found in fruits, vegetables, whole grains, and beans. A high-fiber diet can have many health benefits. Your health care provider may recommend a high-fiber diet to help:  Prevent constipation. Fiber can make your  bowel movements more regular.  Lower your cholesterol.  Relieve hemorrhoids, uncomplicated diverticulosis, or irritable bowel  syndrome.  Prevent overeating as part of a weight-loss plan.  Prevent heart disease, type 2 diabetes, and certain cancers. What is my plan? The recommended daily intake of fiber includes:  38 grams for men under age 49.  33 grams for men over age 43.  58 grams for women under age 55.  51 grams for women over age 71. You can get the recommended daily intake of dietary fiber by eating a variety of fruits, vegetables, grains, and beans. Your health care provider may also recommend a fiber supplement if it is not possible to get enough fiber through your diet. What do I need to know about a high-fiber diet?  Fiber supplements have not been widely studied for their effectiveness, so it is better to get fiber through food sources.  Always check the fiber content on thenutrition facts label of any prepackaged food. Look for foods that contain at least 5 grams of fiber per serving.  Ask your dietitian if you have questions about specific foods that are related to your condition, especially if those foods are not listed in the following section.  Increase your daily fiber consumption gradually. Increasing your intake of dietary fiber too quickly may cause bloating, cramping, or gas.  Drink plenty of water. Water helps you to digest fiber. What foods can I eat? Grains  Whole-grain breads. Multigrain cereal. Oats and oatmeal. Brown rice. Barley. Bulgur wheat. [place]. Bran muffins. Popcorn. Rye wafer crackers. Vegetables  Sweet potatoes. Spinach. Kale. Artichokes. Cabbage. Broccoli. Green peas. Carrots. Squash. Fruits  Berries. Pears. Apples. Oranges. Avocados. Prunes and raisins. Dried figs. Meats and Other Protein Sources  [place], kidney, pinto, and soy beans. Split peas. Lentils. Nuts and seeds. Dairy  Fiber-fortified yogurt. Beverages  Fiber-fortified soy milk. Fiber-fortified orange juice. Other  Fiber bars. The items listed above may not be a complete list of recommended foods or  beverages. Contact your dietitian for more options.  What foods are not recommended? Grains  White bread. Pasta made with refined flour. White rice. Vegetables  Fried potatoes. Canned vegetables. Well-cooked vegetables. Fruits  Fruit juice. Cooked, strained fruit. Meats and Other Protein Sources  Fatty cuts of meat. Fried Sales executive or fried fish. Dairy  Milk. Yogurt. Cream cheese. Sour cream. Beverages  Soft drinks. Other  Cakes and pastries. Butter and oils. The items listed above may not be a complete list of foods and beverages to avoid. Contact your dietitian for more information.  What are some tips for including high-fiber foods in my diet?  Eat a wide variety of high-fiber foods.  Make sure that half of all grains consumed each day are whole grains.  Replace breads and cereals made from refined flour or white flour with whole-grain breads and cereals.  Replace white rice with brown rice, bulgur wheat, or millet.  Start the day with a breakfast that is high in fiber, such as a cereal that contains at least 5 grams of fiber per serving.  Use beans in place of meat in soups, salads, or pasta.  Eat high-fiber snacks, such as berries, raw vegetables, nuts, or popcorn. This information is not intended to replace advice given to you by your health care provider. Make sure you discuss any questions you have with your health care provider. Document Released: 03/22/2005 Document Revised: 08/28/2015 Document Reviewed: 09/04/2013 Elsevier Interactive Patient Education  2017 [employer].  Call your primary care provider  or go to the ER if you experience fevers, abdominal pain, nausea, vomiting, unable to have a bowel movement or any other concerning symptoms.

## 2016-02-25 ENCOUNTER — Other Ambulatory Visit: Payer: Self-pay | Admitting: Specialist

## 2016-02-25 DIAGNOSIS — R5381 Other malaise: Secondary | ICD-10-CM

## 2016-04-29 ENCOUNTER — Other Ambulatory Visit: Payer: Self-pay | Admitting: Specialist

## 2016-04-29 DIAGNOSIS — M8580 Other specified disorders of bone density and structure, unspecified site: Secondary | ICD-10-CM

## 2016-04-29 DIAGNOSIS — M810 Age-related osteoporosis without current pathological fracture: Secondary | ICD-10-CM

## 2016-06-01 ENCOUNTER — Telehealth: Payer: Self-pay | Admitting: Oncology

## 2016-06-01 NOTE — Telephone Encounter (Signed)
Called and confirm appointment change with patient

## 2016-07-02 ENCOUNTER — Ambulatory Visit (HOSPITAL_BASED_OUTPATIENT_CLINIC_OR_DEPARTMENT_OTHER): Payer: Medicare Other | Admitting: Oncology

## 2016-07-02 ENCOUNTER — Telehealth: Payer: Self-pay | Admitting: Oncology

## 2016-07-02 ENCOUNTER — Ambulatory Visit: Payer: Medicare Other | Admitting: Oncology

## 2016-07-02 ENCOUNTER — Other Ambulatory Visit: Payer: Medicare Other

## 2016-07-02 ENCOUNTER — Other Ambulatory Visit (HOSPITAL_BASED_OUTPATIENT_CLINIC_OR_DEPARTMENT_OTHER): Payer: Medicare Other

## 2016-07-02 VITALS — BP 153/65 | HR 66 | Temp 98.5°F | Resp 18 | Ht 62.0 in | Wt 178.1 lb

## 2016-07-02 DIAGNOSIS — Z85038 Personal history of other malignant neoplasm of large intestine: Secondary | ICD-10-CM

## 2016-07-02 DIAGNOSIS — D72819 Decreased white blood cell count, unspecified: Secondary | ICD-10-CM | POA: Diagnosis not present

## 2016-07-02 DIAGNOSIS — C189 Malignant neoplasm of colon, unspecified: Secondary | ICD-10-CM

## 2016-07-02 LAB — CBC WITH DIFFERENTIAL/PLATELET
BASO%: 0.5 % (ref 0.0–2.0)
BASOS ABS: 0 10*3/uL (ref 0.0–0.1)
EOS%: 0.9 % (ref 0.0–7.0)
Eosinophils Absolute: 0 10*3/uL (ref 0.0–0.5)
HEMATOCRIT: 37.7 % (ref 34.8–46.6)
HEMOGLOBIN: 12.4 g/dL (ref 11.6–15.9)
LYMPH#: 0.5 10*3/uL — AB (ref 0.9–3.3)
LYMPH%: 23.6 % (ref 14.0–49.7)
MCH: 31.4 pg (ref 25.1–34.0)
MCHC: 32.9 g/dL (ref 31.5–36.0)
MCV: 95.4 fL (ref 79.5–101.0)
MONO#: 0.3 10*3/uL (ref 0.1–0.9)
MONO%: 11.8 % (ref 0.0–14.0)
NEUT#: 1.4 10*3/uL — ABNORMAL LOW (ref 1.5–6.5)
NEUT%: 63.2 % (ref 38.4–76.8)
Platelets: 100 10*3/uL — ABNORMAL LOW (ref 145–400)
RBC: 3.95 10*6/uL (ref 3.70–5.45)
RDW: 12.8 % (ref 11.2–14.5)
WBC: 2.2 10*3/uL — ABNORMAL LOW (ref 3.9–10.3)

## 2016-07-02 LAB — COMPREHENSIVE METABOLIC PANEL
ALBUMIN: 3.1 g/dL — AB (ref 3.5–5.0)
ALK PHOS: 59 U/L (ref 40–150)
ALT: 14 U/L (ref 0–55)
ANION GAP: 6 meq/L (ref 3–11)
AST: 18 U/L (ref 5–34)
BUN: 15.6 mg/dL (ref 7.0–26.0)
CALCIUM: 8.7 mg/dL (ref 8.4–10.4)
CO2: 26 mEq/L (ref 22–29)
Chloride: 113 mEq/L — ABNORMAL HIGH (ref 98–109)
Creatinine: 0.9 mg/dL (ref 0.6–1.1)
EGFR: 74 mL/min/{1.73_m2} — AB (ref >90)
Glucose: 114 mg/dl (ref 70–140)
POTASSIUM: 3.8 meq/L (ref 3.5–5.1)
Sodium: 145 mEq/L (ref 136–145)
Total Bilirubin: 0.34 mg/dL (ref 0.20–1.20)
Total Protein: 6.8 g/dL (ref 6.4–8.3)

## 2016-07-02 LAB — CEA (IN HOUSE-CHCC)

## 2016-07-02 NOTE — Progress Notes (Signed)
Hematology and Oncology Follow Up Visit  [name] [id number] 1945-06-15 71 y.o. 07/02/2016 9:32 AM   Principle Diagnosis: 71 year old woman with stage IV colon cancer with a history of an isolated metastasis to the liver diagnosed in 04/2009 She is currently NED.   Prior Therapy:  She is status post colectomy as well as radiofrequency ablation to an isolated liver met on 05/05/2009 at [facility].  She is status post chemotherapy with FOLFOX 6 and Avastin from 06/08/2009 through 12/19/2009 requiring dose reduction for thrombocytopenia and leukopenia. CT scan findings were retroperitoneal and anterior adenopathy status post CT guided core biopsy on 03/15/2011 showed no evidence  of malignancy except for fibrosis. 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   Current therapy: Observation and follow up.   Interim History: [name] presents today for a follow up visit. Since her last visit, she reports no recent complaints. She continues to have issues with post herpetic neuralgia. She does take gabapentin which helps her symptoms at the time. She was hospitalized back in November for abdominal and back pain and her workup was unrevealing. She had a CT scan of the abdomen and pelvis as well as a CT scan of the chest. No evidence of malignancy detected at that time.  She report no GI symptoms, no bleeding or weight loss. Her appetite remain excellent and have gained weight. She continues to attend to her activities of daily living.  She has not reported any bleeding problems nor any other constitutional symptoms. She does not report any headaches or blurry vision or syncope. She has not reported any chest pain or palpitations. She does not report any cough, wheezing or hemoptysis. She does not report any skeletal complaints without any arthralgias or myalgias. She is not on any lymphadenopathy or petechiae. She does not report any frequency  urgency or hesitancy. Rest of her review of systems unremarkable.   Medications: I have reviewed the patient's current medications. Current Outpatient Prescriptions  Medication Sig Dispense Refill  . albuterol (PROVENTIL HFA;VENTOLIN HFA) 108 (90 BASE) MCG/ACT inhaler Inhale 2 puffs into the lungs every 6 (six) hours as needed for wheezing or shortness of breath.     Marland Kitchen aspirin EC 81 MG tablet Take 81 mg by mouth daily.    . calcium-vitamin D (OSCAL WITH D) 500-200 MG-UNIT tablet Take 1 tablet by mouth.    . cetirizine (ZYRTEC) 10 MG tablet Take 10 mg by mouth at bedtime.     . cloNIDine (CATAPRES) 0.1 MG tablet Take 0.1 mg by mouth 2 (two) times daily.     Marland Kitchen diltiazem (CARDIZEM CD) 180 MG 24 hr capsule Take 180 mg by mouth daily.    . ferrous sulfate 325 (65 FE) MG tablet Take 325 mg by mouth 3 (three) times daily with meals.    . Fluticasone-Salmeterol (ADVAIR) 100-50 MCG/DOSE AEPB Inhale 2 puffs into the lungs 2 (two) times daily as needed (wheezing).     . furosemide (LASIX) 20 MG tablet Take 20 mg by mouth daily.    Marland Kitchen gabapentin (NEURONTIN) 600 MG tablet Take 600 mg by mouth 3 (three) times daily.  3  . HYDROcodone-acetaminophen (NORCO/VICODIN) 5-325 MG tablet Take 1 tablet by mouth every 4 (four) hours as needed for moderate pain. 30 tablet 0  . levETIRAcetam (KEPPRA) 500 MG tablet Take 1 tablet (500 mg total) by mouth 2 (two) times daily. 180 tablet 3  . losartan-hydrochlorothiazide (HYZAAR) 100-12.5 MG tablet Take 1  tablet by mouth daily.    . meloxicam (MOBIC) 15 MG tablet Take 15 mg by mouth daily.  1  . Multiple Vitamins-Minerals (CENTRUM SILVER ADULT 50+ PO) Take 1 tablet by mouth daily.    Marland Kitchen PARoxetine (PAXIL) 20 MG tablet Take 20 mg by mouth daily.    . polyethylene glycol (MIRALAX / GLYCOLAX) packet Take 17 g by mouth daily as needed for mild constipation.    . potassium chloride SA (K-DUR,KLOR-CON) 20 MEQ tablet Take 20 mEq by mouth daily.     . traZODone (DESYREL) 50 MG tablet  Take 50 mg by mouth at bedtime.  1   No current facility-administered medications for this visit.     Allergies: No Known Allergies  Past Medical History, Surgical history, Social history, and Family History were reviewed and updated.   Physical Exam: Blood pressure (!) 153/65, pulse 66, temperature 98.5 F (36.9 C), temperature source Oral, resp. rate 18, height _0  (1.575 m), weight 178 lb 1.6 oz (80.8 kg), SpO2 98 %. ECOG: 1 General appearance: Well-appearing woman without distress. Head: Normocephalic, without obvious abnormality Neck: no adenopathy Lymph nodes: Cervical, supraclavicular, and axillary nodes normal. Heart:regular rate and rhythm, S1, S2 normal, no murmur, click, rub or gallop Lung:chest clear, no wheezing, rales, normal symmetric air entry Abdomin: soft, non-tender, without masses or organomegaly no rebound or guarding. EXT:no erythema, induration, or nodules Skin: No skin rashes or lesions.  Lab Results: Lab Results  Component Value Date   WBC 2.2 (L) 07/02/2016   HGB 12.4 07/02/2016   HCT 37.7 07/02/2016   MCV 95.4 07/02/2016   PLT 100 (L) 07/02/2016     Chemistry      Component Value Date/Time   NA 138 02/19/2016 0437   NA 142 06/26/2015 0849   K 3.9 02/19/2016 0437   K 3.7 06/26/2015 0849   CL 109 02/19/2016 0437   CL 106 06/19/2012 0936   CO2 23 02/19/2016 0437   CO2 29 06/26/2015 0849   BUN 7 02/19/2016 0437   BUN 16.7 06/26/2015 0849   CREATININE 0.74 02/20/2016 0516   CREATININE 0.9 06/26/2015 0849      Component Value Date/Time   CALCIUM 8.3 (L) 02/19/2016 0437   CALCIUM 8.8 06/26/2015 0849   ALKPHOS 42 02/15/2016 0902   ALKPHOS 56 06/26/2015 0849   AST 22 02/15/2016 0902   AST 23 06/26/2015 0849   ALT 14 02/15/2016 0902   ALT 16 06/26/2015 0849   BILITOT 0.8 02/15/2016 0902   BILITOT 0.43 06/26/2015 0849      Current Outpatient Prescriptions  Medication Sig Dispense Refill  . albuterol (PROVENTIL HFA;VENTOLIN HFA) 108  (90 BASE) MCG/ACT inhaler Inhale 2 puffs into the lungs every 6 (six) hours as needed for wheezing or shortness of breath.     Marland Kitchen aspirin EC 81 MG tablet Take 81 mg by mouth daily.    . calcium-vitamin D (OSCAL WITH D) 500-200 MG-UNIT tablet Take 1 tablet by mouth.    . cetirizine (ZYRTEC) 10 MG tablet Take 10 mg by mouth at bedtime.     . cloNIDine (CATAPRES) 0.1 MG tablet Take 0.1 mg by mouth 2 (two) times daily.     Marland Kitchen diltiazem (CARDIZEM CD) 180 MG 24 hr capsule Take 180 mg by mouth daily.    . ferrous sulfate 325 (65 FE) MG tablet Take 325 mg by mouth 3 (three) times daily with meals.    . Fluticasone-Salmeterol (ADVAIR) 100-50 MCG/DOSE AEPB Inhale 2 puffs into  the lungs 2 (two) times daily as needed (wheezing).     . furosemide (LASIX) 20 MG tablet Take 20 mg by mouth daily.    Marland Kitchen gabapentin (NEURONTIN) 600 MG tablet Take 600 mg by mouth 3 (three) times daily.  3  . HYDROcodone-acetaminophen (NORCO/VICODIN) 5-325 MG tablet Take 1 tablet by mouth every 4 (four) hours as needed for moderate pain. 30 tablet 0  . levETIRAcetam (KEPPRA) 500 MG tablet Take 1 tablet (500 mg total) by mouth 2 (two) times daily. 180 tablet 3  . losartan-hydrochlorothiazide (HYZAAR) 100-12.5 MG tablet Take 1 tablet by mouth daily.    . meloxicam (MOBIC) 15 MG tablet Take 15 mg by mouth daily.  1  . Multiple Vitamins-Minerals (CENTRUM SILVER ADULT 50+ PO) Take 1 tablet by mouth daily.    Marland Kitchen PARoxetine (PAXIL) 20 MG tablet Take 20 mg by mouth daily.    . polyethylene glycol (MIRALAX / GLYCOLAX) packet Take 17 g by mouth daily as needed for mild constipation.    . potassium chloride SA (K-DUR,KLOR-CON) 20 MEQ tablet Take 20 mEq by mouth daily.     . traZODone (DESYREL) 50 MG tablet Take 50 mg by mouth at bedtime.  1   No current facility-administered medications for this visit.     IMPRESSION: 1. No pulmonary emboli or acute abnormality. 2. Mild increase in size of a possible metastatic right hilar lymph node without  significant change and a mildly enlarged subcarinal lymph node. 3. Mild changes of COPD. 4. Further decrease in size of the treated metastasis in the left lobe of the liver. 5. Stable large right lobe liver cavernous hemangioma. 6. Mild coronary artery atherosclerotic calcifications. 7. Minimal aortic atherosclerosis.     Impression and Plan:  71 year old woman with:  1. Stage IV colon cancer with a history of an isolated metastasis to the liver. Status post colectomy as well as radiofrequency ablation to an isolated liver met on 05/05/2009 at [facility]. Status post chemotherapy with FOLFOX 6 and Avastin from 06/08/2009 through 12/19/2009.  Her CT scan from 02/19/2017 was reviewed and showed no evidence to suggest recurrent disease. She does have enlarged thoracic lymph nodes that appear to be stable over a period of time. Appears to be more reactive than malignant in nature.  She will continue an active surveillance at this time and repeat CT scan as needed.  2. 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 Her port has been removed on 06/2011.   3. Leukocytopenia: This has been chronic in nature and continues to be fluctuating.  4. Herpes zoster: Her post herpetic neuralgia manageable with current pain medication including Neurontin and fentanyl patch.  5. Followup: Will be in 12 months.       Zola Button, MD 3/30/20189:32 AM

## 2016-07-02 NOTE — Telephone Encounter (Signed)
Gave patient AVS and calender per 07/02/2016 los.  

## 2016-07-03 LAB — CEA: CEA: 1.1 ng/mL (ref 0.0–4.7)

## 2016-08-09 ENCOUNTER — Ambulatory Visit: Payer: Medicare Other | Admitting: Nurse Practitioner

## 2016-08-10 ENCOUNTER — Encounter: Payer: Self-pay | Admitting: Nurse Practitioner

## 2016-09-23 ENCOUNTER — Ambulatory Visit (INDEPENDENT_AMBULATORY_CARE_PROVIDER_SITE_OTHER): Payer: Medicare Other | Admitting: Nurse Practitioner

## 2016-09-23 ENCOUNTER — Encounter: Payer: Self-pay | Admitting: Nurse Practitioner

## 2016-09-23 VITALS — BP 122/75 | HR 70 | Ht 62.0 in | Wt 175.6 lb

## 2016-09-23 DIAGNOSIS — G40309 Generalized idiopathic epilepsy and epileptic syndromes, not intractable, without status epilepticus: Secondary | ICD-10-CM | POA: Diagnosis not present

## 2016-09-23 MED ORDER — LEVETIRACETAM 500 MG PO TABS
500.0000 mg | ORAL_TABLET | Freq: Two times a day (BID) | ORAL | 3 refills | Status: DC
Start: 2016-09-23 — End: 2017-01-22

## 2016-09-23 NOTE — Progress Notes (Signed)
[facility]  PATIENT: [name] DOB: [birth year]   REASON FOR VISIT: Follow-up for seizure disorder HISTORY FROM: Patient    HISTORY OF PRESENT ILLNESSMs.Lagunes, is a 71 years old right-handed female, follow-up for seizure, she only has one seizure in February second 2012   She has past medical history of hypertension, colon cancer in 2011, status post surgery, chemotherapy, she also underwent hysterectomy in February 2012 for uterus prolapse, she presented with seizure in May 27, 2010.  She was discharged from nursing home after her hysterectomy due to the needs for prolonged antibiotic infusion,she is not sure the name of the medications.  in May 27, 2010 while at nursing home, walking in her room in the early afternoon, without warning signs, she had one seizure, was taken to the emergency room. She had no recollection of what has happened, wake up in the emergency room confused. CAT scan without contrast of brain was normal. Laboratory evaluation has demonstrate decreased white count 2.6, anemia hemoglobin 9.3, hematocrit 29.7, UA showed UTI, moderate esterase, she apparently was started on Keppra 250 mg b.i.d. 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 EEG normal.   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, no gait difficulty, lives alone, still driving,  UPDATE Feb 2nd 2017:  She had one nocturnal seizure in May 04 2015 during sleep, witnessed by her daughter, tonic-clonic seizure for a few minutes followed by post event  confusion, with tongue biting, was taken to the emergency room, I have reviewed laboratory evaluation, evidence of UTI, low albumin of 2.7, mild anemia 11, she is now taking Keppra 500 mg twice a day, tolerating it well  UPDATE June 05 2015:YY Since last visit in Feb 2nd 2017, she fell twice, her left leg gave out underneath her, she had no confusion, no seizure-like activities,  She has not had MRI and EEG, she is able to tolerating Keppra 500 mg twice a day well   UPDATE Aug 05 2015: YY She has no recurrent seizure since last visit, tolerating Keppra 500 mg twice a day We have personally reviewed MRI of the brain with without contrast in March 2017: moderate changes of chronic microvascular ischemia and mild degree of generalized cerebral atrophy. No enhancing lesions are noted. Compared with previous MRI scan dated 09/21/2013 the chronic microvascular ischemic changes appear to be more advanced  CT abdomen chest, pelvic with contrast in March 2017:1. Stable examination. No findings to suggest recurrent tumor or metastatic disease. Decrease in size of treated lesion within left lobe of liver. No new metastatic lesions identified. EEG was normal in March 2017  Today she reported a history of childhood generalized seizure from age 73 to age 19, but she was never treated with any antiepileptic medications.: UPDATE 11/07/2017[name], 71 year old female returns for follow-up. She has a history of generalized seizure disorder with last seizure occurring in January of this year. She is currently on Keppra 500 mg twice daily without further seizure events and side effects. EEG was normal in March 2017. MRI of the brain with and without contrast in March 2017 with moderate changes of chronic  microvascular ischemia and mild degree of generalized cerebral atrophy no enhancing lesions. The patient is wishing to return to drive. She remains independent in all activities of daily living. She returns for  reevaluation UPDATE 06/21/2018[name], 71 year old female returns for follow-up with history of generalized seizure disorder. Her last seizure was January 2017. She remains on Keppra 500 twice daily without side effects or further seizure events. MRI of the brain with and without contrast March 2017with moderate changes of chronic microvascular ischemia and mild degree of generalized cerebral atrophy no enhancing lesions. EEG was normal she continues to live alone, independent in all activities of daily living. She no longer drives. She returns for reevaluation REVIEW OF SYSTEMS: Full 14 system review of systems performed and notable only for those listed, all others are neg:  Constitutional: neg  Cardiovascular: neg Ear/Nose/Throat: neg  Skin: neg Eyes: Blurred vision Respiratory: neg Gastroitestinal: neg  Hematology/Lymphatic: neg  Endocrine: neg Musculoskeletal:neg Allergy/Immunology: neg Neurological: Dizziness Psychiatric: neg Sleep : neg   ALLERGIES: No Known Allergies  HOME MEDICATIONS: Outpatient Medications Prior to Visit  Medication Sig Dispense Refill  . albuterol (PROVENTIL HFA;VENTOLIN HFA) 108 (90 BASE) MCG/ACT inhaler Inhale 2 puffs into the lungs every 6 (six) hours as needed for wheezing or shortness of breath.     Marland Kitchen aspirin EC 81 MG tablet Take 81 mg by mouth daily.    . calcium-vitamin D (OSCAL WITH D) 500-200 MG-UNIT tablet Take 1 tablet by mouth.    . cetirizine (ZYRTEC) 10 MG tablet Take 10 mg by mouth at bedtime.     . cloNIDine (CATAPRES) 0.1 MG tablet Take 0.1 mg by mouth 2 (two) times daily.     Marland Kitchen diltiazem (CARDIZEM CD) 180 MG 24 hr capsule Take 180 mg by mouth daily.    . Fluticasone-Salmeterol (ADVAIR) 100-50 MCG/DOSE AEPB Inhale 2 puffs into the lungs 2 (two) times daily as needed (wheezing).     . furosemide (LASIX) 20 MG tablet Take 20 mg by mouth daily.    Marland Kitchen gabapentin (NEURONTIN) 600 MG tablet Take 600 mg by mouth 3 (three) times daily.  3  .  levETIRAcetam (KEPPRA) 500 MG tablet Take 1 tablet (500 mg total) by mouth 2 (two) times daily. 180 tablet 3  . losartan-hydrochlorothiazide (HYZAAR) 100-12.5 MG tablet Take 1 tablet by mouth daily.    . meloxicam (MOBIC) 15 MG tablet Take 15 mg by mouth daily.  1  . Multiple Vitamins-Minerals (CENTRUM SILVER ADULT 50+ PO) Take 1 tablet by mouth daily.    . polyethylene glycol (MIRALAX / GLYCOLAX) packet Take 17 g by mouth daily as needed for mild constipation.    . potassium chloride SA (K-DUR,KLOR-CON) 20 MEQ tablet Take 20 mEq by mouth daily.     . traZODone (DESYREL) 50 MG tablet Take 50 mg by mouth at bedtime.  1  . ferrous sulfate 325 (65 FE) MG tablet Take 325 mg by mouth 3 (three) times daily with meals.    Marland Kitchen HYDROcodone-acetaminophen (NORCO/VICODIN) 5-325 MG tablet Take 1 tablet by mouth every 4 (four) hours as needed for moderate pain. 30 tablet 0  . PARoxetine (PAXIL) 20 MG tablet Take 20 mg by mouth daily.     No facility-administered medications prior to visit.     PAST MEDICAL HISTORY: Past Medical History:  Diagnosis Date  . Anemia   . Arthritis   . Asthma   . Clotting disorder ([place])   . Colon cancer ([place])   . DVT (deep  venous thrombosis) ([place])    right jugular  . Essential hypertension 02/20/2016  . Hypertension   . 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 PONV (postoperative nausea and vomiting)   . Seizures ([place])   . Shingles rash   . Spinal headache     PAST SURGICAL HISTORY: Past Surgical History:  Procedure Laterality Date  . ABDOMINAL HYSTERECTOMY  2012  . COLON SURGERY    . COLONOSCOPY    . PORTACATH PLACEMENT    . RADIOFREQUENCY ABLATION LIVER TUMOR    . SMALL INTESTINE SURGERY      FAMILY HISTORY: Family History  Problem Relation Age of Onset  . Hypertension Father   . Cancer Brother        colon  . Cancer Daughter        colon  . Breast cancer Sister     SOCIAL HISTORY: Social History   Social History  . Marital  status: Divorced    Spouse name: N/A  . Number of children: 3  . Years of education: 12+   Occupational History  . Retired    Social History Main Topics  . Smoking status: Never Smoker  . Smokeless tobacco: Never Used  . Alcohol use No  . Drug use: No  . Sexual activity: No   Other Topics Concern  . Not on file   Social History Narrative   Patient lives at home alone.    Patient is divorced.    Patient has 3 children.    Patient has some college.    Patient is retired.      PHYSICAL EXAM  Vitals:   09/23/16 1318  BP: 122/75  Pulse: 70  Weight: 175 lb 9.6 oz (79.7 kg)  Height: _0  (1.575 m)   Body mass index is 32.12 kg/m.  Generalized: Well developed, in no acute distress  Head: normocephalic and atraumatic,. Oropharynx benign  Neck: Supple, no carotid bruits  Cardiac: Regular rate rhythm, no murmur  Musculoskeletal: No deformity   Neurological examination   Mentation: Alert oriented to time, place, history taking. Attention span and concentration appropriate. Recent and remote memory intact.  Follows all commands speech and language fluent.   Cranial nerve II-XII: Pupils were equal round reactive to light extraocular movements were full, visual field were full on confrontational test. Facial sensation and strength were normal. hearing was intact to finger rubbing bilaterally. Uvula tongue midline. head turning and shoulder shrug were normal and symmetric.Tongue protrusion into cheek strength was normal. Motor: normal bulk and tone, full strength in the BUE, BLE, fine finger movements normal, no pronator drift. No focal weakness Sensory: normal and symmetric to light touch, in the upper and lower extremities Coordination: finger-nose-finger, heel-to-shin bilaterally, no dysmetria Reflexes: Symmetric upper and lower, plantar responses were flexor bilaterally. Gait and Station: Rising up from seated position without assistance, normal stance,  moderate stride,  good arm swing, smooth turning, able to perform tiptoe, and heel walking without difficulty. Tandem gait is steady. No assistive device  DIAGNOSTIC DATA (LABS, IMAGING, TESTING) - I reviewed patient records, labs, notes, testing and imaging myself where available.  Lab Results  Component Value Date   WBC 2.2 (L) 07/02/2016   HGB 12.4 07/02/2016   HCT 37.7 07/02/2016   MCV 95.4 07/02/2016   PLT 100 (L) 07/02/2016      Component Value Date/Time   NA 145 07/02/2016 0858   K 3.8 07/02/2016 0858   CL 109  02/19/2016 0437   CL 106 06/19/2012 0936   CO2 26 07/02/2016 0858   GLUCOSE 114 07/02/2016 0858   GLUCOSE 93 06/19/2012 0936   BUN 15.6 07/02/2016 0858   CREATININE 0.9 07/02/2016 0858   CALCIUM 8.7 07/02/2016 0858   PROT 6.8 07/02/2016 0858   ALBUMIN 3.1 (L) 07/02/2016 0858   AST 18 07/02/2016 0858   ALT 14 07/02/2016 0858   ALKPHOS 59 07/02/2016 0858   BILITOT 0.34 07/02/2016 0858   GFRNONAA >60 02/20/2016 0516   GFRAA >60 02/20/2016 0516    ASSESSMENT AND PLAN  71 y.o. year old female  has a past medical history of  Colon cancer ([place]);  met colon ca to liver (dx'd 02/25/09);  Seizures ([place]);  here To follow-up for her seizure disorder. Last seizure January 2017. The patient is a current patient of Dr. Krista Blue  who is out of the office today . This note is sent to the work in doctor.      PLAN: Continue Keppra 500 twice a day for seizure disorder will refill Continue calcium and vitamin D supplements Call for any seizure activity Follow-up yearly  Next with Dr. Krista Blue I spent 59mn  in total face to face time with the patient more than 50% of which was spent counseling and coordination of care, reviewing test results reviewing medications and discussing and reviewing the diagnosis of seizure disorder and further treatment options. If necessary. Important to call u[place]for any seizure activity so that  medications can be adjusted NDennie Bible [facility] APRN  [facility] [address] [place]af[place] [phone]

## 2016-09-23 NOTE — Patient Instructions (Signed)
Continue Keppra 500 twice a day for seizure disorder Continue calcium and vitamin D supplements Call for any seizure activity Follow-up yearly  Next with Dr. Krista Blue

## 2016-10-16 NOTE — Progress Notes (Signed)
Personally  participated in, made any corrections needed, and agree with history, physical, neuro exam,assessment and plan as stated.     Antonia Ahern, MD [facility]     

## 2016-10-25 ENCOUNTER — Telehealth: Payer: Self-pay | Admitting: Oncology

## 2016-10-25 NOTE — Telephone Encounter (Signed)
F[facility] [phone]

## 2017-01-22 ENCOUNTER — Emergency Department (HOSPITAL_COMMUNITY): Payer: Medicare Other

## 2017-01-22 ENCOUNTER — Emergency Department (HOSPITAL_COMMUNITY)
Admission: EM | Admit: 2017-01-22 | Discharge: 2017-01-22 | Disposition: A | Discharge status: Home / Self Care | Payer: Medicare Other | Attending: Emergency Medicine | Admitting: Emergency Medicine

## 2017-01-22 ENCOUNTER — Encounter (HOSPITAL_COMMUNITY): Payer: Self-pay | Admitting: Emergency Medicine

## 2017-01-22 DIAGNOSIS — M792 Neuralgia and neuritis, unspecified: Secondary | ICD-10-CM | POA: Diagnosis not present

## 2017-01-22 DIAGNOSIS — J45909 Unspecified asthma, uncomplicated: Secondary | ICD-10-CM | POA: Insufficient documentation

## 2017-01-22 DIAGNOSIS — R1032 Left lower quadrant pain: Secondary | ICD-10-CM | POA: Diagnosis not present

## 2017-01-22 DIAGNOSIS — M549 Dorsalgia, unspecified: Secondary | ICD-10-CM | POA: Diagnosis present

## 2017-01-22 DIAGNOSIS — I10 Essential (primary) hypertension: Secondary | ICD-10-CM | POA: Diagnosis not present

## 2017-01-22 DIAGNOSIS — Z79899 Other long term (current) drug therapy: Secondary | ICD-10-CM | POA: Insufficient documentation

## 2017-01-22 DIAGNOSIS — Z7982 Long term (current) use of aspirin: Secondary | ICD-10-CM | POA: Diagnosis not present

## 2017-01-22 LAB — CBC WITH DIFFERENTIAL/PLATELET
BASOS ABS: 0 10*3/uL (ref 0.0–0.1)
Basophils Relative: 0 %
EOS ABS: 0 10*3/uL (ref 0.0–0.7)
EOS PCT: 0 %
HCT: 38.7 % (ref 36.0–46.0)
Hemoglobin: 13.1 g/dL (ref 12.0–15.0)
Lymphocytes Relative: 32 %
Lymphs Abs: 0.8 10*3/uL (ref 0.7–4.0)
MCH: 31.5 pg (ref 26.0–34.0)
MCHC: 33.9 g/dL (ref 30.0–36.0)
MCV: 93 fL (ref 78.0–100.0)
MONO ABS: 0.3 10*3/uL (ref 0.1–1.0)
Monocytes Relative: 12 %
Neutro Abs: 1.5 10*3/uL — ABNORMAL LOW (ref 1.7–7.7)
Neutrophils Relative %: 56 %
Platelets: 140 10*3/uL — ABNORMAL LOW (ref 150–400)
RBC: 4.16 MIL/uL (ref 3.87–5.11)
RDW: 12.2 % (ref 11.5–15.5)
WBC: 2.6 10*3/uL — AB (ref 4.0–10.5)

## 2017-01-22 LAB — I-STAT CHEM 8, ED
BUN: 13 mg/dL (ref 6–20)
CALCIUM ION: 1.03 mmol/L — AB (ref 1.15–1.40)
CHLORIDE: 100 mmol/L — AB (ref 101–111)
CREATININE: 1 mg/dL (ref 0.44–1.00)
GLUCOSE: 95 mg/dL (ref 65–99)
HCT: 38 % (ref 36.0–46.0)
HEMOGLOBIN: 12.9 g/dL (ref 12.0–15.0)
POTASSIUM: 3.8 mmol/L (ref 3.5–5.1)
Sodium: 136 mmol/L (ref 135–145)
TCO2: 29 mmol/L (ref 22–32)

## 2017-01-22 LAB — URINALYSIS, ROUTINE W REFLEX MICROSCOPIC
Bilirubin Urine: NEGATIVE
Glucose, UA: NEGATIVE mg/dL
Hgb urine dipstick: NEGATIVE
KETONES UR: NEGATIVE mg/dL
Nitrite: NEGATIVE
Protein, ur: NEGATIVE mg/dL
Specific Gravity, Urine: 1.012 (ref 1.005–1.030)
pH: 6 (ref 5.0–8.0)

## 2017-01-22 MED ORDER — OXYCODONE-ACETAMINOPHEN 5-325 MG PO TABS
1.0000 | ORAL_TABLET | Freq: Once | ORAL | Status: AC
  Administered 2017-01-22: 1 via ORAL
  Filled 2017-01-22: qty 1

## 2017-01-22 MED ORDER — MECLIZINE HCL 25 MG PO TABS
12.5000 mg | ORAL_TABLET | Freq: Once | ORAL | Status: AC
  Administered 2017-01-22: 12.5 mg via ORAL
  Filled 2017-01-22: qty 1

## 2017-01-22 MED ORDER — OXYCODONE-ACETAMINOPHEN 5-325 MG PO TABS: 1.0000 | ORAL_TABLET | Freq: Four times a day (QID) | ORAL | 0 refills | Status: DC | PRN

## 2017-01-22 MED ORDER — MECLIZINE HCL 12.5 MG PO TABS: 12.5000 mg | ORAL_TABLET | Freq: Three times a day (TID) | ORAL | 0 refills | Status: DC | PRN

## 2017-01-22 NOTE — ED Triage Notes (Signed)
Patient coming from home c/o lower left sided back pain for past month. States she saw her PCP and was given gabapentin TID and over the past few days has become less effective. Pt ambulatory.

## 2017-01-22 NOTE — Discharge Instructions (Signed)
Your pain is likely due to postherpetic neuralgia.  Please continue with lidocain patch and gabapentin. You make take percocet as needed when the pain is severe.  Your CT scan shows some changes to your lower back, discussed this with your primary care provider.  You may benefit from an lumbar MRI with and without contrast for further evaluation.  Take meclizine as needed for dizziness. Return if you have any concerns.

## 2017-01-22 NOTE — ED Provider Notes (Signed)
[facility] DEPT Provider Note   CSN: [id number] Arrival date & time: 01/22/17  1017     History   Chief Complaint Chief Complaint  Patient presents with  . Back Pain    HPI [name] is a 71 y.o. female.  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 [place] 5 years ago with post herpetic neuralgia.  Pt experienced increase L back pain x 1 month, worse last night. Described as sharp achy pain, 8/10 to L mid back radiates to L chest/abdomen.  Pain is persistent.  Pain worse in the morning.  Has tried aspercreme, lidocaine patches and gabapentin without improvement.  Was seen by PCP 5 days ago for the same problem and was prescribed lidocaine patch.  Pt denies loss of range of motion but states movement hurts, including walking.  Denies numbness, burning, or itching but report tingling sensation to the affected area.  Also take ASA and meloxicam.  Pt lives alone, doesn't have a lot of family support.  Denies fever, chills, weight changes, or appetite changes.  No cp, sob, abdominal discomfort, incontinence or dysuria.        Past Medical History:  Diagnosis Date  . Anemia   . Arthritis   . Asthma   . Clotting disorder ([place])   . Colon cancer ([place])   . DVT (deep venous thrombosis) ([facility])    right jugular  . Essential hypertension 02/20/2016  . Hypertension   . 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 PONV (postoperative nausea and vomiting)   . Seizures ([place])   . Shingles rash   . Spinal headache     Patient Active Problem List   Diagnosis Date Noted  . Essential hypertension 02/20/2016  . Small bowel obstruction ([place]) 02/13/2016  . Seizures ([place]) 05/08/2015  . 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 Malignant neoplasm of colon ([place]) 02/07/2011  . Breast mass seen on  mammogram 02/02/2011  . History of colon cancer, stage IV 02/02/2011    Past Surgical History:  Procedure Laterality Date  . ABDOMINAL HYSTERECTOMY  2012  . COLON SURGERY    . COLONOSCOPY    . PORTACATH PLACEMENT    . RADIOFREQUENCY ABLATION LIVER TUMOR    . SMALL INTESTINE SURGERY      OB History    No data available       Home Medications    Prior to Admission medications   Medication Sig Start Date End Date Taking? Authorizing Provider  albuterol (PROVENTIL HFA;VENTOLIN HFA) 108 (90 BASE) MCG/ACT inhaler Inhale 2 puffs into the lungs every 6 (six) hours as needed for wheezing or shortness of breath.     [provider]  aspirin EC 81 MG tablet Take 81 mg by mouth daily.    [provider]  calcium-vitamin D (OSCAL WITH D) 500-200 MG-UNIT tablet Take 1 tablet by mouth.    [provider]  cetirizine (ZYRTEC) 10 MG tablet Take 10 mg by mouth at bedtime.     [provider]  cloNIDine (CATAPRES) 0.1 MG tablet Take 0.1 mg by mouth 2 (two) times daily.     [provider]  diltiazem (CARDIZEM CD) 180 MG 24 hr capsule Take 180 mg by mouth daily. 12/06/13   [provider]  Fluticasone-Salmeterol (ADVAIR) 100-50 MCG/DOSE  AEPB Inhale 2 puffs into the lungs 2 (two) times daily as needed (wheezing).     [provider]  furosemide (LASIX) 20 MG tablet Take 20 mg by mouth daily.    [provider]  gabapentin (NEURONTIN) 600 MG tablet Take 600 mg by mouth 3 (three) times daily. 05/14/15   [provider]  HYDROcodone-acetaminophen (NORCO/VICODIN) 5-325 MG tablet Take 1 tablet by mouth every 4 (four) hours as needed for moderate pain. 02/20/16   Earnstine Regal, PA-C  levETIRAcetam (KEPPRA) 500 MG tablet Take 1 tablet (500 mg total) by mouth 2 (two) times daily. 09/23/16   Dennie Bible, NP  losartan-hydrochlorothiazide (HYZAAR) 100-12.5 MG tablet Take 1 tablet by mouth daily.    [provider]    meloxicam (MOBIC) 15 MG tablet Take 15 mg by mouth daily. 05/14/15   [provider]  Multiple Vitamins-Minerals (CENTRUM SILVER ADULT 50+ PO) Take 1 tablet by mouth daily.    [provider]  PARoxetine (PAXIL) 20 MG tablet Take 20 mg by mouth daily.    [provider]  polyethylene glycol (MIRALAX / GLYCOLAX) packet Take 17 g by mouth daily as needed for mild constipation.    [provider]  potassium chloride SA (K-DUR,KLOR-CON) 20 MEQ tablet Take 20 mEq by mouth daily.     [provider]  traZODone (DESYREL) 50 MG tablet Take 50 mg by mouth at bedtime. 06/24/15   [provider]    Family History Family History  Problem Relation Age of Onset  . Hypertension Father   . Cancer Brother        colon  . Cancer Daughter        colon  . Breast cancer Sister     Social History Social History  Substance Use Topics  . Smoking status: Never Smoker  . Smokeless tobacco: Never Used  . Alcohol use No     Allergies   Patient has no known allergies.   Review of Systems Review of Systems  All other systems reviewed and are negative.    Physical Exam Updated Vital Signs BP (!) 157/94 (BP Location: Left Arm)   Pulse 77   Temp 98.6 F (37 C) (Oral)   Resp 18   Ht 5' 2" (1.575 m)   Wt 80.7 kg (178 lb)   SpO2 96%   BMI 32.56 kg/m   Physical Exam  Constitutional: She is oriented to person, place, and time. She appears well-developed and well-nourished. No distress.  HENT:  Head: Atraumatic.  Eyes: Conjunctivae are normal.  Neck: Neck supple.  Cardiovascular: Normal rate and regular rhythm.   Pulmonary/Chest: Effort normal. She has rales (faint crackles at lower lung base).  Abdominal: Soft. Bowel sounds are normal. She exhibits no distension. There is tenderness (mild generalized tenderness without guarding or rebound tenderness).  Musculoskeletal: She exhibits tenderness (tenderness along left side of back at the level  of T12 L2 follows to her left inframammary region without any rash or overlying skin changes.).  Neurological: She is alert and oriented to person, place, and time.  Skin: No rash noted.  Psychiatric: She has a normal mood and affect.  Nursing note and vitals reviewed.    ED Treatments / Results  Labs (all labs ordered are listed, but only abnormal results are displayed) Labs Reviewed  CBC WITH DIFFERENTIAL/PLATELET - Abnormal; Notable for the following:       Result Value   WBC 2.6 (*)    Platelets 140 (*)  Neutro Abs 1.5 (*)    All other components within normal limits  URINALYSIS, ROUTINE W REFLEX MICROSCOPIC - Abnormal; Notable for the following:    Leukocytes, UA MODERATE (*)    Bacteria, UA RARE (*)    Squamous Epithelial / LPF 0-5 (*)    All other components within normal limits  I-STAT CHEM 8, ED - Abnormal; Notable for the following:    Chloride 100 (*)    Calcium, Ion 1.03 (*)    All other components within normal limits    EKG  EKG Interpretation  Date/Time:  Saturday January 22 2017 13:58:35 EDT Ventricular Rate:  59 PR Interval:    QRS Duration: 78 QT Interval:  426 QTC Calculation: 422 R Axis:   40 Text Interpretation:  Sinus rhythm Borderline T wave abnormalities Confirmed by Dorie Rank [phone]) on 01/22/2017 2:13:14 PM Also confirmed by Dorie Rank [phone]), editor Drema Pry [phone])  on 01/22/2017 2:25:37 PM       Radiology Ct Abdomen Pelvis Wo Contrast  Result Date: 01/22/2017 CLINICAL DATA:  Left lower quadrant abdominal pain EXAM: CT ABDOMEN AND PELVIS WITHOUT CONTRAST TECHNIQUE: Multidetector CT imaging of the abdomen and pelvis was performed following the standard protocol without IV contrast. COMPARISON:  02/13/2016 FINDINGS: Lower chest: Minor bibasilar diffuse cylindrical bronchiectasis and peripheral atelectasis/scarring. No lower lobe collapse or consolidation. Negative for edema. No pleural abnormality or effusion. Normal heart  size. No pericardial effusion. 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 Stable additional hepatic cysts. No biliary obstruction. Gallbladder and biliary system are unremarkable. Pancreas: Unremarkable. No pancreatic ductal dilatation or surrounding inflammatory changes. Spleen: Normal in size without focal abnormality. Adrenals/Urinary Tract: Normal adrenal glands. Punctate nonobstructing right intrarenal calculi in the midpole region. No acute renal obstruction, perinephric inflammatory process, obstructive uropathy, hydronephrosis, hydroureter, or obstructing ureteral calculus on either side. Urinary bladder unremarkable. Stomach/Bowel: Negative for bowel obstruction, significant dilatation, ileus, or free air. No fluid collection or abscess. Normal appendix demonstrated. There are 2 small anterior midline ventral hernias which contain a portion of loops of bowel but no associated incarceration or obstruction. Vascular/Lymphatic: Aortic atherosclerosis noted. No aneurysm. No retroperitoneal abnormality or hemorrhage. No adenopathy. Reproductive: Status post hysterectomy. No adnexal masses. Other: No inguinal abnormality or adenopathy. Musculoskeletal: Degenerative changes noted of the spine. Progressive endplate changes with bone loss at the L3-4 with loss of endplate cortical margins. This is concerning for spondylo discitis possibly chronic versus an osseous destructive lesion (metastatic disease or myeloma). Consider further evaluation with lumbar spine MRI with contrast if patient has back pain. IMPRESSION: No acute intra-abdominal or pelvic finding by noncontrast CT. 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 No acute obstructing urinary tract calculus or obstructive hydronephrosis. Two small midline ventral hernias containing portions of bowel loops but no associated obstruction or  incarceration. No fluid collection or abscess Aortic atherosclerosis without aneurysm Progressive L3-4 endplate changes and disc space process since 02/13/2016. Appearance remains nonspecific for destructive bone lesion (metastatic disease or myeloma) versus spondylo discitis. Consider further evaluation with MRI without and with contrast. Electronically Signed   By: Jerilynn Mages.  Shick M.D.   On: 01/22/2017 15:01   Dg Chest 2 View  Result Date: 01/22/2017 CLINICAL DATA:  71 year old female with left-sided chest pain for the past month. EXAM: CHEST  2 VIEW COMPARISON:  Prior chest x-ray 02/18/2016 FINDINGS: Stable cardiac and mediastinal contours. Similar appearance of the lungs with hyperinflation and mild bronchitic changes.  No evidence of pulmonary edema, pleural effusion or pneumothorax. No focal airspace consolidation. No acute osseous abnormality. Mild multilevel degenerative endplate changes and bilateral glenohumeral joint osteoarthritis. IMPRESSION: Stable chest x-ray without evidence of acute cardiopulmonary process. Electronically Signed   By: Jacqulynn Cadet M.D.   On: 01/22/2017 14:55    Procedures Procedures (including critical care time)  Medications Ordered in ED Medications  meclizine (ANTIVERT) tablet 12.5 mg (not administered)  oxyCODONE-acetaminophen (PERCOCET/ROXICET) 5-325 MG per tablet 1 tablet (1 tablet Oral Given 01/22/17 1413)     Initial Impression / Assessment and Plan / ED Course  I have reviewed the triage vital signs and the nursing notes.  Pertinent labs & imaging results that were available during my care of the patient were reviewed by me and considered in my medical decision making (see chart for details).     BP (!) 145/114 Comment: Taken twice  Pulse 64   Temp 98.6 F (37 C) (Oral)   Resp 18   Ht 5' 2" (1.575 m)   Wt 80.7 kg (178 lb)   SpO2 96%   BMI 32.56 kg/m    Final Clinical Impressions(s) / ED Diagnoses   Final diagnoses:  Neuropathic pain of  left flank    New Prescriptions New Prescriptions   MECLIZINE (ANTIVERT) 12.5 MG TABLET    Take 1 tablet (12.5 mg total) by mouth 3 (three) times daily as needed for dizziness.   OXYCODONE-ACETAMINOPHEN (PERCOCET/ROXICET) 5-325 MG TABLET    Take 1 tablet by mouth every 6 (six) hours as needed for severe pain.   1:52 PM Patient here with persistent dermatomal pain to her left side of back and chest suggestive of postherpetic neuralgia given the duration of her symptoms and history of prior shingles. She does have history of colon cancer but does not have any B symptoms. Given her age, will check basic labs, obtain screening abdomen and pelvis CT scan, and provide pain management. Care discussed with Dr. Dorie Rank.  3:19 PM Screening exam performed in the ER. CXR without concerning feature, labs are reassuing, EKG without ischemic changes, abd/pelvis CT with no acute intra-abdominal or pelvic finding by non contrast CT.  Several incidental changes noted.  There's progressive L3-L4 endplate changes, which is nonspecific for destructive bone lesion vs spondylo discitis.  Pt's pain is not consistent with this finding.  She can f/u outpt for further MRI imaging as indicated.  Suspect her chronic pain is likely 2/2 post herpetic neuralgia.  Will prescribe percocet for pain management.  F/u with her PCP for further care.  In order to decrease risk of narcotic abuse. Pt's record were checked using the [place] Controlled Substance database.     Domenic Moras, PA-C 01/22/17 1533    Dorie Rank, MD 01/22/17 [phone]

## 2017-02-07 ENCOUNTER — Emergency Department (HOSPITAL_COMMUNITY)
Admission: EM | Admit: 2017-02-07 | Discharge: 2017-02-07 | Disposition: A | Discharge status: Home / Self Care | Payer: Medicare Other | Attending: Emergency Medicine | Admitting: Emergency Medicine

## 2017-02-07 ENCOUNTER — Encounter (HOSPITAL_COMMUNITY): Payer: Self-pay | Admitting: Emergency Medicine

## 2017-02-07 DIAGNOSIS — B0229 Other postherpetic nervous system involvement: Secondary | ICD-10-CM | POA: Diagnosis not present

## 2017-02-07 DIAGNOSIS — Z79899 Other long term (current) drug therapy: Secondary | ICD-10-CM | POA: Insufficient documentation

## 2017-02-07 DIAGNOSIS — J45909 Unspecified asthma, uncomplicated: Secondary | ICD-10-CM | POA: Insufficient documentation

## 2017-02-07 DIAGNOSIS — Z791 Long term (current) use of non-steroidal anti-inflammatories (NSAID): Secondary | ICD-10-CM | POA: Insufficient documentation

## 2017-02-07 DIAGNOSIS — R109 Unspecified abdominal pain: Secondary | ICD-10-CM | POA: Diagnosis present

## 2017-02-07 DIAGNOSIS — I10 Essential (primary) hypertension: Secondary | ICD-10-CM | POA: Diagnosis not present

## 2017-02-07 DIAGNOSIS — Z7982 Long term (current) use of aspirin: Secondary | ICD-10-CM | POA: Insufficient documentation

## 2017-02-07 LAB — URINALYSIS, ROUTINE W REFLEX MICROSCOPIC
BACTERIA UA: NONE SEEN
Bilirubin Urine: NEGATIVE
GLUCOSE, UA: NEGATIVE mg/dL
HGB URINE DIPSTICK: NEGATIVE
KETONES UR: NEGATIVE mg/dL
NITRITE: NEGATIVE
PH: 6 (ref 5.0–8.0)
PROTEIN: NEGATIVE mg/dL
Specific Gravity, Urine: 1.015 (ref 1.005–1.030)

## 2017-02-07 MED ORDER — HYDROCODONE-ACETAMINOPHEN 5-325 MG PO TABS
1.0000 | ORAL_TABLET | Freq: Once | ORAL | Status: AC
  Administered 2017-02-07: 1 via ORAL
  Filled 2017-02-07: qty 1

## 2017-02-07 NOTE — ED Provider Notes (Signed)
[facility] DEPT Provider Note   CSN: [id number] Arrival date & time: 02/07/17  4268     History   Chief Complaint Chief Complaint  Patient presents with  . Abdominal Pain  . Breast Pain    HPI [name] is a 71 y.o. female.  71yo F w/ PMH including shingles, DVT, HTN, colon cancer who p/w L side pain.  A few years ago, the patient developed shingles on her left lateral chest wrapping around to her left thoracic back.  Since then, she has had chronic ongoing pain in this area including her left breast and side.  She has been evaluated by her PCP, currently on gabapentin, has been referred to a specialist but has not yet made an appointment.  She states that she has been taking her medications but this morning her pain got worse and her medication did not help.  She denies any fevers, vomiting, diarrhea, urinary symptoms, or recent illness.  She notes that she was evaluated in the ED last month for the same symptoms.   The history is provided by the patient.  Abdominal Pain      Past Medical History:  Diagnosis Date  . Anemia   . Arthritis   . Asthma   . Clotting disorder ([place])   . Colon cancer ([place])   . DVT (deep venous thrombosis) ([facility])    right jugular  . Essential hypertension 02/20/2016  . Hypertension   . 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 PONV (postoperative nausea and vomiting)   . Seizures ([place])   . Shingles rash   . Spinal headache     Patient Active Problem List   Diagnosis Date Noted  . Essential hypertension 02/20/2016  . Small bowel obstruction ([place]) 02/13/2016  . Seizures ([place]) 05/08/2015  . 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 Malignant neoplasm of colon ([place]) 02/07/2011  . Breast mass seen on mammogram 02/02/2011  . History of colon cancer, stage IV 02/02/2011    Past Surgical History:  Procedure Laterality Date  .  ABDOMINAL HYSTERECTOMY  2012  . COLON SURGERY    . COLONOSCOPY    . PORTACATH PLACEMENT    . RADIOFREQUENCY ABLATION LIVER TUMOR    . SMALL INTESTINE SURGERY      OB History    No data available       Home Medications    Prior to Admission medications   Medication Sig Start Date End Date Taking? Authorizing Provider  aspirin EC 81 MG tablet Take 81 mg by mouth daily.   Yes [provider]  cloNIDine (CATAPRES) 0.1 MG tablet Take 0.1 mg by mouth 2 (two) times daily.    Yes [provider]  diltiazem (CARDIZEM CD) 180 MG 24 hr capsule Take 180 mg by mouth daily. 12/06/13  Yes [provider]  escitalopram (LEXAPRO) 10 MG tablet Take 10 mg by mouth daily. 01/01/17  Yes [provider]  Fluticasone-Salmeterol (ADVAIR) 100-50 MCG/DOSE AEPB Inhale 2 puffs into the lungs 2 (two) times daily as needed (wheezing).    Yes [provider]  furosemide (LASIX) 20 MG tablet Take 20 mg by mouth daily.   Yes [provider]  gabapentin (NEURONTIN) 300 MG capsule Take 600 mg by mouth 3 (three) times daily. 01/18/17  Yes [provider]  levETIRAcetam (KEPPRA XR) 500 MG 24 hr tablet Take 500 mg by mouth  2 (two) times daily. 01/18/17  Yes [provider]  lidocaine (LIDODERM) 5 % Place 1 patch onto the skin daily. Apply at 8 am and remove at 8 pm 01/06/17  Yes [provider]  losartan-hydrochlorothiazide (HYZAAR) 100-12.5 MG tablet Take 1 tablet by mouth daily.   Yes [provider]  meclizine (ANTIVERT) 12.5 MG tablet Take 1 tablet (12.5 mg total) by mouth 3 (three) times daily as needed for dizziness. 01/22/17  Yes Domenic Moras, PA-C  meloxicam (MOBIC) 15 MG tablet Take 15 mg by mouth daily. 05/14/15  Yes [provider]  memantine (NAMENDA) 5 MG tablet Take 5 mg by mouth daily. 01/18/17  Yes [provider]  Multiple Vitamins-Minerals (CENTRUM SILVER ADULT 50+ PO) Take 1 tablet by mouth daily.   Yes  [provider]  oxyCODONE-acetaminophen (PERCOCET/ROXICET) 5-325 MG tablet Take 1 tablet by mouth every 6 (six) hours as needed for severe pain. 01/22/17  Yes Domenic Moras, PA-C  polyethylene glycol (MIRALAX / GLYCOLAX) packet Take 17 g by mouth daily as needed for mild constipation.   Yes [provider]  potassium chloride SA (K-DUR,KLOR-CON) 20 MEQ tablet Take 20 mEq by mouth daily.    Yes [provider]  traZODone (DESYREL) 150 MG tablet Take 150 mg at bedtime as needed by mouth for sleep.  01/18/17  Yes [provider]    Family History Family History  Problem Relation Age of Onset  . Hypertension Father   . Cancer Brother        colon  . Cancer Daughter        colon  . Breast cancer Sister     Social History Social History   Tobacco Use  . Smoking status: Never Smoker  . Smokeless tobacco: Never Used  Substance Use Topics  . Alcohol use: No  . Drug use: No     Allergies   Patient has no known allergies.   Review of Systems Review of Systems  Gastrointestinal: Positive for abdominal pain.   All other systems reviewed and are negative except that which was mentioned in HPI   Physical Exam Updated Vital Signs BP 132/73   Pulse 84   Temp 98.2 F (36.8 C) (Oral)   Resp 16   SpO2 94%   Physical Exam  Constitutional: She is oriented to person, place, and time. She appears well-developed and well-nourished. No distress.  HENT:  Head: Normocephalic and atraumatic.  Moist mucous membranes  Eyes: Conjunctivae are normal. Pupils are equal, round, and reactive to light.  Neck: Neck supple.  Cardiovascular: Normal rate, regular rhythm and normal heart sounds.  No murmur heard. Pulmonary/Chest: Effort normal and breath sounds normal.  Abdominal: Soft. Bowel sounds are normal. She exhibits no distension. There is no tenderness.  Musculoskeletal: She exhibits no edema.  Tenderness to light touch of left upper side from lateral  breast to mid left thoracic back  Neurological: She is alert and oriented to person, place, and time.  Fluent speech  Skin: Skin is warm and dry.  Scattered scars just below L breast wrapping around L side  Psychiatric: She has a normal mood and affect. Judgment normal.  Nursing note and vitals reviewed.    ED Treatments / Results  Labs (all labs ordered are listed, but only abnormal results are displayed) Labs Reviewed  URINALYSIS, ROUTINE W REFLEX MICROSCOPIC - Abnormal; Notable for the following components:      Result Value   Leukocytes, UA MODERATE (*)  Squamous Epithelial / LPF 0-5 (*)    All other components within normal limits  URINE CULTURE    EKG  EKG Interpretation None       Radiology No results found.  Procedures Procedures (including critical care time)  Medications Ordered in ED Medications  HYDROcodone-acetaminophen (NORCO/VICODIN) 5-325 MG per tablet 1 tablet (1 tablet Oral Given 02/07/17 1203)     Initial Impression / Assessment and Plan / ED Course  I have reviewed the triage vital signs and the nursing notes.  Pertinent labs  that were available during my care of the patient were reviewed by me and considered in my medical decision making (see chart for details).     Pt w/ ongoing L side pain after having shingles a few years ago, p/w worsening of chronic pain this morning.  She was well-appearing on exam with normal vital signs.  No new rashes on exam.  No infectious symptoms.  No focal abdominal tenderness.  She states this is the same pain that she has had previously and I suspect postherpetic neuralgia.  She is on max dose of Neurontin therefore I have recommended that she follow-up with pain specialist regarding her ongoing symptoms.  She will call the specialist whose contact info she was given by PCP. UA with some WBC, pt denies sx therefore just added culture. Discussed supportive measures and patient discharged in satisfactory  condition.  Final Clinical Impressions(s) / ED Diagnoses   Final diagnoses:  Post herpetic neuralgia    ED Discharge Orders    None       Samba Cumba, Wenda Overland, MD 02/07/17 1220

## 2017-02-07 NOTE — ED Triage Notes (Signed)
Pt reports L side abd pain and L breast pain for the past few months. Pt reports she was seen for the same before, but medications given not helping. Pt denies CP. No n/v/d.

## 2017-02-09 LAB — URINE CULTURE

## 2017-03-03 ENCOUNTER — Other Ambulatory Visit: Payer: Self-pay | Admitting: Oncology

## 2017-03-03 DIAGNOSIS — D472 Monoclonal gammopathy: Secondary | ICD-10-CM

## 2017-03-07 ENCOUNTER — Telehealth: Payer: Self-pay | Admitting: Oncology

## 2017-03-07 NOTE — Telephone Encounter (Signed)
Lft the pt a vm with the appt date and time in March 2019

## 2017-03-25 ENCOUNTER — Telehealth: Payer: Self-pay | Admitting: Oncology

## 2017-03-25 NOTE — Telephone Encounter (Signed)
Spoke with patient re f/u with FS 12/24 @ 9am. Date/time per FS. Received call from Dr. Otho Ket office re patient needing urgent appointment.

## 2017-03-26 ENCOUNTER — Encounter (HOSPITAL_COMMUNITY): Payer: Self-pay | Admitting: Emergency Medicine

## 2017-03-26 ENCOUNTER — Emergency Department (HOSPITAL_COMMUNITY)
Admission: EM | Admit: 2017-03-26 | Discharge: 2017-03-26 | Disposition: A | Discharge status: Home / Self Care | Payer: Medicare Other | Attending: Emergency Medicine | Admitting: Emergency Medicine

## 2017-03-26 ENCOUNTER — Other Ambulatory Visit: Payer: Self-pay

## 2017-03-26 DIAGNOSIS — M792 Neuralgia and neuritis, unspecified: Secondary | ICD-10-CM | POA: Insufficient documentation

## 2017-03-26 DIAGNOSIS — Z79899 Other long term (current) drug therapy: Secondary | ICD-10-CM | POA: Insufficient documentation

## 2017-03-26 DIAGNOSIS — I10 Essential (primary) hypertension: Secondary | ICD-10-CM | POA: Insufficient documentation

## 2017-03-26 DIAGNOSIS — Z86718 Personal history of other venous thrombosis and embolism: Secondary | ICD-10-CM | POA: Diagnosis not present

## 2017-03-26 DIAGNOSIS — J45909 Unspecified asthma, uncomplicated: Secondary | ICD-10-CM | POA: Insufficient documentation

## 2017-03-26 DIAGNOSIS — Z7982 Long term (current) use of aspirin: Secondary | ICD-10-CM | POA: Diagnosis not present

## 2017-03-26 DIAGNOSIS — R1084 Generalized abdominal pain: Secondary | ICD-10-CM | POA: Diagnosis present

## 2017-03-26 LAB — COMPREHENSIVE METABOLIC PANEL
ALT: 11 U/L — AB (ref 14–54)
AST: 26 U/L (ref 15–41)
Albumin: 3.1 g/dL — ABNORMAL LOW (ref 3.5–5.0)
Alkaline Phosphatase: 54 U/L (ref 38–126)
Anion gap: 6 (ref 5–15)
BUN: 10 mg/dL (ref 6–20)
CHLORIDE: 110 mmol/L (ref 101–111)
CO2: 25 mmol/L (ref 22–32)
CREATININE: 0.87 mg/dL (ref 0.44–1.00)
Calcium: 8.2 mg/dL — ABNORMAL LOW (ref 8.9–10.3)
GFR calc non Af Amer: >60 mL/min (ref >60)
Glucose, Bld: 111 mg/dL — ABNORMAL HIGH (ref 65–99)
Potassium: 3.9 mmol/L (ref 3.5–5.1)
SODIUM: 141 mmol/L (ref 135–145)
Total Bilirubin: 0.4 mg/dL (ref 0.3–1.2)
Total Protein: 6.5 g/dL (ref 6.5–8.1)

## 2017-03-26 LAB — CBC
HCT: 38.1 % (ref 36.0–46.0)
Hemoglobin: 12.3 g/dL (ref 12.0–15.0)
MCH: 31.3 pg (ref 26.0–34.0)
MCHC: 32.3 g/dL (ref 30.0–36.0)
MCV: 96.9 fL (ref 78.0–100.0)
PLATELETS: 118 10*3/uL — AB (ref 150–400)
RBC: 3.93 MIL/uL (ref 3.87–5.11)
RDW: 12.6 % (ref 11.5–15.5)
WBC: 2.9 10*3/uL — ABNORMAL LOW (ref 4.0–10.5)

## 2017-03-26 LAB — URINALYSIS, ROUTINE W REFLEX MICROSCOPIC
Bilirubin Urine: NEGATIVE
GLUCOSE, UA: NEGATIVE mg/dL
HGB URINE DIPSTICK: NEGATIVE
Ketones, ur: NEGATIVE mg/dL
Leukocytes, UA: NEGATIVE
Nitrite: NEGATIVE
PH: 5 (ref 5.0–8.0)
PROTEIN: NEGATIVE mg/dL
SPECIFIC GRAVITY, URINE: 1.03 (ref 1.005–1.030)

## 2017-03-26 LAB — LIPASE, BLOOD: LIPASE: 48 U/L (ref 11–51)

## 2017-03-26 NOTE — Discharge Instructions (Signed)
Your lab work was normal today. I suspect her pain is from your shingles outbreak. Continue your pain medication regimen at home. Follow-up with your oncologist on Monday as scheduled. Return to ED for fevers, chest pain, shortness of breath, cough, chills, any abdominal pain.

## 2017-03-26 NOTE — ED Notes (Signed)
Patient given discharge instructions and verbalized understanding.  Patient stable to discharge at this time.  Patient is alert and oriented to baseline.  No distressed noted at this time.  All belongings taken with the patient at discharge.   

## 2017-03-26 NOTE — ED Provider Notes (Signed)
[facility] EMERGENCY DEPARTMENT Provider Note   CSN: [id number] Arrival date & time: 03/26/17  1941     History   Chief Complaint Chief Complaint  Patient presents with  . Abdominal Pain    left flank    HPI [name] is a 71 y.o. female with history of colon cancer status post radiation and chemotherapy currently on surveillance, DVT, hypertension, shingles complicated by post herpetic neuralgia presents for evaluation of acute on chronic left flank pain that radiates to her left upper quadrant and below her left breast. Pain is worse with light graze of the skin, palpation, and occasionally with changing positions. Non exertional, non pleuritic. She attributes this pain to her shingles pain stating this is the area where she had her shingles rash and where she has her shingles pain. Takes hydrocodone, gabapentin and lidocaine patches q6h that typically controlled the pain however this morning at 2 AM when she took her medications the pain did not improve. Pain was 8-10/10. She came to the ED and to occur second round of medications approximately 2.5 hours ago and now states that her pain is significantly better. She does not want further pain control at this time.   No falls or direct trauma. No new rashes. No fevers, chills, cough, exertional shortness of breath or chest pain. No new abdominal pain. No nausea, vomiting, indigestion, changes in BM. Able to pass flatus. Does report internal vaginal pain, intermittent for several weeks that is not during urination. No other urinary symptoms. No h/o kidney stones.   HPI  Past Medical History:  Diagnosis Date  . Anemia   . Arthritis   . Asthma   . Clotting disorder ([place])   . Colon cancer ([place])   . DVT (deep venous thrombosis) ([facility])    right jugular  . Essential hypertension 02/20/2016  . Hypertension   . 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 PONV (postoperative nausea and vomiting)    . Seizures ([place])   . Shingles rash   . Spinal headache     Patient Active Problem List   Diagnosis Date Noted  . Essential hypertension 02/20/2016  . Small bowel obstruction ([place]) 02/13/2016  . Seizures ([place]) 05/08/2015  . 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 Malignant neoplasm of colon ([place]) 02/07/2011  . Breast mass seen on mammogram 02/02/2011  . History of colon cancer, stage IV 02/02/2011    Past Surgical History:  Procedure Laterality Date  . ABDOMINAL HYSTERECTOMY  2012  . COLON SURGERY    . COLONOSCOPY    . PORTACATH PLACEMENT    . RADIOFREQUENCY ABLATION LIVER TUMOR    . SMALL INTESTINE SURGERY      OB History    No data available       Home Medications    Prior to Admission medications   Medication Sig Start Date End Date Taking? Authorizing Provider  aspirin EC 81 MG tablet Take 81 mg by mouth daily.   Yes [provider]  cloNIDine (CATAPRES) 0.1 MG tablet Take 0.1 mg by mouth 2 (two) times daily.    Yes [provider]  diltiazem (CARDIZEM CD) 180 MG 24 hr capsule Take 180 mg by mouth daily. 12/06/13  Yes [provider]  escitalopram (LEXAPRO) 10 MG tablet Take 10 mg by mouth daily. 01/01/17  Yes [provider]  Fluticasone-Salmeterol (ADVAIR) 100-50 MCG/DOSE AEPB Inhale 2  puffs into the lungs 2 (two) times daily as needed (wheezing).    Yes [provider]  furosemide (LASIX) 20 MG tablet Take 20 mg by mouth daily.   Yes [provider]  gabapentin (NEURONTIN) 300 MG capsule Take 600 mg by mouth 3 (three) times daily. 01/18/17  Yes [provider]  levETIRAcetam (KEPPRA XR) 500 MG 24 hr tablet Take 500 mg by mouth 2 (two) times daily. 01/18/17  Yes [provider]  lidocaine (LIDODERM) 5 % Place 1 patch onto the skin daily. Apply at 8 am and remove at 8 pm 01/06/17  Yes [provider]  losartan-hydrochlorothiazide  (HYZAAR) 100-12.5 MG tablet Take 1 tablet by mouth daily.   Yes [provider]  meclizine (ANTIVERT) 12.5 MG tablet Take 1 tablet (12.5 mg total) by mouth 3 (three) times daily as needed for dizziness. 01/22/17  Yes Domenic Moras, PA-C  meloxicam (MOBIC) 15 MG tablet Take 15 mg by mouth daily. 05/14/15  Yes [provider]  memantine (NAMENDA) 5 MG tablet Take 5 mg by mouth daily. 01/18/17  Yes [provider]  Multiple Vitamins-Minerals (CENTRUM SILVER ADULT 50+ PO) Take 1 tablet by mouth daily.   Yes [provider]  oxyCODONE-acetaminophen (PERCOCET/ROXICET) 5-325 MG tablet Take 1 tablet by mouth every 6 (six) hours as needed for severe pain. 01/22/17  Yes Domenic Moras, PA-C  polyethylene glycol (MIRALAX / GLYCOLAX) packet Take 17 g by mouth daily as needed for mild constipation.   Yes [provider]  potassium chloride SA (K-DUR,KLOR-CON) 20 MEQ tablet Take 20 mEq by mouth daily.    Yes [provider]  traZODone (DESYREL) 150 MG tablet Take 150 mg at bedtime as needed by mouth for sleep.  01/18/17  Yes [provider]    Family History Family History  Problem Relation Age of Onset  . Hypertension Father   . Cancer Brother        colon  . Cancer Daughter        colon  . Breast cancer Sister     Social History Social History   Tobacco Use  . Smoking status: Never Smoker  . Smokeless tobacco: Never Used  Substance Use Topics  . Alcohol use: No  . Drug use: No     Allergies   Patient has no known allergies.   Review of Systems Review of Systems  Gastrointestinal: Positive for abdominal pain.  Genitourinary: Positive for flank pain.  Skin:       +skin tenderness over left flank and LUQ abdomen  Allergic/Immunologic: Positive for immunocompromised state (h/o colon cancer).  All other systems reviewed and are negative.    Physical Exam Updated Vital Signs BP (!) 145/66   Pulse 65   Temp 98.6 F (37 C)  (Oral)   Resp 18   Ht '5\' 2"'$  (1.575 m)   Wt 78.5 kg (173 lb)   SpO2 99%   BMI 31.64 kg/m   Physical Exam  Constitutional: She is oriented to person, place, and time. She appears well-developed and well-nourished. No distress.  NAD. Non toxic.   HENT:  Head: Normocephalic and atraumatic.  Right Ear: External ear normal.  Left Ear: External ear normal.  Nose: Nose normal.  Moist mucous membranes. Oropharynx and tonsils normal. No thrush.   Eyes: Conjunctivae and EOM are normal. No scleral icterus.  Neck: Normal range of motion. Neck supple.  Cardiovascular: Normal rate, regular rhythm and normal heart sounds.  No murmur heard. 2+  DP and radial pulses bilaterally. No LE edema or calf tenderness.   Pulmonary/Chest: Effort normal and breath sounds normal. She has no wheezes.  TTP at lower ribs posteriorly, laterally and anteriorly.   Abdominal: Soft. Normal appearance and bowel sounds are normal. There is no tenderness.  Questionable left CVA tenderness, pt has pain at CVA but worse pain with graze of skin than deep palpation or percussion of CVA on left. Abdomen soft NTND. No epigastric tenderness. Negative Murphy's and McBurney's.   Musculoskeletal: Normal range of motion. She exhibits no deformity.  Neurological: She is alert and oriented to person, place, and time.  Skin: Skin is warm and dry. Capillary refill takes less than 2 seconds.     Diffuse skin tenderness with light graze of skin at left lower ribs A/P/Laterally and over left CVA. No rash.   Psychiatric: She has a normal mood and affect. Her behavior is normal. Judgment and thought content normal.  Nursing note and vitals reviewed.    ED Treatments / Results  Labs (all labs ordered are listed, but only abnormal results are displayed) Labs Reviewed  COMPREHENSIVE METABOLIC PANEL - Abnormal; Notable for the following components:      Result Value   Glucose, Bld 111 (*)    Calcium 8.2 (*)    Albumin 3.1 (*)    ALT 11  (*)    All other components within normal limits  CBC - Abnormal; Notable for the following components:   WBC 2.9 (*)    Platelets 118 (*)    All other components within normal limits  LIPASE, BLOOD  URINALYSIS, ROUTINE W REFLEX MICROSCOPIC    EKG  EKG Interpretation  Date/Time:  Saturday March 26 2017 10:45:58 EST Ventricular Rate:  58 PR Interval:    QRS Duration: 95 QT Interval:  405 QTC Calculation: 398 R Axis:   58 Text Interpretation:  Sinus rhythm Atrial premature complexes Nonspecific T abnormalities, lateral leads Confirmed by Davonna Belling [phone]) on 03/26/2017 10:48:52 AM       Radiology No results found.  Procedures Procedures (including critical care time)  Medications Ordered in ED Medications - No data to display   Initial Impression / Assessment and Plan / ED Course  I have reviewed the triage vital signs and the nursing notes.  Pertinent labs & imaging results that were available during my care of the patient were reviewed by me and considered in my medical decision making (see chart for details).  Clinical Course as of Mar 26 1156  Sat Mar 26, 2017  1152 Reevaluated patient. Discussed benign 8 ED workup so far. Pain is well-controlled and she still not wanting further analgesia in ED. Discussed plan to discharge, her an daughter but that agreeable.  [CG]    Clinical Course User Index [CG] Kinnie Feil, PA-C   71 year old female with pertinent past medical history of shingles complicated by postherpetic neuralgia presents to ED for acute on chronic skin tenderness from left flank area that wraps around lateral chest wall in the anterior rib/left upper quadrant abdominal area. She attributes this pain to her shingles rash and pain. On my exam, patient reports significant improvement in pain after last dose of hydrocodone, gabapentin while in the waiting room. She is declining further analgesia in ED. Considered pulmonary embolism, pneumonia,  ACS, abdominal etiology however these don't fit symptomatology and clinical exam. She has no other systemic symptoms to raise suspicion for these. We'll plan on belly labs, troponin, EKG and reassess.  Lab work and urinalysis unremarkable today. Reevaluation is benign. I don't think further emergent lab work or imaging is indicated today. Pain most likely from postherpetic neuralgia versus musculoskeletal. Low suspicion for respiratory or cardiac etiology. She has no abdominal tenderness on exam or changes in bowel movements, nausea or vomiting to raise suspicion for GI cause.  Final Clinical Impressions(s) / ED Diagnoses   Final diagnoses:  Neuralgia of flank, left    ED Discharge Orders    None       Arlean Hopping 03/26/17 1157    Davonna Belling, MD 03/26/17 1537

## 2017-03-26 NOTE — ED Triage Notes (Signed)
Pt. Stated, I've had left side pain since September taking Hydrocodone for pain but its not working now.

## 2017-03-28 ENCOUNTER — Ambulatory Visit (HOSPITAL_BASED_OUTPATIENT_CLINIC_OR_DEPARTMENT_OTHER): Payer: Medicare Other

## 2017-03-28 ENCOUNTER — Telehealth: Payer: Self-pay | Admitting: Oncology

## 2017-03-28 ENCOUNTER — Ambulatory Visit (HOSPITAL_BASED_OUTPATIENT_CLINIC_OR_DEPARTMENT_OTHER): Payer: Medicare Other | Admitting: Oncology

## 2017-03-28 DIAGNOSIS — Z85038 Personal history of other malignant neoplasm of large intestine: Secondary | ICD-10-CM | POA: Diagnosis not present

## 2017-03-28 DIAGNOSIS — D72818 Other decreased white blood cell count: Secondary | ICD-10-CM

## 2017-03-28 DIAGNOSIS — Z8505 Personal history of malignant neoplasm of liver: Secondary | ICD-10-CM | POA: Diagnosis not present

## 2017-03-28 DIAGNOSIS — R937 Abnormal findings on diagnostic imaging of other parts of musculoskeletal system: Secondary | ICD-10-CM

## 2017-03-28 LAB — COMPREHENSIVE METABOLIC PANEL
ALBUMIN: 3.1 g/dL — AB (ref 3.5–5.0)
ALK PHOS: 54 U/L (ref 40–150)
ALT: 10 U/L (ref 0–55)
AST: 17 U/L (ref 5–34)
Anion Gap: 8 mEq/L (ref 3–11)
BUN: 10.8 mg/dL (ref 7.0–26.0)
CALCIUM: 8.7 mg/dL (ref 8.4–10.4)
CHLORIDE: 106 meq/L (ref 98–109)
CO2: 27 mEq/L (ref 22–29)
Creatinine: 0.8 mg/dL (ref 0.6–1.1)
Glucose: 137 mg/dl (ref 70–140)
POTASSIUM: 3.7 meq/L (ref 3.5–5.1)
Sodium: 140 mEq/L (ref 136–145)
Total Bilirubin: 0.34 mg/dL (ref 0.20–1.20)
Total Protein: 6.5 g/dL (ref 6.4–8.3)

## 2017-03-28 LAB — CBC WITH DIFFERENTIAL/PLATELET
BASO%: 0.5 % (ref 0.0–2.0)
BASOS ABS: 0 10*3/uL (ref 0.0–0.1)
EOS ABS: 0 10*3/uL (ref 0.0–0.5)
EOS%: 0.2 % (ref 0.0–7.0)
HEMATOCRIT: 36.8 % (ref 34.8–46.6)
HEMOGLOBIN: 12 g/dL (ref 11.6–15.9)
LYMPH%: 9.5 % — ABNORMAL LOW (ref 14.0–49.7)
MCH: 31.4 pg (ref 25.1–34.0)
MCHC: 32.7 g/dL (ref 31.5–36.0)
MCV: 96.2 fL (ref 79.5–101.0)
MONO#: 0.3 10*3/uL (ref 0.1–0.9)
MONO%: 9.2 % (ref 0.0–14.0)
NEUT#: 2.3 10*3/uL (ref 1.5–6.5)
NEUT%: 80.6 % — AB (ref 38.4–76.8)
Platelets: 106 10*3/uL — ABNORMAL LOW (ref 145–400)
RBC: 3.82 10*6/uL (ref 3.70–5.45)
RDW: 12.4 % (ref 11.2–14.5)
WBC: 2.9 10*3/uL — ABNORMAL LOW (ref 3.9–10.3)
lymph#: 0.3 10*3/uL — ABNORMAL LOW (ref 0.9–3.3)

## 2017-03-28 NOTE — Telephone Encounter (Signed)
Gave avs and calendar for December  °

## 2017-03-28 NOTE — Progress Notes (Signed)
Hematology and Oncology Follow Up Visit  [name] [id number] [birth year] 71 y.o. 03/28/2017 9:04 AM   Principle Diagnosis: 71 year old woman with stage IV colon cancer with a history of an isolated metastasis to the liver diagnosed in 04/2009. She remains disease free at this time.  Prior Therapy:  She is status post colectomy as well as radiofrequency ablation to an isolated liver met on 05/05/2009 at [facility].  She is status post chemotherapy with FOLFOX 6 and Avastin from 06/08/2009 through 12/19/2009 requiring dose reduction for thrombocytopenia and leukopenia. CT scan findings were retroperitoneal and anterior adenopathy status post CT guided core biopsy on 03/15/2011 showed no evidence  of malignancy except for fibrosis. 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   Current therapy: Observation and follow up.   Interim History: Ms. Froh presents today for a follow up visit. Since her last visit, she reported worsening of a left-sided flank and abdominal pain.  Her pain has been rather vague and diffuse and intermittent in nature.  She has had chronic pain issues related post herpetic neuralgia but this pain has increased in intensity.  She does take gabapentin as well as Percocet and as of late he does not appear to be helping.  She did have a CT scan on January 22, 2017 which showed no evidence of any malignancy in the visceral organs or lymphadenopathy.  There is an L3-L4 endplate changes and disc space process that appear to be progressive compared to the imaging studies in November 2017.  The appearance remained nonspecific for malignancy versus spondylodiscitis.  MRI was suggested to be done at this time.  Her symptoms continue to persist at this time although she does not report any neurological deficits.  She does not report any weakness in her upper or lower extremities.  She is able to ambulate without any falls or  syncope.  Her appetite remained adequate without any nausea or vomiting.    She does not report any headaches or blurry vision or syncope. She has not reported any chest pain or palpitations. She does not report any cough, wheezing or hemoptysis. She does not report any skeletal complaints without any arthralgias or myalgias. She is not on any lymphadenopathy or petechiae. She does not report any frequency urgency or hesitancy. Rest of her review of systems unremarkable.   Medications: I have reviewed the patient's current medications. Current Outpatient Medications  Medication Sig Dispense Refill  . aspirin EC 81 MG tablet Take 81 mg by mouth daily.    . cloNIDine (CATAPRES) 0.1 MG tablet Take 0.1 mg by mouth 2 (two) times daily.     Marland Kitchen diltiazem (CARDIZEM CD) 180 MG 24 hr capsule Take 180 mg by mouth daily.    Marland Kitchen escitalopram (LEXAPRO) 10 MG tablet Take 10 mg by mouth daily.  3  . Fluticasone-Salmeterol (ADVAIR) 100-50 MCG/DOSE AEPB Inhale 2 puffs into the lungs 2 (two) times daily as needed (wheezing).     . furosemide (LASIX) 20 MG tablet Take 20 mg by mouth daily.    Marland Kitchen gabapentin (NEURONTIN) 300 MG capsule Take 600 mg by mouth 3 (three) times daily.  1  . levETIRAcetam (KEPPRA XR) 500 MG 24 hr tablet Take 500 mg by mouth 2 (two) times daily.  1  . lidocaine (LIDODERM) 5 % Place 1 patch onto the skin daily. Apply at 8 am and remove at 8 pm  6  . losartan-hydrochlorothiazide (HYZAAR) 100-12.5 MG tablet Take 1  tablet by mouth daily.    . meclizine (ANTIVERT) 12.5 MG tablet Take 1 tablet (12.5 mg total) by mouth 3 (three) times daily as needed for dizziness. 30 tablet 0  . meloxicam (MOBIC) 15 MG tablet Take 15 mg by mouth daily.  1  . memantine (NAMENDA) 5 MG tablet Take 5 mg by mouth daily.  1  . Multiple Vitamins-Minerals (CENTRUM SILVER ADULT 50+ PO) Take 1 tablet by mouth daily.    Marland Kitchen oxyCODONE-acetaminophen (PERCOCET/ROXICET) 5-325 MG tablet Take 1 tablet by mouth every 6 (six) hours as  needed for severe pain. 15 tablet 0  . polyethylene glycol (MIRALAX / GLYCOLAX) packet Take 17 g by mouth daily as needed for mild constipation.    . potassium chloride SA (K-DUR,KLOR-CON) 20 MEQ tablet Take 20 mEq by mouth daily.     . traZODone (DESYREL) 150 MG tablet Take 150 mg at bedtime as needed by mouth for sleep.   3   No current facility-administered medications for this visit.     Allergies: No Known Allergies  Past Medical History, Surgical history, Social history, and Family History were reviewed and updated.   Physical Exam: Blood pressure (!) 161/76, pulse 81, temperature 99.2 F (37.3 C), temperature source Oral, resp. rate 18, height '5\' 2"'$  (1.575 m), weight 175 lb (79.4 kg), SpO2 97 %. ECOG: 1 General appearance: Alert, awake woman appeared in no distress. Head: Normocephalic, without obvious abnormality no oral ulcers or lesions. Neck: no adenopathy or masses. Lymph nodes: Cervical, supraclavicular, and axillary nodes normal. Heart:regular rate and rhythm, S1, S2 normal, no murmur, click, rub or gallop Lung:chest clear, no wheezing, rales, normal symmetric air entry Abdomin: soft, non-tender, without masses or organomegaly no rebound or guarding. EXT:no erythema, induration, or nodules Skin: No herpetic lesions noted.  Scars noted from previous herpes zoster infection noted on her left flank.  Lab Results: Lab Results  Component Value Date   WBC 2.9 (L) 03/26/2017   HGB 12.3 03/26/2017   HCT 38.1 03/26/2017   MCV 96.9 03/26/2017   PLT 118 (L) 03/26/2017     Chemistry      Component Value Date/Time   NA 141 03/26/2017 0825   NA 145 07/02/2016 0858   K 3.9 03/26/2017 0825   K 3.8 07/02/2016 0858   CL 110 03/26/2017 0825   CL 106 06/19/2012 0936   CO2 25 03/26/2017 0825   CO2 26 07/02/2016 0858   BUN 10 03/26/2017 0825   BUN 15.6 07/02/2016 0858   CREATININE 0.87 03/26/2017 0825   CREATININE 0.9 07/02/2016 0858      Component Value Date/Time    CALCIUM 8.2 (L) 03/26/2017 0825   CALCIUM 8.7 07/02/2016 0858   ALKPHOS 54 03/26/2017 0825   ALKPHOS 59 07/02/2016 0858   AST 26 03/26/2017 0825   AST 18 07/02/2016 0858   ALT 11 (L) 03/26/2017 0825   ALT 14 07/02/2016 0858   BILITOT 0.4 03/26/2017 0825   BILITOT 0.34 07/02/2016 0858      Current Outpatient Medications  Medication Sig Dispense Refill  . aspirin EC 81 MG tablet Take 81 mg by mouth daily.    . cloNIDine (CATAPRES) 0.1 MG tablet Take 0.1 mg by mouth 2 (two) times daily.     Marland Kitchen diltiazem (CARDIZEM CD) 180 MG 24 hr capsule Take 180 mg by mouth daily.    Marland Kitchen escitalopram (LEXAPRO) 10 MG tablet Take 10 mg by mouth daily.  3  . Fluticasone-Salmeterol (ADVAIR) 100-50 MCG/DOSE AEPB Inhale  2 puffs into the lungs 2 (two) times daily as needed (wheezing).     . furosemide (LASIX) 20 MG tablet Take 20 mg by mouth daily.    Marland Kitchen gabapentin (NEURONTIN) 300 MG capsule Take 600 mg by mouth 3 (three) times daily.  1  . levETIRAcetam (KEPPRA XR) 500 MG 24 hr tablet Take 500 mg by mouth 2 (two) times daily.  1  . lidocaine (LIDODERM) 5 % Place 1 patch onto the skin daily. Apply at 8 am and remove at 8 pm  6  . losartan-hydrochlorothiazide (HYZAAR) 100-12.5 MG tablet Take 1 tablet by mouth daily.    . meclizine (ANTIVERT) 12.5 MG tablet Take 1 tablet (12.5 mg total) by mouth 3 (three) times daily as needed for dizziness. 30 tablet 0  . meloxicam (MOBIC) 15 MG tablet Take 15 mg by mouth daily.  1  . memantine (NAMENDA) 5 MG tablet Take 5 mg by mouth daily.  1  . Multiple Vitamins-Minerals (CENTRUM SILVER ADULT 50+ PO) Take 1 tablet by mouth daily.    Marland Kitchen oxyCODONE-acetaminophen (PERCOCET/ROXICET) 5-325 MG tablet Take 1 tablet by mouth every 6 (six) hours as needed for severe pain. 15 tablet 0  . polyethylene glycol (MIRALAX / GLYCOLAX) packet Take 17 g by mouth daily as needed for mild constipation.    . potassium chloride SA (K-DUR,KLOR-CON) 20 MEQ tablet Take 20 mEq by mouth daily.     .  traZODone (DESYREL) 150 MG tablet Take 150 mg at bedtime as needed by mouth for sleep.   3   No current facility-administered medications for this visit.      IMPRESSION: No acute intra-abdominal or pelvic finding by noncontrast CT.  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  No acute obstructing urinary tract calculus or obstructive hydronephrosis.  Two small midline ventral hernias containing portions of bowel loops but no associated obstruction or incarceration.  No fluid collection or abscess  Aortic atherosclerosis without aneurysm  Progressive L3-4 endplate changes and disc space process since 02/13/2016. Appearance remains nonspecific for destructive bone lesion (metastatic disease or myeloma) versus spondylo discitis. Consider further evaluation with MRI without and with contrast    Impression and Plan:  71 year old woman with:  1. Stage IV colon cancer with a history of an isolated metastasis to the liver. Status post colectomy as well as radiofrequency ablation to an isolated liver met on 05/05/2009 at [facility]. Status post chemotherapy with FOLFOX 6 and Avastin from 06/08/2009 through 12/19/2009.  CT scan obtained in October 2018 showed no evidence of recurrent disease.  She will remain on active surveillance at this time.  2.  Lumbar spine changes: Her CT scan obtained in January 22, 2017 suggested of an L3-L4 endplate changes and disc space process has changed since November 2017.  These findings could be related to malignancy versus benign etiology.  I will obtain serum protein electrophoresis to evaluate her for plasma cell disorder.  I will also obtain an MRI of the spine to rule out malignancy.  3. Leukocytopenia: This has been chronic in nature and continues to be fluctuating.  4.  Left flank pain: This appears to be chronic in nature and related likely to postherpetic neuralgia.  I doubt  the lumbar spine findings are related to this pain but on evaluation is ongoing.  In the meantime she is managed by her primary care physician.  5. Followup: Will be in one week to follow her progress.  Zola Button, MD 12/24/20189:04 AM

## 2017-03-31 LAB — MULTIPLE MYELOMA PANEL, SERUM
ALBUMIN/GLOB SERPL: 1 (ref 0.7–1.7)
ALPHA 1: 0.3 g/dL (ref 0.0–0.4)
ALPHA2 GLOB SERPL ELPH-MCNC: 0.6 g/dL (ref 0.4–1.0)
Albumin SerPl Elph-Mcnc: 3 g/dL (ref 2.9–4.4)
B-GLOBULIN SERPL ELPH-MCNC: 0.8 g/dL (ref 0.7–1.3)
GAMMA GLOB SERPL ELPH-MCNC: 1.6 g/dL (ref 0.4–1.8)
GLOBULIN, TOTAL: 3.3 g/dL (ref 2.2–3.9)
IgA, Qn, Serum: 243 mg/dL (ref 64–422)
IgM, Qn, Serum: 76 mg/dL (ref 26–217)
Total Protein: 6.3 g/dL (ref 6.0–8.5)

## 2017-04-04 ENCOUNTER — Ambulatory Visit (HOSPITAL_BASED_OUTPATIENT_CLINIC_OR_DEPARTMENT_OTHER): Payer: Medicare Other | Admitting: Oncology

## 2017-04-04 ENCOUNTER — Other Ambulatory Visit: Payer: Self-pay | Admitting: *Deleted

## 2017-04-04 VITALS — BP 171/88 | HR 80 | Temp 99.0°F | Resp 17 | Ht 62.0 in | Wt 172.4 lb

## 2017-04-04 DIAGNOSIS — Z85038 Personal history of other malignant neoplasm of large intestine: Secondary | ICD-10-CM

## 2017-04-04 DIAGNOSIS — Z8505 Personal history of malignant neoplasm of liver: Secondary | ICD-10-CM

## 2017-04-04 DIAGNOSIS — R937 Abnormal findings on diagnostic imaging of other parts of musculoskeletal system: Secondary | ICD-10-CM

## 2017-04-04 NOTE — Progress Notes (Signed)
Hematology and Oncology Follow Up Visit  [name] [id number] 08/06/1945 71 y.o. 04/04/2017 8:53 AM   Principle Diagnosis: 71 year old woman with stage IV colon cancer with a history of an isolated metastasis to the liver diagnosed in 04/2009. She remains disease free at this time.  Prior Therapy:  She is status post colectomy as well as radiofrequency ablation to an isolated liver met on 05/05/2009 at [facility].  She is status post chemotherapy with FOLFOX 6 and Avastin from 06/08/2009 through 12/19/2009 requiring dose reduction for thrombocytopenia and leukopenia. CT scan findings were retroperitoneal and anterior adenopathy status post CT guided core biopsy on 03/15/2011 showed no evidence  of malignancy except for fibrosis. 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   Current therapy: Observation and follow up.   Interim History: [name] presents today for a follow up visit. Since her last visit, she had laboratory testing but her MRI has not been completed.  Her labs were reviewed today and showed she had a normal serum protein electrophoresis, electrolytes, calcium and liver function tests.  Her CBC is not dramatically different that it has been in the past.  She continues to have issues with flank pain of despite being on Neurontin and lidocaine patches.  She takes Neurontin at 600 mg 3 times a day with some relief although not complete.  She denies any new symptoms since the last visit.  Her symptoms continue to persist at this time although she does not report any neurological deficits.  She does not report any weakness in her upper or lower extremities.  She is able to ambulate without any falls or syncope.  Her appetite remained adequate without any nausea or vomiting.    She does not report any headaches or blurry vision or syncope. She has not reported any chest pain or palpitations. She does not report any cough,  wheezing or hemoptysis. She does not report any skeletal complaints without any arthralgias or myalgias. She is not on any lymphadenopathy or petechiae. She does not report any frequency urgency or hesitancy. Rest of her review of systems unremarkable.   Medications: I have reviewed the patient's current medications. Current Outpatient Medications  Medication Sig Dispense Refill  . aspirin EC 81 MG tablet Take 81 mg by mouth daily.    . cloNIDine (CATAPRES) 0.1 MG tablet Take 0.1 mg by mouth 2 (two) times daily.     Marland Kitchen diltiazem (CARDIZEM CD) 180 MG 24 hr capsule Take 180 mg by mouth daily.    Marland Kitchen escitalopram (LEXAPRO) 10 MG tablet Take 10 mg by mouth daily.  3  . Fluticasone-Salmeterol (ADVAIR) 100-50 MCG/DOSE AEPB Inhale 2 puffs into the lungs 2 (two) times daily as needed (wheezing).     . furosemide (LASIX) 20 MG tablet Take 20 mg by mouth daily.    Marland Kitchen gabapentin (NEURONTIN) 300 MG capsule Take 600 mg by mouth 3 (three) times daily.  1  . levETIRAcetam (KEPPRA XR) 500 MG 24 hr tablet Take 500 mg by mouth 2 (two) times daily.  1  . lidocaine (LIDODERM) 5 % Place 1 patch onto the skin daily. Apply at 8 am and remove at 8 pm  6  . losartan-hydrochlorothiazide (HYZAAR) 100-12.5 MG tablet Take 1 tablet by mouth daily.    . meclizine (ANTIVERT) 12.5 MG tablet Take 1 tablet (12.5 mg total) by mouth 3 (three) times daily as needed for dizziness. 30 tablet 0  . meloxicam (MOBIC) 15 MG  tablet Take 15 mg by mouth daily.  1  . memantine (NAMENDA) 5 MG tablet Take 5 mg by mouth daily.  1  . Multiple Vitamins-Minerals (CENTRUM SILVER ADULT 50+ PO) Take 1 tablet by mouth daily.    Marland Kitchen oxyCODONE-acetaminophen (PERCOCET/ROXICET) 5-325 MG tablet Take 1 tablet by mouth every 6 (six) hours as needed for severe pain. 15 tablet 0  . polyethylene glycol (MIRALAX / GLYCOLAX) packet Take 17 g by mouth daily as needed for mild constipation.    . potassium chloride SA (K-DUR,KLOR-CON) 20 MEQ tablet Take 20 mEq by mouth  daily.     . traZODone (DESYREL) 150 MG tablet Take 150 mg at bedtime as needed by mouth for sleep.   3   No current facility-administered medications for this visit.     Allergies: No Known Allergies  Past Medical History, Surgical history, Social history, and Family History were reviewed and updated.   Physical Exam: Blood pressure (!) 171/88, pulse 80, temperature 99 F (37.2 C), temperature source Oral, resp. rate 17, height '5\' 2"'  (1.575 m), weight 172 lb 6.4 oz (78.2 kg), SpO2 96 %. ECOG: 1 General appearance: Well-appearing woman appears slightly uncomfortable. Head: Normocephalic, without obvious abnormality no oral ulcers or lesions. Neck: no adenopathy or masses. Lymph nodes: Cervical, supraclavicular, and axillary nodes normal. Heart:regular rate and rhythm, S1, S2 normal, no murmur, click, rub or gallop Lung:chest clear, no wheezing, rales, normal symmetric air entry Abdomin: soft, non-tender, without masses or organomegaly no shifting dullness or ascites. EXT:no erythema, induration, or nodules Skin:  Scars noted from previous herpes zoster infection noted on her left flank.  No new lesions or rashes.  Lab Results: Lab Results  Component Value Date   WBC 2.9 (L) 03/28/2017   HGB 12.0 03/28/2017   HCT 36.8 03/28/2017   MCV 96.2 03/28/2017   PLT 106 (L) 03/28/2017     Chemistry      Component Value Date/Time   NA 140 03/28/2017 0909   K 3.7 03/28/2017 0909   CL 110 03/26/2017 0825   CL 106 06/19/2012 0936   CO2 27 03/28/2017 0909   BUN 10.8 03/28/2017 0909   CREATININE 0.8 03/28/2017 0909      Component Value Date/Time   CALCIUM 8.7 03/28/2017 0909   ALKPHOS 54 03/28/2017 0909   AST 17 03/28/2017 0909   ALT 10 03/28/2017 0909   BILITOT 0.34 03/28/2017 0909      Current Outpatient Medications  Medication Sig Dispense Refill  . aspirin EC 81 MG tablet Take 81 mg by mouth daily.    . cloNIDine (CATAPRES) 0.1 MG tablet Take 0.1 mg by mouth 2 (two) times  daily.     Marland Kitchen diltiazem (CARDIZEM CD) 180 MG 24 hr capsule Take 180 mg by mouth daily.    Marland Kitchen escitalopram (LEXAPRO) 10 MG tablet Take 10 mg by mouth daily.  3  . Fluticasone-Salmeterol (ADVAIR) 100-50 MCG/DOSE AEPB Inhale 2 puffs into the lungs 2 (two) times daily as needed (wheezing).     . furosemide (LASIX) 20 MG tablet Take 20 mg by mouth daily.    Marland Kitchen gabapentin (NEURONTIN) 300 MG capsule Take 600 mg by mouth 3 (three) times daily.  1  . levETIRAcetam (KEPPRA XR) 500 MG 24 hr tablet Take 500 mg by mouth 2 (two) times daily.  1  . lidocaine (LIDODERM) 5 % Place 1 patch onto the skin daily. Apply at 8 am and remove at 8 pm  6  . losartan-hydrochlorothiazide (HYZAAR)  100-12.5 MG tablet Take 1 tablet by mouth daily.    . meclizine (ANTIVERT) 12.5 MG tablet Take 1 tablet (12.5 mg total) by mouth 3 (three) times daily as needed for dizziness. 30 tablet 0  . meloxicam (MOBIC) 15 MG tablet Take 15 mg by mouth daily.  1  . memantine (NAMENDA) 5 MG tablet Take 5 mg by mouth daily.  1  . Multiple Vitamins-Minerals (CENTRUM SILVER ADULT 50+ PO) Take 1 tablet by mouth daily.    Marland Kitchen oxyCODONE-acetaminophen (PERCOCET/ROXICET) 5-325 MG tablet Take 1 tablet by mouth every 6 (six) hours as needed for severe pain. 15 tablet 0  . polyethylene glycol (MIRALAX / GLYCOLAX) packet Take 17 g by mouth daily as needed for mild constipation.    . potassium chloride SA (K-DUR,KLOR-CON) 20 MEQ tablet Take 20 mEq by mouth daily.     . traZODone (DESYREL) 150 MG tablet Take 150 mg at bedtime as needed by mouth for sleep.   3   No current facility-administered medications for this visit.         Impression and Plan:  71 year old woman with:  1. Stage IV colon cancer with a history of an isolated metastasis to the liver. Status post colectomy as well as radiofrequency ablation to an isolated liver met on 05/05/2009 at [facility]. Status post chemotherapy with FOLFOX 6 and Avastin from 06/08/2009 through  12/19/2009.  CT scan obtained in October 2018 showed no evidence of recurrent disease.  She will remain on active surveillance at this time.  2.  Lumbar spine changes: Her CT scan obtained in January 22, 2017 suggested of an L3-L4 endplate changes and disc space process has changed since November 2017.  These findings could be related to malignancy versus benign etiology.  MRI of the spine has not been completed yet and we will expedite completion in the immediate future.  3. Leukocytopenia: This has been chronic in nature and continues to be fluctuating.  4.  Left flank pain: This appears to be chronic in nature and related likely to postherpetic neuralgia.  Although new malignancy causing spinal metastasis need to be fully evaluated.  5. Followup: Will be upon completing her MRI.       Zola Button, MD 12/31/20188:53 AM

## 2017-06-12 ENCOUNTER — Ambulatory Visit (INDEPENDENT_AMBULATORY_CARE_PROVIDER_SITE_OTHER): Payer: Medicare Other

## 2017-06-12 ENCOUNTER — Other Ambulatory Visit: Payer: Self-pay

## 2017-06-12 ENCOUNTER — Ambulatory Visit (HOSPITAL_COMMUNITY)
Admission: EM | Admit: 2017-06-12 | Discharge: 2017-06-12 | Disposition: A | Discharge status: Home / Self Care | Payer: Medicare Other | Attending: Internal Medicine | Admitting: Internal Medicine

## 2017-06-12 ENCOUNTER — Encounter (HOSPITAL_COMMUNITY): Payer: Self-pay | Admitting: *Deleted

## 2017-06-12 DIAGNOSIS — Z800 Family history of malignant neoplasm of digestive organs: Secondary | ICD-10-CM | POA: Diagnosis not present

## 2017-06-12 DIAGNOSIS — Z86718 Personal history of other venous thrombosis and embolism: Secondary | ICD-10-CM | POA: Insufficient documentation

## 2017-06-12 DIAGNOSIS — Z803 Family history of malignant neoplasm of breast: Secondary | ICD-10-CM | POA: Insufficient documentation

## 2017-06-12 DIAGNOSIS — Z8249 Family history of ischemic heart disease and other diseases of the circulatory system: Secondary | ICD-10-CM | POA: Diagnosis not present

## 2017-06-12 DIAGNOSIS — Z9889 Other specified postprocedural states: Secondary | ICD-10-CM | POA: Diagnosis not present

## 2017-06-12 DIAGNOSIS — G8929 Other chronic pain: Secondary | ICD-10-CM | POA: Insufficient documentation

## 2017-06-12 DIAGNOSIS — Z79891 Long term (current) use of opiate analgesic: Secondary | ICD-10-CM | POA: Diagnosis not present

## 2017-06-12 DIAGNOSIS — J45909 Unspecified asthma, uncomplicated: Secondary | ICD-10-CM | POA: Diagnosis not present

## 2017-06-12 DIAGNOSIS — Z90710 Acquired absence of both cervix and uterus: Secondary | ICD-10-CM | POA: Insufficient documentation

## 2017-06-12 DIAGNOSIS — C189 Malignant neoplasm of colon, unspecified: Secondary | ICD-10-CM | POA: Diagnosis not present

## 2017-06-12 DIAGNOSIS — M1990 Unspecified osteoarthritis, unspecified site: Secondary | ICD-10-CM | POA: Diagnosis not present

## 2017-06-12 DIAGNOSIS — G40409 Other generalized epilepsy and epileptic syndromes, not intractable, without status epilepticus: Secondary | ICD-10-CM | POA: Insufficient documentation

## 2017-06-12 DIAGNOSIS — R109 Unspecified abdominal pain: Secondary | ICD-10-CM | POA: Insufficient documentation

## 2017-06-12 DIAGNOSIS — Z9221 Personal history of antineoplastic chemotherapy: Secondary | ICD-10-CM | POA: Insufficient documentation

## 2017-06-12 DIAGNOSIS — Z85038 Personal history of other malignant neoplasm of large intestine: Secondary | ICD-10-CM | POA: Insufficient documentation

## 2017-06-12 DIAGNOSIS — Z79899 Other long term (current) drug therapy: Secondary | ICD-10-CM | POA: Diagnosis not present

## 2017-06-12 DIAGNOSIS — D649 Anemia, unspecified: Secondary | ICD-10-CM | POA: Diagnosis not present

## 2017-06-12 DIAGNOSIS — I10 Essential (primary) hypertension: Secondary | ICD-10-CM | POA: Insufficient documentation

## 2017-06-12 LAB — POCT URINALYSIS DIP (DEVICE)
BILIRUBIN URINE: NEGATIVE
GLUCOSE, UA: NEGATIVE mg/dL
HGB URINE DIPSTICK: NEGATIVE
NITRITE: NEGATIVE
Protein, ur: NEGATIVE mg/dL
Specific Gravity, Urine: 1.015 (ref 1.005–1.030)
UROBILINOGEN UA: 1 mg/dL (ref 0.0–1.0)
pH: 7 (ref 5.0–8.0)

## 2017-06-12 LAB — POCT I-STAT, CHEM 8
BUN: 11 mg/dL (ref 6–20)
CHLORIDE: 100 mmol/L — AB (ref 101–111)
Calcium, Ion: 1.17 mmol/L (ref 1.15–1.40)
Creatinine, Ser: 0.8 mg/dL (ref 0.44–1.00)
GLUCOSE: 95 mg/dL (ref 65–99)
HCT: 40 % (ref 36.0–46.0)
HEMOGLOBIN: 13.6 g/dL (ref 12.0–15.0)
POTASSIUM: 3.8 mmol/L (ref 3.5–5.1)
SODIUM: 138 mmol/L (ref 135–145)
TCO2: 26 mmol/L (ref 22–32)

## 2017-06-12 MED ORDER — KETOROLAC TROMETHAMINE 30 MG/ML IJ SOLN
30.0000 mg | Freq: Once | INTRAMUSCULAR | Status: AC
  Administered 2017-06-12: 30 mg via INTRAMUSCULAR

## 2017-06-12 MED ORDER — KETOROLAC TROMETHAMINE 30 MG/ML IJ SOLN
INTRAMUSCULAR | Status: AC
  Filled 2017-06-12: qty 1

## 2017-06-12 MED ORDER — DICLOFENAC SODIUM 1 % TD GEL: 2.0000 g | Freq: Four times a day (QID) | TRANSDERMAL | 0 refills | Status: DC

## 2017-06-12 NOTE — ED Provider Notes (Signed)
[facility]    CSN: [id number] Arrival date & time: 06/12/17  1440     History   Chief Complaint Chief Complaint  Patient presents with  . Abdominal Pain    HPI [name] is a 72 y.o. female.   72 years old female with history of anemia, arthritis, asthma, clotting disorder, colon cancer, HTN, seizures comes in with daughter for left-sided pain that has been there for "years".  States over past year, pain became constant.  Denies any changes in symptoms, but "I just cannot take it anymore". Has been evaluated in the past and on gabapentin for possible postherpetic neuralgia, on percocet and lidocaine patches with some relief. Per oncologist note, they wanted an MRI to r/o spine metastases.  Patient and daughter seems unaware of this order, states that they have had something done recently with the neurologist, and does not know if that was the MRI.  Patient also mentions changes in urinary habits, stating she has had less urinary output.  Denies abdominal pain, nausea, vomiting, diarrhea.  Had normal bowel movements today.  Denies fever, chills, night sweats.      Past Medical History:  Diagnosis Date  . Anemia   . Arthritis   . Asthma   . Clotting disorder ([place])   . Colon cancer ([place])   . DVT (deep venous thrombosis) ([facility])    right jugular  . Essential hypertension 02/20/2016  . Hypertension   . 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 PONV (postoperative nausea and vomiting)   . Seizures ([place])   . Shingles rash   . Spinal headache     Patient Active Problem List   Diagnosis Date Noted  . Essential hypertension 02/20/2016  . Small bowel obstruction ([place]) 02/13/2016  . Seizures ([place]) 05/08/2015  . 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 Malignant neoplasm of colon ([place]) 02/07/2011  . Breast mass seen on mammogram 02/02/2011  . History of colon cancer, stage IV  02/02/2011    Past Surgical History:  Procedure Laterality Date  . ABDOMINAL HYSTERECTOMY  2012  . COLON SURGERY    . COLONOSCOPY    . PORTACATH PLACEMENT    . portacath removal    . RADIOFREQUENCY ABLATION LIVER TUMOR    . SMALL INTESTINE SURGERY      OB History    No data available       Home Medications    Prior to Admission medications   Medication Sig Start Date End Date Taking? Authorizing Provider  cloNIDine (CATAPRES) 0.1 MG tablet Take 0.1 mg by mouth 2 (two) times daily.    Yes [provider]  diltiazem (CARDIZEM CD) 180 MG 24 hr capsule Take 180 mg by mouth daily. 12/06/13  Yes [provider]  escitalopram (LEXAPRO) 10 MG tablet Take 10 mg by mouth daily. 01/01/17  Yes [provider]  Fluticasone-Salmeterol (ADVAIR) 100-50 MCG/DOSE AEPB Inhale 2 puffs into the lungs 2 (two) times daily as needed (wheezing).    Yes [provider]  gabapentin (NEURONTIN) 300 MG capsule Take 600 mg by mouth 3 (three) times daily. 01/18/17  Yes [provider]  levETIRAcetam (KEPPRA XR) 500 MG 24 hr tablet Take 500 mg by mouth 2 (two) times daily. 01/18/17  Yes [provider]  lidocaine (LIDODERM) 5 % Place 1 patch onto the skin daily. Apply at 8 am and remove at 8 pm 01/06/17  Yes [provider]  losartan-hydrochlorothiazide (HYZAAR) 100-12.5 MG tablet Take 1 tablet by mouth daily.   Yes [provider]  meclizine (ANTIVERT) 12.5 MG tablet Take 1 tablet (12.5 mg total) by mouth 3 (three) times daily as needed for dizziness. 01/22/17  Yes Domenic Moras, PA-C  memantine (NAMENDA) 5 MG tablet Take 5 mg by mouth daily. 01/18/17  Yes [provider]  Multiple Vitamins-Minerals (CENTRUM SILVER ADULT 50+ PO) Take 1 tablet by mouth daily.   Yes [provider]  oxyCODONE-acetaminophen (PERCOCET/ROXICET) 5-325 MG tablet Take 1 tablet by mouth every 6 (six) hours as needed for severe pain. 01/22/17  Yes Domenic Moras, PA-C  polyethylene glycol (MIRALAX / GLYCOLAX) packet Take 17 g by mouth daily as needed for mild constipation.   Yes [provider]  potassium chloride SA (K-DUR,KLOR-CON) 20 MEQ tablet Take 20 mEq by mouth daily.    Yes [provider]  diclofenac sodium (VOLTAREN) 1 % GEL Apply 2 g topically 4 (four) times daily. 06/12/17   Ok Edwards, PA-C    Family History Family History  Problem Relation Age of Onset  . Hypertension Father   . Cancer Brother        colon  . Cancer Daughter        colon  . Breast cancer Sister     Social History Social History   Tobacco Use  . Smoking status: Never Smoker  . Smokeless tobacco: Never Used  Substance Use Topics  . Alcohol use: No  . Drug use: No     Allergies   Patient has no known allergies.   Review of Systems Review of Systems  Reason unable to perform ROS: See HPI as above.     Physical Exam Triage Vital Signs ED Triage Vitals [06/12/17 1621]  Enc Vitals Group     BP (!) 152/60     Pulse Rate 77     Resp 20     Temp 98.7 F (37.1 C)     Temp Source Oral     SpO2 97 %     Weight      Height      Head Circumference      Peak Flow      Pain Score 8     Pain Loc      Pain Edu?      Excl. in [place]?    No data found.  Updated Vital Signs BP (!) 152/60   Pulse 77   Temp 98.7 F (37.1 C) (Oral)   Resp 20   SpO2 97%   Physical Exam  Constitutional: She is oriented to person, place, and time. She appears well-developed and well-nourished. No distress.  HENT:  Head: Normocephalic and atraumatic.  Eyes: Conjunctivae are normal. Pupils are equal, round, and reactive to light.  Cardiovascular: Normal rate, regular rhythm and normal heart sounds. Exam reveals no gallop and no friction rub.  No murmur heard. Pulmonary/Chest: Effort normal and breath sounds normal. She has no wheezes. She has no rales.  Abdominal: Normal appearance and bowel sounds are normal.  Generalized tenderness to  palpation, no obvious guarding or rebound.  Musculoskeletal:  Lidocaine patches on affected area.  No tenderness on palpation of the spinous processes. Diffuse tenderness to light touch of left  thoracic and lumbar region.  Neurological: She is alert and oriented to person, place, and time.  Skin: Skin is warm and dry.     UC Treatments / Results  Labs (all labs ordered are listed, but only abnormal results are displayed) Labs Reviewed  POCT URINALYSIS DIP (DEVICE) - Abnormal; Notable for the following components:      Result Value   Ketones, ur TRACE (*)    Leukocytes, UA SMALL (*)    All other components within normal limits  POCT I-STAT, CHEM 8 - Abnormal; Notable for the following components:   Chloride 100 (*)    All other components within normal limits  URINE CULTURE    EKG  EKG Interpretation None       Radiology Dg Abd Acute W/chest  Result Date: 06/12/2017 CLINICAL DATA:  Patient states that she had upper left sided abdominal pain for years, pain worsened over last year. Hx of asthma, hysterectomy, HTN. Non-smoker. EXAM: DG ABDOMEN ACUTE W/ 1V CHEST COMPARISON:  Chest x-ray dated 01/22/2017. Abdomen x-ray dated 02/17/2016. FINDINGS: Single-view of the chest: Heart size and mediastinal contours are stable. Coarse lung markings noted bilaterally suggesting some degree of chronic interstitial lung disease. Also suspect chronic bronchitic changes centrally. No new lung findings. No confluent opacity to suggest a developing pneumonia. No pleural effusion. Supine and upright views of the abdomen: Bowel gas pattern is nonobstructive. Moderate amount of stool within the right colon and transverse colon. No evidence of soft tissue mass or abnormal fluid collection. No evidence of free intraperitoneal air. No evidence of renal or ureteral calculi. No acute or suspicious osseous finding. IMPRESSION: 1. No active cardiopulmonary disease. No evidence of pneumonia or pulmonary edema.  Probable chronic interstitial lung disease and/or chronic bronchitic changes. 2. No evidence of acute intra-abdominal abnormality. Nonobstructive bowel gas pattern. Moderate amount of stool within the right colon and transverse colon (constipation?). Electronically Signed   By: Franki Cabot M.D.   On: 06/12/2017 17:43    Procedures Procedures (including critical care time)  Medications Ordered in UC Medications  ketorolac (TORADOL) 30 MG/ML injection 30 mg (30 mg Intramuscular Given 06/12/17 1813)     Initial Impression / Assessment and Plan / UC Course  I have reviewed the triage vital signs and the nursing notes.  Pertinent labs & imaging results that were available during my care of the patient were reviewed by me and considered in my medical decision making (see chart for details).    No alarming signs on exam.  Discussed lab results and x-ray results with patient and her daughter.  Discussed management of pain, patient with pain clinic.  Will provide Toradol injection in office today, Voltaren gel for further pain relief.  Patient will contact pain clinic tomorrow for further management and evaluation needed.  To contact oncologist tomorrow for further scheduling of MRI ordered.  Return precautions given.  Patient and daughter expresses understanding and agrees to plan.  Final Clinical Impressions(s) / UC Diagnoses   Final diagnoses:  Left flank pain, chronic    ED Discharge Orders        Ordered    diclofenac sodium (VOLTAREN) 1 % GEL  4 times daily     06/12/17 1806        Arturo Morton 06/12/17 1927

## 2017-06-12 NOTE — Discharge Instructions (Signed)
Toradol shot in the office to help with pain.  Voltaren gel on the affected area.  Please follow-up with your pain doctor to help control your pain.  Please contact your oncologist for further evaluation and management and scheduling of the MRI.  If experiencing worsening symptoms, nausea, vomiting, increased abdominal pain, go to the emergency department for further evaluation.

## 2017-06-12 NOTE — ED Triage Notes (Signed)
Pt describes left side pain x "years".  Over past year pain became constant.  No change in sxs, but "I just can't take it no more".  Area tender to palpation.

## 2017-06-13 LAB — URINE CULTURE

## 2017-07-01 ENCOUNTER — Telehealth: Payer: Self-pay | Admitting: Oncology

## 2017-07-01 ENCOUNTER — Inpatient Hospital Stay: Payer: Medicare Other | Attending: Oncology

## 2017-07-01 ENCOUNTER — Inpatient Hospital Stay (HOSPITAL_BASED_OUTPATIENT_CLINIC_OR_DEPARTMENT_OTHER): Payer: Medicare Other | Admitting: Oncology

## 2017-07-01 VITALS — BP 137/86 | HR 64 | Temp 98.7°F | Resp 18 | Ht 62.0 in | Wt 167.5 lb

## 2017-07-01 DIAGNOSIS — Z9221 Personal history of antineoplastic chemotherapy: Secondary | ICD-10-CM | POA: Diagnosis not present

## 2017-07-01 DIAGNOSIS — B0229 Other postherpetic nervous system involvement: Secondary | ICD-10-CM

## 2017-07-01 DIAGNOSIS — Z8505 Personal history of malignant neoplasm of liver: Secondary | ICD-10-CM | POA: Diagnosis not present

## 2017-07-01 DIAGNOSIS — Z79899 Other long term (current) drug therapy: Secondary | ICD-10-CM | POA: Insufficient documentation

## 2017-07-01 DIAGNOSIS — Z85038 Personal history of other malignant neoplasm of large intestine: Secondary | ICD-10-CM | POA: Diagnosis present

## 2017-07-01 DIAGNOSIS — D696 Thrombocytopenia, unspecified: Secondary | ICD-10-CM

## 2017-07-01 DIAGNOSIS — R937 Abnormal findings on diagnostic imaging of other parts of musculoskeletal system: Secondary | ICD-10-CM

## 2017-07-01 DIAGNOSIS — D72819 Decreased white blood cell count, unspecified: Secondary | ICD-10-CM | POA: Insufficient documentation

## 2017-07-01 DIAGNOSIS — M898X9 Other specified disorders of bone, unspecified site: Secondary | ICD-10-CM

## 2017-07-01 DIAGNOSIS — D472 Monoclonal gammopathy: Secondary | ICD-10-CM

## 2017-07-01 DIAGNOSIS — Z9049 Acquired absence of other specified parts of digestive tract: Secondary | ICD-10-CM | POA: Diagnosis not present

## 2017-07-01 LAB — COMPREHENSIVE METABOLIC PANEL
ALK PHOS: 59 U/L (ref 40–150)
ALT: 10 U/L (ref 0–55)
AST: 17 U/L (ref 5–34)
Albumin: 3 g/dL — ABNORMAL LOW (ref 3.5–5.0)
Anion gap: 4 (ref 3–11)
BILIRUBIN TOTAL: 0.3 mg/dL (ref 0.2–1.2)
BUN: 11 mg/dL (ref 7–26)
CALCIUM: 8.8 mg/dL (ref 8.4–10.4)
CO2: 30 mmol/L — AB (ref 22–29)
Chloride: 106 mmol/L (ref 98–109)
Creatinine, Ser: 0.85 mg/dL (ref 0.60–1.10)
GFR calc Af Amer: >60 mL/min (ref >60)
GFR calc non Af Amer: >60 mL/min (ref >60)
GLUCOSE: 90 mg/dL (ref 70–140)
Potassium: 4.2 mmol/L (ref 3.5–5.1)
SODIUM: 140 mmol/L (ref 136–145)
TOTAL PROTEIN: 6.3 g/dL — AB (ref 6.4–8.3)

## 2017-07-01 LAB — CBC WITH DIFFERENTIAL/PLATELET
BASOS PCT: 1 %
Basophils Absolute: 0 10*3/uL (ref 0.0–0.1)
EOS PCT: 1 %
Eosinophils Absolute: 0 10*3/uL (ref 0.0–0.5)
HEMATOCRIT: 36.3 % (ref 34.8–46.6)
Hemoglobin: 12 g/dL (ref 11.6–15.9)
LYMPHS PCT: 36 %
Lymphs Abs: 0.7 10*3/uL — ABNORMAL LOW (ref 0.9–3.3)
MCH: 31.7 pg (ref 25.1–34.0)
MCHC: 33.1 g/dL (ref 31.5–36.0)
MCV: 95.8 fL (ref 79.5–101.0)
MONO ABS: 0.2 10*3/uL (ref 0.1–0.9)
MONOS PCT: 11 %
Neutro Abs: 1.1 10*3/uL — ABNORMAL LOW (ref 1.5–6.5)
Neutrophils Relative %: 51 %
PLATELETS: 118 10*3/uL — AB (ref 145–400)
RBC: 3.79 MIL/uL (ref 3.70–5.45)
RDW: 12.9 % (ref 11.2–14.5)
WBC: 2 10*3/uL — ABNORMAL LOW (ref 3.9–10.3)

## 2017-07-01 NOTE — Progress Notes (Signed)
Hematology and Oncology Follow Up Visit  [name] [id number] 12-05-1945 72 y.o. 07/01/2017 3:13 PM   Principle Diagnosis: 72 year old woman with:  1. stage IV colon cancer with a history of an isolated metastasis to the liver diagnosed in 04/2009.  She is status post resection and remained disease-free.  2.  Lumbar spine changes noted incidentally on a CT scan in October 2018.  Workup is ongoing.  Prior Therapy:  She is status post colectomy as well as radiofrequency ablation to an isolated liver met on 05/05/2009 at [facility].  She is status post chemotherapy with FOLFOX 6 and Avastin from 06/08/2009 through 12/19/2009 requiring dose reduction for thrombocytopenia and leukopenia. CT scan findings were retroperitoneal and anterior adenopathy status post CT guided core biopsy on 03/15/2011 showed no evidence  of malignancy except for fibrosis. 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   Current therapy: Active surveillance.  Interim History: [name] presents today for a follow-up.  Since her last visit she reports no major changes in her health.  She continues to have issues with postherpetic neuralgia and pain on her flank.  She reports skin sensitivity which has been unchanged at this time.  She uses topical cream as well as Neurontin without any significant improvement.  She was also seen in the emergency department on 06/12/2017 for the same issue.  She has follow-up with neurology in the near future.   She does not report any headaches or blurry vision or syncope.  She does not report any fevers, chills or sweats.  Appetite not dramatically different lost some weight.  She has not reported any chest pain or palpitations. She does not report any cough, wheezing or hemoptysis. She does not report any skeletal complaints without any arthralgias or myalgias. She is not on any lymphadenopathy or petechiae. She does not report any  frequency urgency or hesitancy. Rest of her review of systems is negative.   Medications: I have reviewed the patient's current medications. Current Outpatient Medications  Medication Sig Dispense Refill  . cloNIDine (CATAPRES) 0.1 MG tablet Take 0.1 mg by mouth 2 (two) times daily.     . diclofenac sodium (VOLTAREN) 1 % GEL Apply 2 g topically 4 (four) times daily. 1 Tube 0  . diltiazem (CARDIZEM CD) 180 MG 24 hr capsule Take 180 mg by mouth daily.    Marland Kitchen escitalopram (LEXAPRO) 10 MG tablet Take 10 mg by mouth daily.  3  . Fluticasone-Salmeterol (ADVAIR) 100-50 MCG/DOSE AEPB Inhale 2 puffs into the lungs 2 (two) times daily as needed (wheezing).     . gabapentin (NEURONTIN) 300 MG capsule Take 600 mg by mouth 3 (three) times daily.  1  . levETIRAcetam (KEPPRA XR) 500 MG 24 hr tablet Take 500 mg by mouth 2 (two) times daily.  1  . lidocaine (LIDODERM) 5 % Place 1 patch onto the skin daily. Apply at 8 am and remove at 8 pm  6  . losartan-hydrochlorothiazide (HYZAAR) 100-12.5 MG tablet Take 1 tablet by mouth daily.    . meclizine (ANTIVERT) 12.5 MG tablet Take 1 tablet (12.5 mg total) by mouth 3 (three) times daily as needed for dizziness. 30 tablet 0  . memantine (NAMENDA) 5 MG tablet Take 5 mg by mouth daily.  1  . Multiple Vitamins-Minerals (CENTRUM SILVER ADULT 50+ PO) Take 1 tablet by mouth daily.    Marland Kitchen oxyCODONE-acetaminophen (PERCOCET/ROXICET) 5-325 MG tablet Take 1 tablet by mouth every 6 (six) hours  as needed for severe pain. 15 tablet 0  . polyethylene glycol (MIRALAX / GLYCOLAX) packet Take 17 g by mouth daily as needed for mild constipation.    . potassium chloride SA (K-DUR,KLOR-CON) 20 MEQ tablet Take 20 mEq by mouth daily.      No current facility-administered medications for this visit.     Allergies: No Known Allergies  Past Medical History, Surgical history, Social history, and Family History were reviewed and updated.   Physical Exam: Blood pressure 137/86, pulse 64,  temperature 98.7 F (37.1 C), temperature source Oral, resp. rate 18, height '5\' 2"'  (1.575 m), weight 167 lb 8 oz (76 kg), SpO2 99 %. ECOG: 1 General appearance: Alert, awake without any distress. Head: Atraumatic without abnormalities. Oropharynx: Without any thrush or ulcers. Lymph nodes: Cervical, supraclavicular, and axillary nodes normal. Heart:regular rate and rhythm, S1, S2 normal, no murmur, click, rub or gallop Lung:Clear to auscultation without any rhonchi, wheezes or dullness to percussion. Abdomin: Soft, nontender without any rebound or guarding.  Tender to touch on the left flank. Neurological: No deficits. Skin: No new rashes or lesions.  Lab Results: Lab Results  Component Value Date   WBC 2.9 (L) 03/28/2017   HGB 13.6 06/12/2017   HCT 40.0 06/12/2017   MCV 96.2 03/28/2017   PLT 106 (L) 03/28/2017     Chemistry      Component Value Date/Time   NA 138 06/12/2017 1743   NA 140 03/28/2017 0909   K 3.8 06/12/2017 1743   K 3.7 03/28/2017 0909   CL 100 (L) 06/12/2017 1743   CL 106 06/19/2012 0936   CO2 27 03/28/2017 0909   BUN 11 06/12/2017 1743   BUN 10.8 03/28/2017 0909   CREATININE 0.80 06/12/2017 1743   CREATININE 0.8 03/28/2017 0909      Component Value Date/Time   CALCIUM 8.7 03/28/2017 0909   ALKPHOS 54 03/28/2017 0909   AST 17 03/28/2017 0909   ALT 10 03/28/2017 0909   BILITOT 0.34 03/28/2017 0909      Current Outpatient Medications  Medication Sig Dispense Refill  . cloNIDine (CATAPRES) 0.1 MG tablet Take 0.1 mg by mouth 2 (two) times daily.     . diclofenac sodium (VOLTAREN) 1 % GEL Apply 2 g topically 4 (four) times daily. 1 Tube 0  . diltiazem (CARDIZEM CD) 180 MG 24 hr capsule Take 180 mg by mouth daily.    Marland Kitchen escitalopram (LEXAPRO) 10 MG tablet Take 10 mg by mouth daily.  3  . Fluticasone-Salmeterol (ADVAIR) 100-50 MCG/DOSE AEPB Inhale 2 puffs into the lungs 2 (two) times daily as needed (wheezing).     . gabapentin (NEURONTIN) 300 MG capsule  Take 600 mg by mouth 3 (three) times daily.  1  . levETIRAcetam (KEPPRA XR) 500 MG 24 hr tablet Take 500 mg by mouth 2 (two) times daily.  1  . lidocaine (LIDODERM) 5 % Place 1 patch onto the skin daily. Apply at 8 am and remove at 8 pm  6  . losartan-hydrochlorothiazide (HYZAAR) 100-12.5 MG tablet Take 1 tablet by mouth daily.    . meclizine (ANTIVERT) 12.5 MG tablet Take 1 tablet (12.5 mg total) by mouth 3 (three) times daily as needed for dizziness. 30 tablet 0  . memantine (NAMENDA) 5 MG tablet Take 5 mg by mouth daily.  1  . Multiple Vitamins-Minerals (CENTRUM SILVER ADULT 50+ PO) Take 1 tablet by mouth daily.    Marland Kitchen oxyCODONE-acetaminophen (PERCOCET/ROXICET) 5-325 MG tablet Take 1 tablet by  mouth every 6 (six) hours as needed for severe pain. 15 tablet 0  . polyethylene glycol (MIRALAX / GLYCOLAX) packet Take 17 g by mouth daily as needed for mild constipation.    . potassium chloride SA (K-DUR,KLOR-CON) 20 MEQ tablet Take 20 mEq by mouth daily.      No current facility-administered medications for this visit.         Impression and Plan:  72 year old woman with:  1. Stage IV colon cancer without any evidence of recurrent disease after surgical resection and chemotherapy by concluded in 2011.   Imaging studies including CT scan of the abdomen and pelvis obtained in October 2018 showed no evidence of metastatic disease.  Her symptoms do not support any evidence to suggest relapse.  2.  Lumbar spine changes:  L3-L4 endplate changes and disc space process that was detected since 2017.  No suspicious for malignancy but MRI is recommended.  This has not been completed for unclear reasons at this time.  Serum protein electrophoresis on 03/28/2017 was within normal range.  Differential diagnosis includes benign findings and less likely multiple myeloma or other malignancy.  Her workup has been unrevealing for any malignancy at this time.  I do recommend completing the MRI for  completeness.  She reports that she has a follow-up with neurology which I encouraged her to do so as well.  We will arrange for MRI as well.   3. Leukocytopenia: CBC repeated today but her white cell count and differential unchanged from previous examination.  4.  Left flank pain: Related to postherpetic neuralgia.  I doubt malignant etiology at this time.  5. Followup: Will be in the next 6 months for routine follow-up unless her MRI is abnormal.  15  minutes was spent with the patient face-to-face today.  More than 50% of time was dedicated to patient counseling, education and coordination of her care.      Zola Button, MD 3/29/20193:13 PM

## 2017-07-01 NOTE — Telephone Encounter (Signed)
Gave avs and calendar ° °

## 2017-07-04 LAB — KAPPA/LAMBDA LIGHT CHAINS
Kappa free light chain: 55 mg/L — ABNORMAL HIGH (ref 3.3–19.4)
Kappa, lambda light chain ratio: 1.06 (ref 0.26–1.65)
Lambda free light chains: 51.9 mg/L — ABNORMAL HIGH (ref 5.7–26.3)

## 2017-07-04 LAB — CEA (IN HOUSE-CHCC): CEA (CHCC-In House): <1 ng/mL (ref 0.00–5.00)

## 2017-07-05 LAB — MULTIPLE MYELOMA PANEL, SERUM
ALBUMIN SERPL ELPH-MCNC: 3.1 g/dL (ref 2.9–4.4)
ALPHA 1: 0.2 g/dL (ref 0.0–0.4)
Albumin/Glob SerPl: 1.2 (ref 0.7–1.7)
Alpha2 Glob SerPl Elph-Mcnc: 0.5 g/dL (ref 0.4–1.0)
B-Globulin SerPl Elph-Mcnc: 0.7 g/dL (ref 0.7–1.3)
Gamma Glob SerPl Elph-Mcnc: 1.4 g/dL (ref 0.4–1.8)
Globulin, Total: 2.8 g/dL (ref 2.2–3.9)
IGA: 238 mg/dL (ref 64–422)
IGM (IMMUNOGLOBULIN M), SRM: 68 mg/dL (ref 26–217)
IgG (Immunoglobin G), Serum: 1523 mg/dL (ref 700–1600)
TOTAL PROTEIN ELP: 5.9 g/dL — AB (ref 6.0–8.5)

## 2017-07-09 ENCOUNTER — Ambulatory Visit
Admission: RE | Admit: 2017-07-09 | Discharge: 2017-07-09 | Disposition: A | Discharge status: Home / Self Care | Payer: Medicare Other | Source: Ambulatory Visit | Attending: Oncology | Admitting: Oncology

## 2017-07-09 DIAGNOSIS — Z85038 Personal history of other malignant neoplasm of large intestine: Secondary | ICD-10-CM

## 2017-07-09 DIAGNOSIS — M898X9 Other specified disorders of bone, unspecified site: Secondary | ICD-10-CM

## 2017-07-09 MED ORDER — GADOBENATE DIMEGLUMINE 529 MG/ML IV SOLN
15.0000 mL | Freq: Once | INTRAVENOUS | Status: AC | PRN
  Administered 2017-07-09: 15 mL via INTRAVENOUS

## 2017-07-11 ENCOUNTER — Encounter: Payer: Self-pay | Admitting: Oncology

## 2017-07-11 DIAGNOSIS — D472 Monoclonal gammopathy: Secondary | ICD-10-CM

## 2017-07-11 NOTE — Progress Notes (Signed)
Results of the MRI was noted today and these results were communicated with the patient's daughter [name] over the phone.  Multiple attempts to reach the patient were unsuccessful today we will continue to try the next day or so.  Based on these results, bone marrow biopsy is recommended to make sure we are not dealing with nonsecretory multiple myeloma.  Her protein studies do not indicate multiple myeloma but given the abnormal bone marrow signal, it would be important to do so.  Will arrange the study in the near future after discussion with the patient.

## 2017-07-12 ENCOUNTER — Other Ambulatory Visit: Payer: Self-pay | Admitting: Oncology

## 2017-07-12 DIAGNOSIS — D472 Monoclonal gammopathy: Secondary | ICD-10-CM

## 2017-07-18 ENCOUNTER — Other Ambulatory Visit: Payer: Self-pay | Admitting: Specialist

## 2017-07-18 DIAGNOSIS — Z1231 Encounter for screening mammogram for malignant neoplasm of breast: Secondary | ICD-10-CM

## 2017-07-27 ENCOUNTER — Telehealth: Payer: Self-pay | Admitting: Oncology

## 2017-07-27 ENCOUNTER — Telehealth: Payer: Self-pay | Admitting: Medical Oncology

## 2017-07-27 NOTE — Telephone Encounter (Signed)
Reveiwed appt with daughter and prep instructions for BM bx.

## 2017-07-27 NOTE — Telephone Encounter (Signed)
Spoke to patients daughter regarding voicemail she left earlier about date and time of upcoming appointments °

## 2017-07-28 ENCOUNTER — Ambulatory Visit (HOSPITAL_COMMUNITY): Payer: Medicare Other

## 2017-08-05 ENCOUNTER — Other Ambulatory Visit: Payer: Self-pay | Admitting: Radiology

## 2017-08-05 ENCOUNTER — Ambulatory Visit
Admission: RE | Admit: 2017-08-05 | Discharge: 2017-08-05 | Disposition: A | Discharge status: Home / Self Care | Payer: Medicare Other | Source: Ambulatory Visit | Attending: Specialist | Admitting: Specialist

## 2017-08-05 DIAGNOSIS — Z1231 Encounter for screening mammogram for malignant neoplasm of breast: Secondary | ICD-10-CM

## 2017-08-08 ENCOUNTER — Ambulatory Visit (HOSPITAL_COMMUNITY)
Admission: RE | Admit: 2017-08-08 | Discharge: 2017-08-08 | Disposition: A | Discharge status: Home / Self Care | Payer: Medicare Other | Source: Ambulatory Visit | Attending: Oncology | Admitting: Oncology

## 2017-08-08 ENCOUNTER — Encounter (HOSPITAL_COMMUNITY): Payer: Self-pay

## 2017-08-08 DIAGNOSIS — Z79899 Other long term (current) drug therapy: Secondary | ICD-10-CM | POA: Diagnosis not present

## 2017-08-08 DIAGNOSIS — I10 Essential (primary) hypertension: Secondary | ICD-10-CM | POA: Insufficient documentation

## 2017-08-08 DIAGNOSIS — D469 Myelodysplastic syndrome, unspecified: Secondary | ICD-10-CM | POA: Diagnosis not present

## 2017-08-08 DIAGNOSIS — D696 Thrombocytopenia, unspecified: Secondary | ICD-10-CM | POA: Insufficient documentation

## 2017-08-08 DIAGNOSIS — Z9221 Personal history of antineoplastic chemotherapy: Secondary | ICD-10-CM | POA: Insufficient documentation

## 2017-08-08 DIAGNOSIS — D7589 Other specified diseases of blood and blood-forming organs: Secondary | ICD-10-CM | POA: Diagnosis not present

## 2017-08-08 DIAGNOSIS — Z85038 Personal history of other malignant neoplasm of large intestine: Secondary | ICD-10-CM | POA: Diagnosis not present

## 2017-08-08 DIAGNOSIS — D472 Monoclonal gammopathy: Secondary | ICD-10-CM | POA: Diagnosis present

## 2017-08-08 DIAGNOSIS — Z8505 Personal history of malignant neoplasm of liver: Secondary | ICD-10-CM | POA: Insufficient documentation

## 2017-08-08 DIAGNOSIS — J45909 Unspecified asthma, uncomplicated: Secondary | ICD-10-CM | POA: Insufficient documentation

## 2017-08-08 LAB — CBC WITH DIFFERENTIAL/PLATELET
Basophils Absolute: 0 10*3/uL (ref 0.0–0.1)
Basophils Relative: 1 %
EOS PCT: 0 %
Eosinophils Absolute: 0 10*3/uL (ref 0.0–0.7)
HCT: 38.7 % (ref 36.0–46.0)
Hemoglobin: 12.7 g/dL (ref 12.0–15.0)
LYMPHS ABS: 0.5 10*3/uL — AB (ref 0.7–4.0)
Lymphocytes Relative: 18 %
MCH: 31.1 pg (ref 26.0–34.0)
MCHC: 32.8 g/dL (ref 30.0–36.0)
MCV: 94.6 fL (ref 78.0–100.0)
MONOS PCT: 7 %
Monocytes Absolute: 0.2 10*3/uL (ref 0.1–1.0)
Neutro Abs: 2 10*3/uL (ref 1.7–7.7)
Neutrophils Relative %: 74 %
PLATELETS: 143 10*3/uL — AB (ref 150–400)
RBC: 4.09 MIL/uL (ref 3.87–5.11)
RDW: 13.1 % (ref 11.5–15.5)
WBC: 2.7 10*3/uL — AB (ref 4.0–10.5)

## 2017-08-08 LAB — PROTIME-INR
INR: 0.95
Prothrombin Time: 12.6 seconds (ref 11.4–15.2)

## 2017-08-08 MED ORDER — FENTANYL CITRATE (PF) 100 MCG/2ML IJ SOLN
INTRAMUSCULAR | Status: AC | PRN
  Administered 2017-08-08 (×2): 25 ug via INTRAVENOUS
  Administered 2017-08-08: 50 ug via INTRAVENOUS

## 2017-08-08 MED ORDER — MIDAZOLAM HCL 2 MG/2ML IJ SOLN
INTRAMUSCULAR | Status: AC | PRN
  Administered 2017-08-08: 1 mg via INTRAVENOUS
  Administered 2017-08-08 (×2): 0.5 mg via INTRAVENOUS

## 2017-08-08 MED ORDER — FENTANYL CITRATE (PF) 100 MCG/2ML IJ SOLN
INTRAMUSCULAR | Status: AC
  Filled 2017-08-08: qty 4

## 2017-08-08 MED ORDER — SODIUM CHLORIDE 0.9 % IV SOLN
INTRAVENOUS | Status: DC
  Administered 2017-08-08: 08:00:00 via INTRAVENOUS

## 2017-08-08 MED ORDER — HYDROCODONE-ACETAMINOPHEN 5-325 MG PO TABS: 1.0000 | ORAL_TABLET | ORAL | Status: DC | PRN

## 2017-08-08 MED ORDER — LIDOCAINE HCL 1 % IJ SOLN
INTRAMUSCULAR | Status: AC | PRN
  Administered 2017-08-08: 10 mL via INTRADERMAL

## 2017-08-08 MED ORDER — MIDAZOLAM HCL 2 MG/2ML IJ SOLN
INTRAMUSCULAR | Status: AC
  Filled 2017-08-08: qty 4

## 2017-08-08 NOTE — Procedures (Signed)
  Procedure: CT bone marrow biopsy R iliac EBL:   minimal Complications:  none immediate  See full dictation in [facility].  Dillard Cannon MD Main # [phone] Pager  [phone]

## 2017-08-08 NOTE — H&P (Signed)
Referring Physician(s): Wyatt Portela  Supervising Physician: Arne Cleveland  Patient Status:  [name] OP  Chief Complaint:  "I'm having a bone marrow biopsy"  Subjective: Patient familiar to IR service from prior Port-A-Cath placement in 2011, followed by removal in 2013, left retroperitoneal lymph node biopsy in 2012 as well as bone marrow biopsy in 2012.  She has a history of metastatic colon cancer, originally diagnosed in 2011, status post colectomy and chemotherapy as well as radiofrequency ablation of a left liver metastasis in 2011.  She now presents with leukopenia, thrombocytopenia, and elevated kappa/lambda light chains as well as bone marrow abnormality on recent MRI suggesting myeloma.  She is scheduled today for CT-guided bone marrow biopsy for further evaluation.  She currently denies fever, headache, chest pain, dyspnea, cough, abdominal pain, nausea, vomiting or bleeding.  She does have occ back pain,  hot and cold flashes as well as some postherpetic neuralgia. Past Medical History:  Diagnosis Date  . Anemia   . Arthritis   . Asthma   . Clotting disorder ([facility])   . Colon cancer ([place])   . DVT (deep venous thrombosis) ([facility])    right jugular  . Essential hypertension 02/20/2016  . Hypertension   . 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 PONV (postoperative nausea and vomiting)   . Seizures ([place])   . Shingles rash   . Spinal headache    Past Surgical History:  Procedure Laterality Date  . ABDOMINAL HYSTERECTOMY  2012  . COLON SURGERY    . COLONOSCOPY    . PORTACATH PLACEMENT    . portacath removal    . RADIOFREQUENCY ABLATION LIVER TUMOR    . SMALL INTESTINE SURGERY       Allergies: Patient has no known allergies.  Medications: Prior to Admission medications   Medication Sig Start Date End Date Taking? Authorizing Provider  cloNIDine (CATAPRES) 0.1 MG tablet Take 0.1 mg by mouth 2 (two) times daily.    Yes [provider]    diclofenac sodium (VOLTAREN) 1 % GEL Apply 2 g topically 4 (four) times daily. 06/12/17  Yes Yu, Amy V, PA-C  diltiazem (CARDIZEM CD) 180 MG 24 hr capsule Take 180 mg by mouth daily. 12/06/13  Yes [provider]  escitalopram (LEXAPRO) 10 MG tablet Take 10 mg by mouth daily. 01/01/17  Yes [provider]  gabapentin (NEURONTIN) 300 MG capsule Take 600 mg by mouth 3 (three) times daily. 01/18/17  Yes [provider]  lidocaine (LIDODERM) 5 % Place 1 patch onto the skin daily. Apply at 8 am and remove at 8 pm 01/06/17  Yes [provider]  losartan-hydrochlorothiazide (HYZAAR) 100-12.5 MG tablet Take 1 tablet by mouth daily.   Yes [provider]  memantine (NAMENDA) 5 MG tablet Take 5 mg by mouth daily. 01/18/17  Yes [provider]  Multiple Vitamins-Minerals (CENTRUM SILVER ADULT 50+ PO) Take 1 tablet by mouth daily.   Yes [provider]  potassium chloride SA (K-DUR,KLOR-CON) 20 MEQ tablet Take 20 mEq by mouth daily.    Yes [provider]  TAZTIA XT 180 MG 24 hr capsule Take by mouth daily. 06/08/17  Yes [provider]  Fluticasone-Salmeterol (ADVAIR) 100-50 MCG/DOSE AEPB Inhale 2 puffs into the lungs 2 (two) times daily as needed (wheezing).     [provider]  levETIRAcetam (KEPPRA XR) 500 MG 24 hr tablet Take 500 mg by mouth 2 (two) times daily.  01/18/17   [provider]  meclizine (ANTIVERT) 12.5 MG tablet Take 1 tablet (12.5 mg total) by mouth 3 (three) times daily as needed for dizziness. 01/22/17   Domenic Moras, PA-C  oxyCODONE-acetaminophen (PERCOCET/ROXICET) 5-325 MG tablet Take 1 tablet by mouth every 6 (six) hours as needed for severe pain. 01/22/17   Domenic Moras, PA-C  polyethylene glycol [facility] / Floria Raveling) packet Take 17 g by mouth daily as needed for mild constipation.    [provider]     Vital Signs: BP (!) 175/87 (BP Location: Right Arm)   Pulse 71   Temp 99.1 F  (37.3 C) (Oral)   Resp 16   SpO2 97%   Physical Exam awake, alert.  Chest clear to auscultation bilaterally.  Heart with regular rate and rhythm.  Abdomen soft, positive bowel sounds, nontender.  No significant lower extremity edema.  Imaging: No results found.  Labs:  CBC: Recent Labs    03/26/17 0825 03/28/17 0909 06/12/17 1743 07/01/17 1454 08/08/17 0728  WBC 2.9* 2.9*  --  2.0* 2.7*  HGB 12.3 12.0 13.6 12.0 12.7  HCT 38.1 36.8 40.0 36.3 38.7  PLT 118* 106*  --  118* 143*    COAGS: Recent Labs    08/08/17 0728  INR 0.95    BMP: Recent Labs    01/22/17 1456 03/26/17 0825 03/28/17 0909 06/12/17 1743 07/01/17 1454  NA 136 141 140 138 140  K 3.8 3.9 3.7 3.8 4.2  CL 100* 110  --  100* 106  CO2  --  25 27  --  30*  GLUCOSE 95 111* 137 95 90  BUN 13 10 10.'8 11 11  ' CALCIUM  --  8.2* 8.7  --  8.8  CREATININE 1.00 0.87 0.8 0.80 0.85  GFRNONAA  --  >60  --   --  >60  GFRAA  --  >60  --   --  >60    LIVER FUNCTION TESTS: Recent Labs    03/26/17 0825 03/28/17 0909 07/01/17 1454  BILITOT 0.4 0.34 0.3  AST '26 17 17  ' ALT 11* 10 10  ALKPHOS 54 54 59  PROT 6.5 6.5  6.3 6.3*  ALBUMIN 3.1* 3.1* 3.0*    Assessment and Plan: Pt with history of metastatic colon cancer, originally diagnosed in 2011, status post colectomy and chemotherapy as well as radiofrequency ablation of a left liver metastasis in 2011.  She now presents with leukopenia, thrombocytopenia, and elevated kappa/lambda light chains as well as bone marrow abnormality on recent MRI suggesting myeloma.  She is scheduled today for CT-guided bone marrow biopsy for further evaluation.  Risks and benefits discussed with the patient/family including, but not limited to bleeding, infection, damage to adjacent structures or low yield requiring additional tests.  All of the patient's questions were answered, patient is agreeable to proceed. Consent signed and in chart.     Electronically Signed: D. Rowe Robert, PA-C 08/08/2017, 8:44 AM   I spent a total of 20 minutes at the the patient's bedside AND on the patient's hospital floor or unit, greater than 50% of which was counseling/coordinating care for CT-guided bone marrow biopsy

## 2017-08-08 NOTE — Discharge Instructions (Signed)

## 2017-08-10 ENCOUNTER — Ambulatory Visit (HOSPITAL_COMMUNITY): Payer: Medicare Other

## 2017-08-10 ENCOUNTER — Other Ambulatory Visit (HOSPITAL_COMMUNITY): Payer: Medicare Other

## 2017-08-11 ENCOUNTER — Encounter: Payer: Self-pay | Admitting: Oncology

## 2017-08-11 NOTE — Progress Notes (Signed)
The results of the bone marrow biopsy were personally reviewed and discussed with [name] the patient's daughter.  I attempted to reach Ms. Eugene today and was unsuccessful.  Bone marrow showed no evidence of dysplasia or plasma cell disorder.  Essentially normal findings and no suggestive of a myeloproliferative disorder.  The plan is to continue with active surveillance and monitoring and she will keep her follow-up appointment in September 2019.

## 2017-08-19 ENCOUNTER — Encounter (HOSPITAL_COMMUNITY): Payer: Self-pay | Admitting: Oncology

## 2017-08-19 LAB — CHROMOSOME ANALYSIS, BONE MARROW

## 2017-09-29 ENCOUNTER — Encounter: Payer: Self-pay | Admitting: Neurology

## 2017-09-29 ENCOUNTER — Ambulatory Visit (INDEPENDENT_AMBULATORY_CARE_PROVIDER_SITE_OTHER): Payer: Medicare Other | Admitting: Neurology

## 2017-09-29 ENCOUNTER — Other Ambulatory Visit: Payer: Self-pay | Admitting: *Deleted

## 2017-09-29 VITALS — BP 149/88 | HR 82 | Ht 62.0 in | Wt 168.0 lb

## 2017-09-29 DIAGNOSIS — G40309 Generalized idiopathic epilepsy and epileptic syndromes, not intractable, without status epilepticus: Secondary | ICD-10-CM

## 2017-09-29 DIAGNOSIS — G3184 Mild cognitive impairment, so stated: Secondary | ICD-10-CM

## 2017-09-29 MED ORDER — MEMANTINE HCL 10 MG PO TABS: 10.0000 mg | ORAL_TABLET | Freq: Two times a day (BID) | ORAL | 3 refills | Status: AC

## 2017-09-29 MED ORDER — MEMANTINE HCL 5 MG PO TABS: 10.0000 mg | ORAL_TABLET | Freq: Two times a day (BID) | ORAL | 11 refills | Status: DC

## 2017-09-29 MED ORDER — LEVETIRACETAM 500 MG PO TABS: 500.0000 mg | ORAL_TABLET | Freq: Two times a day (BID) | ORAL | 3 refills | Status: DC

## 2017-09-29 MED ORDER — LEVETIRACETAM ER 500 MG PO TB24: 500.0000 mg | ORAL_TABLET | Freq: Two times a day (BID) | ORAL | 4 refills | Status: DC

## 2017-09-29 NOTE — Progress Notes (Signed)
[facility]  PATIENT: [name] DOB: 30-Jun-1945   REASON FOR VISIT: Follow-up for seizure disorder HISTORY FROM: Patient    HISTORY OF PRESENT ILLNESSMs.Abdelrahman, is a 72 years old right-handed female, follow-up for seizure,     She has past medical history of hypertension, colon cancer in 2011, status post surgery, chemotherapy, she also underwent hysterectomy in February 2012 for uterus prolapse, she presented with seizure on May 27, 2010.  She was discharged from nursing home after her hysterectomy, she was at nursing home due to the needs for prolonged antibiotic infusion,she is not sure the name of the medications.  on May 27, 2010 while at nursing home, walking in her room in the early afternoon, without warning signs, she had one seizure, was taken to the emergency room. She had no recollection of what has happened, wake up in the emergency room confused. CAT scan without contrast of brain was normal. Laboratory evaluation has demonstrate decreased white count 2.6, anemia hemoglobin 9.3, hematocrit 29.7, UA showed UTI, moderate esterase, she apparently was started on Keppra 250 mg b.i.d. 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 EEG normal.   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, no gait difficulty, lives alone, still driving,  one nocturnal seizure on May 04 2015 during sleep, witnessed by her daughter, tonic-clonic seizure for a few minutes followed by post event confusion, with tongue biting, was taken to the  emergency room, evidence of UTI, low albumin of 2.7, mild anemia 11, she is now taking Keppra 500 mg twice a day, tolerating it well   MRI of the brain with without contrast in March 2017: moderate changes of chronic microvascular ischemia and mild degree of generalized cerebral atrophy. No enhancing lesions are noted. Compared with previous MRI scan dated 09/21/2013 the chronic microvascular ischemic changes appear to be more advanced  CT abdomen chest, pelvic with contrast in March 2017:1. Stable examination. No findings to suggest recurrent tumor or metastatic disease. Decrease in size of treated lesion within left lobe of liver. No new metastatic lesions identified. EEG was normal in March 2017  She also reported a history of childhood generalized seizure from age 31 to age 70, but she was never treated with any antiepileptic medications.  UPDATE September 29 2017: She is accompanied by her daughter at today's clinical visit, she was noted to have mild memory loss over the past couple years, independent living, status examination is 16 out of 30 today,   No recurrent seizure, tolerating Keppra 500 mg twice daily, She has developed postherpetic neuralgia, using compounding cream, also pain management, complains of daytime sleepiness,   REVIEW OF SYSTEMS: Full 14 system review of systems performed and notable only for those listed, all others are neg:  Hearing loss, abdominal pain, memory loss, dizziness, depression, anxiety, back pain, frequent awakening, daytime sleepiness, snoring  ALLERGIES: No Known Allergies  HOME MEDICATIONS: Outpatient Medications Prior to Visit  Medication Sig Dispense Refill  . cloNIDine (CATAPRES) 0.1 MG tablet Take 0.1 mg by mouth 2 (two) times daily.     . diclofenac sodium (VOLTAREN) 1 % GEL Apply 2  g topically 4 (four) times daily. 1 Tube 0  . escitalopram (LEXAPRO) 10 MG tablet Take 10 mg by mouth daily.  3  . Fluticasone-Salmeterol (ADVAIR) 100-50 MCG/DOSE  AEPB Inhale 2 puffs into the lungs 2 (two) times daily as needed (wheezing).     . gabapentin (NEURONTIN) 300 MG capsule Take 600 mg by mouth 3 (three) times daily.  1  . levETIRAcetam (KEPPRA XR) 500 MG 24 hr tablet Take 500 mg by mouth 2 (two) times daily.  1  . lidocaine (LIDODERM) 5 % Place 1 patch onto the skin daily. Apply at 8 am and remove at 8 pm  6  . losartan-hydrochlorothiazide (HYZAAR) 100-12.5 MG tablet Take 1 tablet by mouth daily.    . meclizine (ANTIVERT) 12.5 MG tablet Take 1 tablet (12.5 mg total) by mouth 3 (three) times daily as needed for dizziness. 30 tablet 0  . memantine (NAMENDA) 5 MG tablet Take 5 mg by mouth daily.  1  . Multiple Vitamins-Minerals (CENTRUM SILVER ADULT 50+ PO) Take 1 tablet by mouth daily.    Gean Birchwood ER 50 MG 12 hr tablet Limit 1 tablet by mouth every 12 hours tolerated.NO HYDROCODONE/ACETAMINOPHEN  0  . polyethylene glycol (MIRALAX / GLYCOLAX) packet Take 17 g by mouth daily as needed for mild constipation.    . potassium chloride SA (K-DUR,KLOR-CON) 20 MEQ tablet Take 20 mEq by mouth daily.     Marland Kitchen TAZTIA XT 180 MG 24 hr capsule Take by mouth daily.  5  . diltiazem (CARDIZEM CD) 180 MG 24 hr capsule Take 180 mg by mouth daily.    Marland Kitchen oxyCODONE-acetaminophen (PERCOCET/ROXICET) 5-325 MG tablet Take 1 tablet by mouth every 6 (six) hours as needed for severe pain. 15 tablet 0   No facility-administered medications prior to visit.     PAST MEDICAL HISTORY: Past Medical History:  Diagnosis Date  . Anemia   . Arthritis   . Asthma   . Clotting disorder ([place])   . Colon cancer ([place])   . DVT (deep venous thrombosis) ([facility])    right jugular  . Essential hypertension 02/20/2016  . Hypertension   . 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 PONV (postoperative nausea and vomiting)   . Seizures ([place])   . Shingles rash   . Spinal headache     PAST SURGICAL HISTORY: Past Surgical History:  Procedure Laterality Date  . ABDOMINAL  HYSTERECTOMY  2012  . COLON SURGERY    . COLONOSCOPY    . PORTACATH PLACEMENT    . portacath removal    . RADIOFREQUENCY ABLATION LIVER TUMOR    . SMALL INTESTINE SURGERY      FAMILY HISTORY: Family History  Problem Relation Age of Onset  . Hypertension Father   . Cancer Brother        colon  . Cancer Daughter        colon  . Breast cancer Sister     SOCIAL HISTORY: Social History   Socioeconomic History  . Marital status: Divorced    Spouse name: Not on file  . Number of children: 3  . Years of education: 12+  . Highest education level: Not on file  Occupational History  . Occupation: Retired  Scientific laboratory technician  . Financial resource strain: Not on file  . Food insecurity:    Worry: Not on file    Inability: Not on file  . Transportation needs:    Medical: Not on  file    Non-medical: Not on file  Tobacco Use  . Smoking status: Never Smoker  . Smokeless tobacco: Never Used  Substance and Sexual Activity  . Alcohol use: No  . Drug use: No  . Sexual activity: Never  Lifestyle  . Physical activity:    Days per week: Not on file    Minutes per session: Not on file  . Stress: Not on file  Relationships  . Social connections:    Talks on phone: Not on file    Gets together: Not on file    Attends religious service: Not on file    Active member of club or organization: Not on file    Attends meetings of clubs or organizations: Not on file    Relationship status: Not on file  . Intimate partner violence:    Fear of current or ex partner: Not on file    Emotionally abused: Not on file    Physically abused: Not on file    Forced sexual activity: Not on file  Other Topics Concern  . Not on file  Social History Narrative   Patient lives at home alone.    Patient is divorced.    Patient has 3 children.    Patient has some college.    Patient is retired.      PHYSICAL EXAM  Vitals:   09/29/17 1117  BP: (!) 149/88  Pulse: 82  Weight: 168 lb (76.2 kg)    Height: '5\' 2"'  (1.575 m)   Body mass index is 30.73 kg/m.  Generalized: Well developed, in no acute distress  Head: normocephalic and atraumatic,. Oropharynx benign  Neck: Supple, no carotid bruits  Cardiac: Regular rate rhythm, no murmur  Musculoskeletal: No deformity   Neurological examination  MMSE - Mini Mental State Exam 09/29/2017  Orientation to time 5  Orientation to Place 5  Registration 3  Attention/ Calculation 5  Recall 1  Language- name 2 objects 2  Language- repeat 1  Language- follow 3 step command 3  Language- read & follow direction 1  Write a sentence 1  Copy design 0  Total score 27    Cranial nerve II-XII: Pupils were equal round reactive to light extraocular movements were full, visual field were full on confrontational test. Facial sensation and strength were normal. hearing was intact to finger rubbing bilaterally. Uvula tongue midline. head turning and shoulder shrug were normal and symmetric.Tongue protrusion into cheek strength was normal. Motor: normal bulk and tone, full strength in the BUE, BLE, fine finger movements normal, no pronator drift. No focal weakness Sensory: normal and symmetric to light touch, in the upper and lower extremities Coordination: finger-nose-finger, heel-to-shin bilaterally, no dysmetria Reflexes: Symmetric upper and lower, plantar responses were flexor bilaterally. Gait and Station: cautious, mild antalgic DIAGNOSTIC DATA (LABS, IMAGING, TESTING) - I reviewed patient records, labs, notes, testing and imaging myself where available.  Lab Results  Component Value Date   WBC 2.7 (L) 08/08/2017   HGB 12.7 08/08/2017   HCT 38.7 08/08/2017   MCV 94.6 08/08/2017   PLT 143 (L) 08/08/2017      Component Value Date/Time   NA 140 07/01/2017 1454   NA 140 03/28/2017 0909   K 4.2 07/01/2017 1454   K 3.7 03/28/2017 0909   CL 106 07/01/2017 1454   CL 106 06/19/2012 0936   CO2 30 (H) 07/01/2017 1454   CO2 27 03/28/2017 0909    GLUCOSE 90 07/01/2017 1454   GLUCOSE 137 03/28/2017  7841   GLUCOSE 93 06/19/2012 0936   BUN 11 07/01/2017 1454   BUN 10.8 03/28/2017 0909   CREATININE 0.85 07/01/2017 1454   CREATININE 0.8 03/28/2017 0909   CALCIUM 8.8 07/01/2017 1454   CALCIUM 8.7 03/28/2017 0909   PROT 6.3 (L) 07/01/2017 1454   PROT 6.5 03/28/2017 0909   PROT 6.3 03/28/2017 0909   ALBUMIN 3.0 (L) 07/01/2017 1454   ALBUMIN 3.1 (L) 03/28/2017 0909   AST 17 07/01/2017 1454   AST 17 03/28/2017 0909   ALT 10 07/01/2017 1454   ALT 10 03/28/2017 0909   ALKPHOS 59 07/01/2017 1454   ALKPHOS 54 03/28/2017 0909   BILITOT 0.3 07/01/2017 1454   BILITOT 0.34 03/28/2017 0909   GFRNONAA >60 07/01/2017 1454   GFRAA >60 07/01/2017 1454    ASSESSMENT AND PLAN  72 y.o. year old female   History of seizure  Last seizure was in January 2017, nocturnal generalized seizure  Doing well on Keppra 500 mg twice daily  Mild cognitive impairment  Check TSH, B12  Increase Namenda to 10 mg twice a day   Marcial Pacas, M.D. Ph.D.  [facility] [place], [place] [address] Phone: [phone] Fax:      [phone]

## 2017-09-30 ENCOUNTER — Telehealth: Payer: Self-pay | Admitting: *Deleted

## 2017-09-30 ENCOUNTER — Encounter: Payer: Self-pay | Admitting: *Deleted

## 2017-09-30 LAB — VITAMIN B12: VITAMIN B 12: 574 pg/mL (ref 232–1245)

## 2017-09-30 LAB — TSH: TSH: 1.28 u[IU]/mL (ref 0.450–4.500)

## 2017-09-30 NOTE — Telephone Encounter (Signed)
-----   Message from Marcial Pacas, MD sent at 09/30/2017  9:42 AM EDT ----- Please call patient for normal laboratory result

## 2017-09-30 NOTE — Telephone Encounter (Signed)
Unable to reach patient after two attempts today.  She does not have a voicemail set up.  There is not a DPR scanned to her chart yet so I am unable to contact family members.  I have mailed a letter to notify her of the normal lab results.

## 2017-12-28 ENCOUNTER — Telehealth: Payer: Self-pay | Admitting: Oncology

## 2017-12-28 ENCOUNTER — Inpatient Hospital Stay: Payer: Medicare Other | Attending: Oncology | Admitting: Oncology

## 2017-12-28 VITALS — BP 139/70 | HR 82 | Temp 98.9°F | Resp 18 | Ht 62.0 in | Wt 167.7 lb

## 2017-12-28 DIAGNOSIS — D72819 Decreased white blood cell count, unspecified: Secondary | ICD-10-CM | POA: Diagnosis not present

## 2017-12-28 DIAGNOSIS — Z8505 Personal history of malignant neoplasm of liver: Secondary | ICD-10-CM | POA: Diagnosis not present

## 2017-12-28 DIAGNOSIS — R937 Abnormal findings on diagnostic imaging of other parts of musculoskeletal system: Secondary | ICD-10-CM | POA: Diagnosis not present

## 2017-12-28 DIAGNOSIS — Z85038 Personal history of other malignant neoplasm of large intestine: Secondary | ICD-10-CM

## 2017-12-28 DIAGNOSIS — D696 Thrombocytopenia, unspecified: Secondary | ICD-10-CM

## 2017-12-28 DIAGNOSIS — Z79899 Other long term (current) drug therapy: Secondary | ICD-10-CM | POA: Diagnosis not present

## 2017-12-28 DIAGNOSIS — Z9049 Acquired absence of other specified parts of digestive tract: Secondary | ICD-10-CM

## 2017-12-28 DIAGNOSIS — Z9221 Personal history of antineoplastic chemotherapy: Secondary | ICD-10-CM | POA: Diagnosis not present

## 2017-12-28 DIAGNOSIS — B0229 Other postherpetic nervous system involvement: Secondary | ICD-10-CM | POA: Diagnosis not present

## 2017-12-28 NOTE — Progress Notes (Signed)
Hematology and Oncology Follow Up Visit  [name] [id number] 1946/02/18 72 y.o. 12/28/2017 12:31 PM   Principle Diagnosis: 72 year old woman with:  1.  Colon cancer diagnosed in 2011.  She presented with stage IV disease and isolated metastasis to the liver.  She is status post resection without any evidence of recurrence.  2.  Lumbar spine changes noted incidentally on a CT scan in October 2018.  Workup revealed no abnormalities in her bone marrow.  Prior Therapy:  She is status post colectomy as well as radiofrequency ablation to an isolated liver met on 05/05/2009 at [facility].  She is status post chemotherapy with FOLFOX 6 and Avastin from 06/08/2009 through 12/19/2009 requiring dose reduction for thrombocytopenia and leukopenia. CT scan findings were retroperitoneal and anterior adenopathy status post CT guided core biopsy on 03/15/2011 showed no evidence  of malignancy except for fibrosis. 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 She status post bone marrow biopsy completed May 2019.  No disease detected at the time.  Current therapy: Active surveillance.  Interim History: [name] is here for a follow-up visit.  Since last visit, she reports no major changes in her health.  She continues to have issues related to postherpetic neuralgia which is unchanged at this time.  Her appetite remains reasonable and her mobility is unchanged.  She denies any weight loss or appetite changes.  She denies any worsening back pain.  She denies any changes in her bowel habits.   She does not report any headaches or blurry vision or syncope.  She denies any alteration in mental status or confusion.  She does not report any fevers, chills or sweats.  She has not reported any chest pain or palpitations. She does not report any cough, wheezing or hemoptysis. She does not report any bone pain or pathological fractures. She is not on any  lymphadenopathy or petechiae. She does not report any frequency urgency or hesitancy.  Denies any changes in her mood.  Rest of her review of systems is negative.   Medications: I have reviewed the patient's current medications. Current Outpatient Medications  Medication Sig Dispense Refill  . cloNIDine (CATAPRES) 0.1 MG tablet Take 0.1 mg by mouth 2 (two) times daily.     . diclofenac sodium (VOLTAREN) 1 % GEL Apply 2 g topically 4 (four) times daily. 1 Tube 0  . escitalopram (LEXAPRO) 10 MG tablet Take 10 mg by mouth daily.  3  . Fluticasone-Salmeterol (ADVAIR) 100-50 MCG/DOSE AEPB Inhale 2 puffs into the lungs 2 (two) times daily as needed (wheezing).     . gabapentin (NEURONTIN) 300 MG capsule Take 600 mg by mouth 3 (three) times daily.  1  . levETIRAcetam (KEPPRA) 500 MG tablet Take 1 tablet (500 mg total) by mouth 2 (two) times daily. 180 tablet 3  . lidocaine (LIDODERM) 5 % Place 1 patch onto the skin daily. Apply at 8 am and remove at 8 pm  6  . losartan-hydrochlorothiazide (HYZAAR) 100-12.5 MG tablet Take 1 tablet by mouth daily.    . meclizine (ANTIVERT) 12.5 MG tablet Take 1 tablet (12.5 mg total) by mouth 3 (three) times daily as needed for dizziness. 30 tablet 0  . memantine (NAMENDA) 10 MG tablet Take 1 tablet (10 mg total) by mouth 2 (two) times daily. 180 tablet 3  . Multiple Vitamins-Minerals (CENTRUM SILVER ADULT 50+ PO) Take 1 tablet by mouth daily.    Gean Birchwood ER 50 MG 12  hr tablet Limit 1 tablet by mouth every 12 hours tolerated.NO HYDROCODONE/ACETAMINOPHEN  0  . polyethylene glycol (MIRALAX / GLYCOLAX) packet Take 17 g by mouth daily as needed for mild constipation.    . potassium chloride SA (K-DUR,KLOR-CON) 20 MEQ tablet Take 20 mEq by mouth daily.     Marland Kitchen TAZTIA XT 180 MG 24 hr capsule Take by mouth daily.  5   No current facility-administered medications for this visit.     Allergies: No Known Allergies  Past Medical History, Surgical history, Social history, and  Family History were reviewed and updated.   Physical Exam: Blood pressure 139/70, pulse 82, temperature 98.9 F (37.2 C), temperature source Oral, resp. rate 18, height '5\' 2"'  (1.575 m), weight 167 lb 11.2 oz (76.1 kg), SpO2 99 %. ECOG: 1  General appearance: Comfortable appearing without any discomfort Head: Normocephalic without any trauma Oropharynx: Mucous membranes are moist and pink without any thrush or ulcers. Eyes: Pupils are equal and round reactive to light. Lymph nodes: No cervical, supraclavicular, inguinal or axillary lymphadenopathy.   Heart:regular rate and rhythm.  S1 and S2 without leg edema. Lung: Clear without any rhonchi or wheezes.  No dullness to percussion. Abdomin: Soft, nontender, nondistended with good bowel sounds.  No hepatosplenomegaly. Musculoskeletal: No joint deformity or effusion.  Full range of motion noted. Neurological: No deficits noted on motor, sensory and deep tendon reflex exam. Skin: No petechial rash or dryness.  Appeared moist.   Lab Results: Lab Results  Component Value Date   WBC 2.7 (L) 08/08/2017   HGB 12.7 08/08/2017   HCT 38.7 08/08/2017   MCV 94.6 08/08/2017   PLT 143 (L) 08/08/2017     Chemistry      Component Value Date/Time   NA 140 07/01/2017 1454   NA 140 03/28/2017 0909   K 4.2 07/01/2017 1454   K 3.7 03/28/2017 0909   CL 106 07/01/2017 1454   CL 106 06/19/2012 0936   CO2 30 (H) 07/01/2017 1454   CO2 27 03/28/2017 0909   BUN 11 07/01/2017 1454   BUN 10.8 03/28/2017 0909   CREATININE 0.85 07/01/2017 1454   CREATININE 0.8 03/28/2017 0909      Component Value Date/Time   CALCIUM 8.8 07/01/2017 1454   CALCIUM 8.7 03/28/2017 0909   ALKPHOS 59 07/01/2017 1454   ALKPHOS 54 03/28/2017 0909   AST 17 07/01/2017 1454   AST 17 03/28/2017 0909   ALT 10 07/01/2017 1454   ALT 10 03/28/2017 0909   BILITOT 0.3 07/01/2017 1454   BILITOT 0.34 03/28/2017 0909      Current Outpatient Medications  Medication Sig Dispense  Refill  . cloNIDine (CATAPRES) 0.1 MG tablet Take 0.1 mg by mouth 2 (two) times daily.     . diclofenac sodium (VOLTAREN) 1 % GEL Apply 2 g topically 4 (four) times daily. 1 Tube 0  . escitalopram (LEXAPRO) 10 MG tablet Take 10 mg by mouth daily.  3  . Fluticasone-Salmeterol (ADVAIR) 100-50 MCG/DOSE AEPB Inhale 2 puffs into the lungs 2 (two) times daily as needed (wheezing).     . gabapentin (NEURONTIN) 300 MG capsule Take 600 mg by mouth 3 (three) times daily.  1  . levETIRAcetam (KEPPRA) 500 MG tablet Take 1 tablet (500 mg total) by mouth 2 (two) times daily. 180 tablet 3  . lidocaine (LIDODERM) 5 % Place 1 patch onto the skin daily. Apply at 8 am and remove at 8 pm  6  . losartan-hydrochlorothiazide (HYZAAR) 100-12.5  MG tablet Take 1 tablet by mouth daily.    . meclizine (ANTIVERT) 12.5 MG tablet Take 1 tablet (12.5 mg total) by mouth 3 (three) times daily as needed for dizziness. 30 tablet 0  . memantine (NAMENDA) 10 MG tablet Take 1 tablet (10 mg total) by mouth 2 (two) times daily. 180 tablet 3  . Multiple Vitamins-Minerals (CENTRUM SILVER ADULT 50+ PO) Take 1 tablet by mouth daily.    Gean Birchwood ER 50 MG 12 hr tablet Limit 1 tablet by mouth every 12 hours tolerated.NO HYDROCODONE/ACETAMINOPHEN  0  . polyethylene glycol (MIRALAX / GLYCOLAX) packet Take 17 g by mouth daily as needed for mild constipation.    . potassium chloride SA (K-DUR,KLOR-CON) 20 MEQ tablet Take 20 mEq by mouth daily.     Marland Kitchen TAZTIA XT 180 MG 24 hr capsule Take by mouth daily.  5   No current facility-administered medications for this visit.         Impression and Plan:  72 year old woman with:  1.  Colon cancer diagnosed in 2011.  She was found to have stage IV disease with hepatic metastasis.  After surgical resection, continues to be disease-free at this time.  Imaging studies in the past have ruled out any disease recurrence.  The natural course of this disease was reviewed today and her risk of relapse is  very low.  I recommended continued periodic monitoring repeat imaging studies as needed.   2.  Lumbar spine changes:  L3-L4 endplate changes and disc space process that was detected since 2017.   Bone marrow biopsy obtained in May 2019 rolled out any marrow changes.  These findings appear to be of a nonmalignant etiology.  I recommended continue monitoring her counts periodically.   3. Leukocytopenia: Her white cell count remained stable at this time.  4.  Postherpetic neuralgia: Related to previous zoster infection.  Appears to be stable at this time.  5. Followup: Will be in the next 6 months for repeat laboratory testing.  15  minutes was spent with the patient face-to-face today.  More than 50% of time was dedicated to reviewing the natural course of this disease, treatment options and coordinating her plan of care.      Zola Button, MD 9/25/201912:31 PM

## 2017-12-28 NOTE — Telephone Encounter (Signed)
Scheduled apt per 9/25 los - gave patient AVS and calender per los.

## 2018-01-27 ENCOUNTER — Encounter (HOSPITAL_COMMUNITY): Payer: Self-pay

## 2018-01-27 ENCOUNTER — Other Ambulatory Visit: Payer: Self-pay

## 2018-01-27 ENCOUNTER — Ambulatory Visit (HOSPITAL_COMMUNITY)
Admission: EM | Admit: 2018-01-27 | Discharge: 2018-01-27 | Disposition: A | Discharge status: Home / Self Care | Payer: Medicare Other | Attending: Physician Assistant | Admitting: Physician Assistant

## 2018-01-27 DIAGNOSIS — K0889 Other specified disorders of teeth and supporting structures: Secondary | ICD-10-CM

## 2018-01-27 MED ORDER — AMOXICILLIN 500 MG PO CAPS: 500.0000 mg | ORAL_CAPSULE | Freq: Three times a day (TID) | ORAL | 0 refills | Status: DC

## 2018-01-27 NOTE — Discharge Instructions (Signed)
See the Dentist as scheduled

## 2018-01-27 NOTE — ED Triage Notes (Signed)
Pt c/o she has toothache  X 2 days.

## 2018-01-27 NOTE — ED Provider Notes (Signed)
[facility]    CSN: [id number] Arrival date & time: 01/27/18  1718     History   Chief Complaint Chief Complaint  Patient presents with  . Dental Pain    HPI [name] is a 72 y.o. female.   The history is provided by the patient. No language interpreter was used.  Dental Pain  Location:  Upper and lower Quality:  Aching Severity:  Moderate Onset quality:  Gradual Timing:  Constant Progression:  Worsening Chronicity:  Recurrent Worsened by:  Nothing  Pt reports she is suppose to see her dentist next week.  Pt complains of pain in her upper and lower mouth.  Past Medical History:  Diagnosis Date  . Anemia   . Arthritis   . Asthma   . Clotting disorder ([place])   . Colon cancer ([place])   . DVT (deep venous thrombosis) ([facility])    right jugular  . Essential hypertension 02/20/2016  . Hypertension   . 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 PONV (postoperative nausea and vomiting)   . Seizures ([place])   . Shingles rash   . Spinal headache     Patient Active Problem List   Diagnosis Date Noted  . Mild cognitive impairment 09/29/2017  . Essential hypertension 02/20/2016  . Small bowel obstruction ([place]) 02/13/2016  . Seizures ([place]) 05/08/2015  . 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 Malignant neoplasm of colon ([place]) 02/07/2011  . Breast mass seen on mammogram 02/02/2011  . History of colon cancer, stage IV 02/02/2011    Past Surgical History:  Procedure Laterality Date  . ABDOMINAL HYSTERECTOMY  2012  . COLON SURGERY    . COLONOSCOPY    . PORTACATH PLACEMENT    . portacath removal    . RADIOFREQUENCY ABLATION LIVER TUMOR    . SMALL INTESTINE SURGERY      OB History   None      Home Medications    Prior to Admission medications   Medication Sig Start Date End Date Taking? Authorizing Provider  amoxicillin (AMOXIL) 500 MG capsule Take 1 capsule (500 mg  total) by mouth 3 (three) times daily. 01/27/18   Fransico Meadow, PA-C  cloNIDine (CATAPRES) 0.1 MG tablet Take 0.1 mg by mouth 2 (two) times daily.     [provider]  diclofenac sodium (VOLTAREN) 1 % GEL Apply 2 g topically 4 (four) times daily. 06/12/17   Tasia Catchings, Amy V, PA-C  escitalopram (LEXAPRO) 10 MG tablet Take 10 mg by mouth daily. 01/01/17   [provider]  Fluticasone-Salmeterol (ADVAIR) 100-50 MCG/DOSE AEPB Inhale 2 puffs into the lungs 2 (two) times daily as needed (wheezing).     [provider]  gabapentin (NEURONTIN) 300 MG capsule Take 600 mg by mouth 3 (three) times daily. 01/18/17   [provider]  levETIRAcetam (KEPPRA) 500 MG tablet Take 1 tablet (500 mg total) by mouth 2 (two) times daily. 09/29/17   Marcial Pacas, MD  lidocaine (LIDODERM) 5 % Place 1 patch onto the skin daily. Apply at 8 am and remove at 8 pm 01/06/17   [provider]  losartan-hydrochlorothiazide (HYZAAR) 100-12.5 MG tablet Take 1 tablet by mouth daily.    [provider]  meclizine (ANTIVERT) 12.5 MG tablet Take 1 tablet (12.5 mg total) by mouth 3 (three) times daily as needed for dizziness. 01/22/17   Domenic Moras, PA-C  memantine [facility])  10 MG tablet Take 1 tablet (10 mg total) by mouth 2 (two) times daily. 09/29/17   Marcial Pacas, MD  Multiple Vitamins-Minerals (CENTRUM SILVER ADULT 50+ PO) Take 1 tablet by mouth daily.    [provider]  NUCYNTA ER 50 MG 12 hr tablet Limit 1 tablet by mouth every 12 hours tolerated.NO HYDROCODONE/ACETAMINOPHEN 09/20/17   [provider]  polyethylene glycol (MIRALAX / GLYCOLAX) packet Take 17 g by mouth daily as needed for mild constipation.    [provider]  potassium chloride SA (K-DUR,KLOR-CON) 20 MEQ tablet Take 20 mEq by mouth daily.     [provider]  TAZTIA XT 180 MG 24 hr capsule Take by mouth daily. 06/08/17   [provider]    Family History Family History  Problem  Relation Age of Onset  . Hypertension Father   . Cancer Brother        colon  . Cancer Daughter        colon  . Breast cancer Sister     Social History Social History   Tobacco Use  . Smoking status: Never Smoker  . Smokeless tobacco: Never Used  Substance Use Topics  . Alcohol use: No  . Drug use: No     Allergies   Patient has no known allergies.   Review of Systems Review of Systems  All other systems reviewed and are negative.    Physical Exam Triage Vital Signs ED Triage Vitals  Enc Vitals Group     BP 01/27/18 1736 (!) 147/79     Pulse Rate 01/27/18 1736 83     Resp 01/27/18 1736 18     Temp 01/27/18 1736 98.2 F (36.8 C)     Temp Source 01/27/18 1736 Oral     SpO2 01/27/18 1736 99 %     Weight --      Height --      Head Circumference --      Peak Flow --      Pain Score 01/27/18 1737 2     Pain Loc --      Pain Edu? --      Excl. in [place]? --    No data found.  Updated Vital Signs BP (!) 147/79 (BP Location: Right Arm)   Pulse 83   Temp 98.2 F (36.8 C) (Oral)   Resp 18   SpO2 99%   Visual Acuity Right Eye Distance:   Left Eye Distance:   Bilateral Distance:    Right Eye Near:   Left Eye Near:    Bilateral Near:     Physical Exam  Constitutional: She appears well-developed and well-nourished.  HENT:  Mouth/Throat: Oropharynx is clear and moist.  Swelling upper and lower gum,  Multiple cavities   Eyes: Pupils are equal, round, and reactive to light.  Cardiovascular: Normal rate.  Pulmonary/Chest: Effort normal.  Musculoskeletal: Normal range of motion.  Neurological: She is alert.  Skin: Skin is warm.  Psychiatric: She has a normal mood and affect.  Nursing note and vitals reviewed.    UC Treatments / Results  Labs (all labs ordered are listed, but only abnormal results are displayed) Labs Reviewed - No data to display  EKG None  Radiology No results found.  Procedures Procedures (including critical care  time)  Medications Ordered in UC Medications - No data to display  Initial Impression / Assessment and Plan / UC Course  I have reviewed the triage vital signs and the nursing  notes.  Pertinent labs & imaging results that were available during my care of the patient were reviewed by me and considered in my medical decision making (see chart for details).      Final Clinical Impressions(s) / UC Diagnoses   Final diagnoses:  Pain, dental     Discharge Instructions     See the Dentist as scheduled   ED Prescriptions    Medication Sig Dispense Auth. Provider   amoxicillin (AMOXIL) 500 MG capsule Take 1 capsule (500 mg total) by mouth 3 (three) times daily. 30 capsule Fransico Meadow, [place]     Controlled Substance Prescriptions [place] Controlled Substance Registry consulted? Not Applicable  An After Visit Summary was printed and given to the patient.    Fransico Meadow, [place] 01/27/18 2110

## 2018-04-04 ENCOUNTER — Encounter (HOSPITAL_BASED_OUTPATIENT_CLINIC_OR_DEPARTMENT_OTHER): Payer: Self-pay

## 2018-04-04 ENCOUNTER — Ambulatory Visit (HOSPITAL_BASED_OUTPATIENT_CLINIC_OR_DEPARTMENT_OTHER): Admit: 2018-04-04 | Payer: Medicare Other | Admitting: Oral Surgery

## 2018-04-04 SURGERY — DENTAL RESTORATION/EXTRACTIONS
Anesthesia: General | Laterality: Bilateral

## 2018-05-09 ENCOUNTER — Telehealth: Payer: Self-pay

## 2018-05-09 NOTE — Telephone Encounter (Signed)
Received call from Dr. Garnet Koyanagi with [facility] stating that the patient is now part of their care program and they cover all cost of patient medical care. Appointment date and time provided and the aspecific labs to be drawn for appointment were communicated. Dr. Garnet Koyanagi stated that [facility] staff will draw the labs prior to the patient appointment with Dr. Alen Blew and the results will be faxed prior to the appointment for review.

## 2018-06-29 ENCOUNTER — Ambulatory Visit: Payer: Medicare Other | Admitting: Oncology

## 2018-06-29 ENCOUNTER — Other Ambulatory Visit: Payer: Medicare Other

## 2018-09-27 ENCOUNTER — Telehealth: Payer: Self-pay | Admitting: Oncology

## 2018-09-27 NOTE — Telephone Encounter (Signed)
Called pt per 6/24 sch message( request to reschedule 6/26 appt.)  - no answer and unable to leave message.

## 2018-09-28 ENCOUNTER — Telehealth: Payer: Self-pay

## 2018-09-28 NOTE — Telephone Encounter (Signed)
Called patient to go over pre-screening questions for upcoming appt. Could not leave message.

## 2018-09-29 ENCOUNTER — Inpatient Hospital Stay (HOSPITAL_BASED_OUTPATIENT_CLINIC_OR_DEPARTMENT_OTHER): Payer: Medicare (Managed Care) | Admitting: Oncology

## 2018-09-29 ENCOUNTER — Other Ambulatory Visit: Payer: Self-pay

## 2018-09-29 ENCOUNTER — Inpatient Hospital Stay: Payer: Medicare (Managed Care) | Attending: Oncology

## 2018-09-29 ENCOUNTER — Telehealth: Payer: Self-pay | Admitting: Oncology

## 2018-09-29 VITALS — BP 142/70 | HR 76 | Temp 98.2°F | Resp 17 | Wt 166.3 lb

## 2018-09-29 DIAGNOSIS — D72819 Decreased white blood cell count, unspecified: Secondary | ICD-10-CM | POA: Insufficient documentation

## 2018-09-29 DIAGNOSIS — Z79899 Other long term (current) drug therapy: Secondary | ICD-10-CM

## 2018-09-29 DIAGNOSIS — G8929 Other chronic pain: Secondary | ICD-10-CM | POA: Insufficient documentation

## 2018-09-29 DIAGNOSIS — Z85038 Personal history of other malignant neoplasm of large intestine: Secondary | ICD-10-CM | POA: Diagnosis not present

## 2018-09-29 DIAGNOSIS — B0229 Other postherpetic nervous system involvement: Secondary | ICD-10-CM

## 2018-09-29 DIAGNOSIS — Z9049 Acquired absence of other specified parts of digestive tract: Secondary | ICD-10-CM

## 2018-09-29 DIAGNOSIS — M25569 Pain in unspecified knee: Secondary | ICD-10-CM

## 2018-09-29 DIAGNOSIS — M25519 Pain in unspecified shoulder: Secondary | ICD-10-CM

## 2018-09-29 DIAGNOSIS — Z7951 Long term (current) use of inhaled steroids: Secondary | ICD-10-CM | POA: Insufficient documentation

## 2018-09-29 DIAGNOSIS — Z9221 Personal history of antineoplastic chemotherapy: Secondary | ICD-10-CM

## 2018-09-29 DIAGNOSIS — Z8505 Personal history of malignant neoplasm of liver: Secondary | ICD-10-CM | POA: Insufficient documentation

## 2018-09-29 LAB — CBC WITH DIFFERENTIAL (CANCER CENTER ONLY)
Abs Immature Granulocytes: 0.01 10*3/uL (ref 0.00–0.07)
Basophils Absolute: 0 10*3/uL (ref 0.0–0.1)
Basophils Relative: 1 %
Eosinophils Absolute: 0 10*3/uL (ref 0.0–0.5)
Eosinophils Relative: 0 %
HCT: 39.3 % (ref 36.0–46.0)
Hemoglobin: 12.7 g/dL (ref 12.0–15.0)
Immature Granulocytes: 0 %
Lymphocytes Relative: 38 %
Lymphs Abs: 1.2 10*3/uL (ref 0.7–4.0)
MCH: 31.1 pg (ref 26.0–34.0)
MCHC: 32.3 g/dL (ref 30.0–36.0)
MCV: 96.3 fL (ref 80.0–100.0)
Monocytes Absolute: 0.4 10*3/uL (ref 0.1–1.0)
Monocytes Relative: 12 %
Neutro Abs: 1.5 10*3/uL — ABNORMAL LOW (ref 1.7–7.7)
Neutrophils Relative %: 49 %
Platelet Count: 161 10*3/uL (ref 150–400)
RBC: 4.08 MIL/uL (ref 3.87–5.11)
RDW: 12.6 % (ref 11.5–15.5)
WBC Count: 3.1 10*3/uL — ABNORMAL LOW (ref 4.0–10.5)
nRBC: 0 % (ref 0.0–0.2)

## 2018-09-29 LAB — CMP (CANCER CENTER ONLY)
ALT: 19 U/L (ref 0–44)
AST: 26 U/L (ref 15–41)
Albumin: 3.4 g/dL — ABNORMAL LOW (ref 3.5–5.0)
Alkaline Phosphatase: 66 U/L (ref 38–126)
Anion gap: 7 (ref 5–15)
BUN: 13 mg/dL (ref 8–23)
CO2: 27 mmol/L (ref 22–32)
Calcium: 8.6 mg/dL — ABNORMAL LOW (ref 8.9–10.3)
Chloride: 111 mmol/L (ref 98–111)
Creatinine: 1 mg/dL (ref 0.44–1.00)
GFR, Est AFR Am: >60 mL/min (ref >60)
GFR, Estimated: 55 mL/min — ABNORMAL LOW (ref >60)
Glucose, Bld: 95 mg/dL (ref 70–99)
Potassium: 3.6 mmol/L (ref 3.5–5.1)
Sodium: 145 mmol/L (ref 135–145)
Total Bilirubin: 0.4 mg/dL (ref 0.3–1.2)
Total Protein: 7.5 g/dL (ref 6.5–8.1)

## 2018-09-29 NOTE — Telephone Encounter (Signed)
Scheduled appt per 6/26 los. Printed calendar and avs.

## 2018-09-29 NOTE — Progress Notes (Signed)
Hematology and Oncology Follow Up Visit  [name] [id number] 1945-07-21 73 y.o. 09/29/2018 3:09 PM   Principle Diagnosis: 73 year old woman with:  1.  Stage IV colon cancer without any evidence of disease diagnosed in 2011.  She is status post back resection.  2.  Lumbar spine changes with work-up did not reveal any abnormalities in October 2018 including bone marrow biopsy.   Prior Therapy:  She is status post colectomy as well as radiofrequency ablation to an isolated liver met on 05/05/2009 at [facility].  She is status post chemotherapy with FOLFOX 6 and Avastin from 06/08/2009 through 12/19/2009 requiring dose reduction for thrombocytopenia and leukopenia. CT scan findings were retroperitoneal and anterior adenopathy status post CT guided core biopsy on 03/15/2011 showed no evidence  of malignancy except for fibrosis. 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 She status post bone marrow biopsy completed May 2019.  No disease detected at the time.  Current therapy: Active surveillance.  Interim History: [name] is here for repeat evaluation.  Since the last visit, he reports no major changes in her health.  She continues to have chronic pain related to postherpetic neuralgia without any new complaints at this time.  She still ambulating without any difficulties.  She denies any recent falls or syncope.  He does report chronic knee shoulder pain that has not changed at this time.  He continues to eat reasonably well without any weight loss.  She denied recent falls or syncope.  She denied any alteration mental status, neuropathy, confusion or dizziness.  Denies any headaches or lethargy.  Denies any night sweats, weight loss or changes in appetite.  Denied orthopnea, dyspnea on exertion or chest discomfort.  Denies shortness of breath, difficulty breathing hemoptysis or cough.  Denies any abdominal distention, nausea, early  satiety or dyspepsia.  Denies any hematuria, frequency, dysuria or nocturia.  Denies any skin irritation, dryness or rash.  Denies any ecchymosis or petechiae.  Denies any lymphadenopathy or clotting.  Denies any heat or cold intolerance.  Denies any anxiety or depression.  Remaining review of system is negative.        Medications: I have reviewed the patient's current medications. Current Outpatient Medications  Medication Sig Dispense Refill  . amoxicillin (AMOXIL) 500 MG capsule Take 1 capsule (500 mg total) by mouth 3 (three) times daily. 30 capsule 0  . cloNIDine (CATAPRES) 0.1 MG tablet Take 0.1 mg by mouth 2 (two) times daily.     . diclofenac sodium (VOLTAREN) 1 % GEL Apply 2 g topically 4 (four) times daily. 1 Tube 0  . escitalopram (LEXAPRO) 10 MG tablet Take 10 mg by mouth daily.  3  . Fluticasone-Salmeterol (ADVAIR) 100-50 MCG/DOSE AEPB Inhale 2 puffs into the lungs 2 (two) times daily as needed (wheezing).     . gabapentin (NEURONTIN) 300 MG capsule Take 600 mg by mouth 3 (three) times daily.  1  . levETIRAcetam (KEPPRA) 500 MG tablet Take 1 tablet (500 mg total) by mouth 2 (two) times daily. 180 tablet 3  . lidocaine (LIDODERM) 5 % Place 1 patch onto the skin daily. Apply at 8 am and remove at 8 pm  6  . losartan-hydrochlorothiazide (HYZAAR) 100-12.5 MG tablet Take 1 tablet by mouth daily.    . meclizine (ANTIVERT) 12.5 MG tablet Take 1 tablet (12.5 mg total) by mouth 3 (three) times daily as needed for dizziness. 30 tablet 0  . memantine (NAMENDA) 10 MG tablet  Take 1 tablet (10 mg total) by mouth 2 (two) times daily. 180 tablet 3  . Multiple Vitamins-Minerals (CENTRUM SILVER ADULT 50+ PO) Take 1 tablet by mouth daily.    Gean Birchwood ER 50 MG 12 hr tablet Limit 1 tablet by mouth every 12 hours tolerated.NO HYDROCODONE/ACETAMINOPHEN  0  . polyethylene glycol (MIRALAX / GLYCOLAX) packet Take 17 g by mouth daily as needed for mild constipation.    . potassium chloride SA  (K-DUR,KLOR-CON) 20 MEQ tablet Take 20 mEq by mouth daily.     Marland Kitchen TAZTIA XT 180 MG 24 hr capsule Take by mouth daily.  5   No current facility-administered medications for this visit.     Allergies: No Known Allergies  Past Medical History, Surgical history, Social history, and Family History were reviewed and updated.   Physical Exam: Blood pressure (!) 142/70, pulse 76, temperature 98.2 F (36.8 C), temperature source Oral, resp. rate 17, weight 166 lb 5 oz (75.4 kg), SpO2 99 %.   ECOG: 1   General appearance: Alert, awake without any distress. Head: Atraumatic without abnormalities Oropharynx: Without any thrush or ulcers. Eyes: No scleral icterus. Lymph nodes: No lymphadenopathy noted in the cervical, supraclavicular, or axillary nodes Heart:regular rate and rhythm, without any murmurs or gallops.   Lung: Clear to auscultation without any rhonchi, wheezes or dullness to percussion. Abdomin: Soft, nontender without any shifting dullness or ascites. Musculoskeletal: No clubbing or cyanosis. Neurological: No motor or sensory deficits. Skin: No rashes or lesions.   Lab Results: Lab Results  Component Value Date   WBC 2.7 (L) 08/08/2017   HGB 12.7 08/08/2017   HCT 38.7 08/08/2017   MCV 94.6 08/08/2017   PLT 143 (L) 08/08/2017     Chemistry      Component Value Date/Time   NA 140 07/01/2017 1454   NA 140 03/28/2017 0909   K 4.2 07/01/2017 1454   K 3.7 03/28/2017 0909   CL 106 07/01/2017 1454   CL 106 06/19/2012 0936   CO2 30 (H) 07/01/2017 1454   CO2 27 03/28/2017 0909   BUN 11 07/01/2017 1454   BUN 10.8 03/28/2017 0909   CREATININE 0.85 07/01/2017 1454   CREATININE 0.8 03/28/2017 0909      Component Value Date/Time   CALCIUM 8.8 07/01/2017 1454   CALCIUM 8.7 03/28/2017 0909   ALKPHOS 59 07/01/2017 1454   ALKPHOS 54 03/28/2017 0909   AST 17 07/01/2017 1454   AST 17 03/28/2017 0909   ALT 10 07/01/2017 1454   ALT 10 03/28/2017 0909   BILITOT 0.3 07/01/2017  1454   BILITOT 0.34 03/28/2017 0909      Current Outpatient Medications  Medication Sig Dispense Refill  . amoxicillin (AMOXIL) 500 MG capsule Take 1 capsule (500 mg total) by mouth 3 (three) times daily. 30 capsule 0  . cloNIDine (CATAPRES) 0.1 MG tablet Take 0.1 mg by mouth 2 (two) times daily.     . diclofenac sodium (VOLTAREN) 1 % GEL Apply 2 g topically 4 (four) times daily. 1 Tube 0  . escitalopram (LEXAPRO) 10 MG tablet Take 10 mg by mouth daily.  3  . Fluticasone-Salmeterol (ADVAIR) 100-50 MCG/DOSE AEPB Inhale 2 puffs into the lungs 2 (two) times daily as needed (wheezing).     . gabapentin (NEURONTIN) 300 MG capsule Take 600 mg by mouth 3 (three) times daily.  1  . levETIRAcetam (KEPPRA) 500 MG tablet Take 1 tablet (500 mg total) by mouth 2 (two) times daily. 180 tablet  3  . lidocaine (LIDODERM) 5 % Place 1 patch onto the skin daily. Apply at 8 am and remove at 8 pm  6  . losartan-hydrochlorothiazide (HYZAAR) 100-12.5 MG tablet Take 1 tablet by mouth daily.    . meclizine (ANTIVERT) 12.5 MG tablet Take 1 tablet (12.5 mg total) by mouth 3 (three) times daily as needed for dizziness. 30 tablet 0  . memantine (NAMENDA) 10 MG tablet Take 1 tablet (10 mg total) by mouth 2 (two) times daily. 180 tablet 3  . Multiple Vitamins-Minerals (CENTRUM SILVER ADULT 50+ PO) Take 1 tablet by mouth daily.    Gean Birchwood ER 50 MG 12 hr tablet Limit 1 tablet by mouth every 12 hours tolerated.NO HYDROCODONE/ACETAMINOPHEN  0  . polyethylene glycol (MIRALAX / GLYCOLAX) packet Take 17 g by mouth daily as needed for mild constipation.    . potassium chloride SA (K-DUR,KLOR-CON) 20 MEQ tablet Take 20 mEq by mouth daily.     Marland Kitchen TAZTIA XT 180 MG 24 hr capsule Take by mouth daily.  5   No current facility-administered medications for this visit.         Impression and Plan:  73 year old woman with:  1.  Stage IV colon cancer without any evidence of disease diagnosed in 2011.    He continues to be  disease-free without any evidence of relapse.  Imaging studies in May 2019 did not show any evidence to suggest relapse.  I have recommended continued active surveillance at this time.  We will repeat imaging studies only if she develop any symptoms.   2.  Lumbar spine changes: Her work-up did not reveal any major abnormalities at this time.  Bone marrow biopsy in 2019 was normal.  He denies any any back pain at this time.  No repeat imaging studies needed at this time.   3. Leukocytopenia: White cell count is improved today compared to previous testing.  No evidence of a blood disorder noted at this time and I recommended periodic monitoring.  4.  Postherpetic neuralgia: Chronic in nature and is unchanged.  5. Followup: Will be in 1 year for repeat evaluation.   15  minutes was spent with the patient face-to-face today.  More than 50% of time was spent on reviewing her disease status, reviewing laboratory data and answering questions regarding future plan of care.      Zola Button, MD 6/26/20203:09 PM

## 2018-10-02 ENCOUNTER — Ambulatory Visit: Payer: Medicare Other | Admitting: Nurse Practitioner

## 2018-10-02 ENCOUNTER — Ambulatory Visit: Payer: Medicare Other | Admitting: Neurology

## 2019-09-28 ENCOUNTER — Other Ambulatory Visit: Payer: Self-pay

## 2019-09-28 ENCOUNTER — Inpatient Hospital Stay (HOSPITAL_BASED_OUTPATIENT_CLINIC_OR_DEPARTMENT_OTHER): Payer: Medicare (Managed Care) | Admitting: Oncology

## 2019-09-28 ENCOUNTER — Inpatient Hospital Stay: Payer: Medicare (Managed Care) | Attending: Oncology

## 2019-09-28 VITALS — BP 176/73 | HR 62 | Temp 97.7°F | Resp 18 | Ht 62.0 in | Wt 163.0 lb

## 2019-09-28 DIAGNOSIS — C189 Malignant neoplasm of colon, unspecified: Secondary | ICD-10-CM | POA: Diagnosis not present

## 2019-09-28 DIAGNOSIS — Z791 Long term (current) use of non-steroidal anti-inflammatories (NSAID): Secondary | ICD-10-CM | POA: Diagnosis not present

## 2019-09-28 DIAGNOSIS — Z85038 Personal history of other malignant neoplasm of large intestine: Secondary | ICD-10-CM

## 2019-09-28 DIAGNOSIS — Z7951 Long term (current) use of inhaled steroids: Secondary | ICD-10-CM | POA: Diagnosis not present

## 2019-09-28 DIAGNOSIS — Z9221 Personal history of antineoplastic chemotherapy: Secondary | ICD-10-CM | POA: Diagnosis not present

## 2019-09-28 DIAGNOSIS — Z9049 Acquired absence of other specified parts of digestive tract: Secondary | ICD-10-CM | POA: Insufficient documentation

## 2019-09-28 DIAGNOSIS — Z79899 Other long term (current) drug therapy: Secondary | ICD-10-CM | POA: Diagnosis not present

## 2019-09-28 DIAGNOSIS — G8929 Other chronic pain: Secondary | ICD-10-CM | POA: Diagnosis not present

## 2019-09-28 DIAGNOSIS — Z86718 Personal history of other venous thrombosis and embolism: Secondary | ICD-10-CM | POA: Diagnosis not present

## 2019-09-28 DIAGNOSIS — Z7901 Long term (current) use of anticoagulants: Secondary | ICD-10-CM | POA: Diagnosis not present

## 2019-09-28 DIAGNOSIS — D61818 Other pancytopenia: Secondary | ICD-10-CM | POA: Diagnosis not present

## 2019-09-28 DIAGNOSIS — B0229 Other postherpetic nervous system involvement: Secondary | ICD-10-CM | POA: Insufficient documentation

## 2019-09-28 LAB — CBC WITH DIFFERENTIAL (CANCER CENTER ONLY)
Abs Immature Granulocytes: 0 10*3/uL (ref 0.00–0.07)
Basophils Absolute: 0 10*3/uL (ref 0.0–0.1)
Basophils Relative: 1 %
Eosinophils Absolute: 0 10*3/uL (ref 0.0–0.5)
Eosinophils Relative: 0 %
HCT: 37.4 % (ref 36.0–46.0)
Hemoglobin: 11.8 g/dL — ABNORMAL LOW (ref 12.0–15.0)
Immature Granulocytes: 0 %
Lymphocytes Relative: 32 %
Lymphs Abs: 0.8 10*3/uL (ref 0.7–4.0)
MCH: 31.6 pg (ref 26.0–34.0)
MCHC: 31.6 g/dL (ref 30.0–36.0)
MCV: 100 fL (ref 80.0–100.0)
Monocytes Absolute: 0.3 10*3/uL (ref 0.1–1.0)
Monocytes Relative: 11 %
Neutro Abs: 1.3 10*3/uL — ABNORMAL LOW (ref 1.7–7.7)
Neutrophils Relative %: 56 %
Platelet Count: 96 10*3/uL — ABNORMAL LOW (ref 150–400)
RBC: 3.74 MIL/uL — ABNORMAL LOW (ref 3.87–5.11)
RDW: 12.9 % (ref 11.5–15.5)
WBC Count: 2.4 10*3/uL — ABNORMAL LOW (ref 4.0–10.5)
nRBC: 0 % (ref 0.0–0.2)

## 2019-09-28 LAB — CMP (CANCER CENTER ONLY)
ALT: 10 U/L (ref 0–44)
AST: 16 U/L (ref 15–41)
Albumin: 3.1 g/dL — ABNORMAL LOW (ref 3.5–5.0)
Alkaline Phosphatase: 75 U/L (ref 38–126)
Anion gap: 8 (ref 5–15)
BUN: 8 mg/dL (ref 8–23)
CO2: 28 mmol/L (ref 22–32)
Calcium: 8.4 mg/dL — ABNORMAL LOW (ref 8.9–10.3)
Chloride: 114 mmol/L — ABNORMAL HIGH (ref 98–111)
Creatinine: 0.87 mg/dL (ref 0.44–1.00)
GFR, Est AFR Am: >60 mL/min (ref >60)
GFR, Estimated: >60 mL/min (ref >60)
Glucose, Bld: 85 mg/dL (ref 70–99)
Potassium: 4.3 mmol/L (ref 3.5–5.1)
Sodium: 150 mmol/L — ABNORMAL HIGH (ref 135–145)
Total Bilirubin: 0.3 mg/dL (ref 0.3–1.2)
Total Protein: 6.6 g/dL (ref 6.5–8.1)

## 2019-09-28 NOTE — Progress Notes (Signed)
Hematology and Oncology Follow Up Visit  [name] [id number] 1945/12/21 74 y.o. 09/28/2019 10:32 AM   Principle Diagnosis: 74 year old woman with colon cancer diagnosed in 2011.  She developed stage IV disease and subsequently surgically removed and has been disease-free for the last 10 years.   Prior Therapy:  She is status post colectomy as well as radiofrequency ablation to an isolated liver met on 05/05/2009 at [facility].  She is status post chemotherapy with FOLFOX 6 and Avastin from 06/08/2009 through 12/19/2009 requiring dose reduction for thrombocytopenia and leukopenia. CT scan findings were retroperitoneal and anterior adenopathy status post CT guided core biopsy on 03/15/2011 showed no evidence  of malignancy except for fibrosis. 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 She status post bone marrow biopsy completed May 2019.  No disease detected at the time.  Current therapy: Active surveillance.  Interim History: Ms. Haseman returns today for a follow-up visit.  Since the last visit, she reports no major changes in her health.  He denies any hospitalization or illnesses.  She does report chronic pain related to her previous zoster infection.  Her pain is predominantly on the left back and side without any recent exacerbation.  She denies any recurrent infections or GI symptoms.       Medications: Reviewed without changes. Current Outpatient Medications  Medication Sig Dispense Refill  . amoxicillin (AMOXIL) 500 MG capsule Take 1 capsule (500 mg total) by mouth 3 (three) times daily. 30 capsule 0  . cloNIDine (CATAPRES) 0.1 MG tablet Take 0.1 mg by mouth 2 (two) times daily.     . diclofenac sodium (VOLTAREN) 1 % GEL Apply 2 g topically 4 (four) times daily. 1 Tube 0  . escitalopram (LEXAPRO) 10 MG tablet Take 10 mg by mouth daily.  3  . Fluticasone-Salmeterol (ADVAIR) 100-50 MCG/DOSE AEPB Inhale 2 puffs into the  lungs 2 (two) times daily as needed (wheezing).     . gabapentin (NEURONTIN) 300 MG capsule Take 600 mg by mouth 3 (three) times daily.  1  . levETIRAcetam (KEPPRA) 500 MG tablet Take 1 tablet (500 mg total) by mouth 2 (two) times daily. 180 tablet 3  . lidocaine (LIDODERM) 5 % Place 1 patch onto the skin daily. Apply at 8 am and remove at 8 pm  6  . losartan-hydrochlorothiazide (HYZAAR) 100-12.5 MG tablet Take 1 tablet by mouth daily.    . meclizine (ANTIVERT) 12.5 MG tablet Take 1 tablet (12.5 mg total) by mouth 3 (three) times daily as needed for dizziness. 30 tablet 0  . memantine (NAMENDA) 10 MG tablet Take 1 tablet (10 mg total) by mouth 2 (two) times daily. 180 tablet 3  . Multiple Vitamins-Minerals (CENTRUM SILVER ADULT 50+ PO) Take 1 tablet by mouth daily.    Gean Birchwood ER 50 MG 12 hr tablet Limit 1 tablet by mouth every 12 hours tolerated.NO HYDROCODONE/ACETAMINOPHEN  0  . polyethylene glycol (MIRALAX / GLYCOLAX) packet Take 17 g by mouth daily as needed for mild constipation.    . potassium chloride SA (K-DUR,KLOR-CON) 20 MEQ tablet Take 20 mEq by mouth daily.     Marland Kitchen TAZTIA XT 180 MG 24 hr capsule Take by mouth daily.  5   No current facility-administered medications for this visit.    Allergies: No Known Allergies    Physical Exam:   ECOG: 1   General appearance: Comfortable appearing without any discomfort Head: Normocephalic without any trauma Oropharynx: Mucous membranes are  moist and pink without any thrush or ulcers. Eyes: Pupils are equal and round reactive to light. Lymph nodes: No cervical, supraclavicular, inguinal or axillary lymphadenopathy.   Heart:regular rate and rhythm.  S1 and S2 without leg edema. Lung: Clear without any rhonchi or wheezes.  No dullness to percussion. Abdomin: Soft, nontender, nondistended with good bowel sounds.  No hepatosplenomegaly. Musculoskeletal: No joint deformity or effusion.  Full range of motion noted. Neurological: No deficits  noted on motor, sensory and deep tendon reflex exam. Skin: No petechial rash or dryness.  Appeared moist.     Lab Results: Lab Results  Component Value Date   WBC 2.4 (L) 09/28/2019   HGB 11.8 (L) 09/28/2019   HCT 37.4 09/28/2019   MCV 100.0 09/28/2019   PLT 96 (L) 09/28/2019     Chemistry      Component Value Date/Time   NA 145 09/29/2018 1459   NA 140 03/28/2017 0909   K 3.6 09/29/2018 1459   K 3.7 03/28/2017 0909   CL 111 09/29/2018 1459   CL 106 06/19/2012 0936   CO2 27 09/29/2018 1459   CO2 27 03/28/2017 0909   BUN 13 09/29/2018 1459   BUN 10.8 03/28/2017 0909   CREATININE 1.00 09/29/2018 1459   CREATININE 0.8 03/28/2017 0909      Component Value Date/Time   CALCIUM 8.6 (L) 09/29/2018 1459   CALCIUM 8.7 03/28/2017 0909   ALKPHOS 66 09/29/2018 1459   ALKPHOS 54 03/28/2017 0909   AST 26 09/29/2018 1459   AST 17 03/28/2017 0909   ALT 19 09/29/2018 1459   ALT 10 03/28/2017 0909   BILITOT 0.4 09/29/2018 1459   BILITOT 0.34 03/28/2017 0909            Impression and Plan:  74 year old woman with:  1.  Colon cancer diagnosed in 2011.  She presented with stage IV disease and remains without any evidence of recurrence after surgical resection and adjuvant chemotherapy.  She is currently on active surveillance and over 10 years from her definitive therapy.  Risks and benefits of continued active surveillance were reviewed and at this time I see no indication to continued surveillance.   2.  Mild pancytopenia: Has been chronic in nature and fluctuating for many years.  Bone marrow biopsy obtained in 2019 which showed no abnormalities.  CBC reviewed today and showed adequate counts without any need for intervention.  Risks and benefits of continuing annual surveillance of her counts were reviewed.  She prefers to limit her doctors appointments and she was curious about stopping her visits at this time.  With the chronicity of these findings and the mild and  fluctuating nature I see no reason to continue with routine follow-up but certainly more than happy to address these issues if they worsen in the future.   3.  Postherpetic neuralgia: Related to previous zoster infection without any recent exacerbation.  4. Followup: My happy to see her in the future as needed.   20  minutes were dedicated to this encounter.  Time was spent on reviewing her disease status, reviewing laboratory data and future plan of care discussion.      Zola Button, MD 6/25/202110:32 AM

## 2020-04-09 ENCOUNTER — Other Ambulatory Visit: Payer: Self-pay | Admitting: Family Medicine

## 2020-04-09 DIAGNOSIS — Z13820 Encounter for screening for osteoporosis: Secondary | ICD-10-CM

## 2020-07-24 ENCOUNTER — Other Ambulatory Visit: Payer: Medicare (Managed Care)

## 2020-10-15 NOTE — Progress Notes (Addendum)
[facility] Note  10/17/2020     CHIEF COMPLAINT Patient presents for Retina Evaluation (New patient referral from Dr. Kathlen Mody for BRAO OS. Pt states about a month ago she noticed a haze over vision in OS. She then went to see Dr. Kathlen Mody 1 week ago and he referred her here. Pt states she sometimes sees flashes of light and dark spots when looking at/in bright areas of light. )   HISTORY OF PRESENT ILLNESS: [name] is a 75 y.o. female who presents to the clinic today for:   HPI     Retina Evaluation   In left eye.  This started 1 month ago.  Duration of 1 week.  Associated Symptoms Flashes.  I, the attending physician,  performed the HPI with the patient and updated documentation appropriately. Additional comments: New patient referral from Dr. Kathlen Mody for BRAO OS. Pt states about a month ago she noticed a haze over vision in OS. She then went to see Dr. Kathlen Mody 1 week ago and he referred her here. Pt states she sometimes sees flashes of light and dark spots when looking at/in bright areas of light.       Last edited by Bernarda Caffey, MD on 10/19/2020 11:38 PM.    Pt is here on referral of Dr. Kathlen Mody for concern of BRAO OS, pt states he has been her regular eye dr since she has been with [facility], pt states she has noticed that her left eye vision has been decreasing, pt denies hx of heart attack or stroke, but daughter says she has had seizures in the past, pt states she is not on blood thinners currently, but she does have a clotting disorder  Referring physician: Hortencia Pilar, MD [place],  [place] [address]  HISTORICAL INFORMATION:   Selected notes from the MEDICAL RECORD NUMBER Referred by Dr. Kathlen Mody for concern of BRVO OS LEE: 07.07.22 Read Drivers) [BCVA: OD: 20/40++ OS: CF$RemoveBefor'@2'cjMowpEutLop$ ' Ocular Hx-drusen, PVD, pseudo PMH-    CURRENT MEDICATIONS: No current outpatient medications on file. (Ophthalmic Drugs)   No current  facility-administered medications for this visit. (Ophthalmic Drugs)   Current Outpatient Medications (Other)  Medication Sig   amoxicillin (AMOXIL) 500 MG capsule Take 1 capsule (500 mg total) by mouth 3 (three) times daily.   cloNIDine (CATAPRES) 0.1 MG tablet Take 0.1 mg by mouth 2 (two) times daily.    diclofenac sodium (VOLTAREN) 1 % GEL Apply 2 g topically 4 (four) times daily.   escitalopram (LEXAPRO) 10 MG tablet Take 10 mg by mouth daily.   Fluticasone-Salmeterol (ADVAIR) 100-50 MCG/DOSE AEPB Inhale 2 puffs into the lungs 2 (two) times daily as needed (wheezing).    gabapentin (NEURONTIN) 300 MG capsule Take 600 mg by mouth 3 (three) times daily.   levETIRAcetam (KEPPRA) 500 MG tablet Take 1 tablet (500 mg total) by mouth 2 (two) times daily.   lidocaine (LIDODERM) 5 % Place 1 patch onto the skin daily. Apply at 8 am and remove at 8 pm   losartan-hydrochlorothiazide (HYZAAR) 100-12.5 MG tablet Take 1 tablet by mouth daily.   meclizine (ANTIVERT) 12.5 MG tablet Take 1 tablet (12.5 mg total) by mouth 3 (three) times daily as needed for dizziness.   memantine (NAMENDA) 10 MG tablet Take 1 tablet (10 mg total) by mouth 2 (two) times daily.   Multiple Vitamins-Minerals (CENTRUM SILVER ADULT 50+ PO) Take 1 tablet by mouth daily.   NUCYNTA ER 50  MG 12 hr tablet Limit 1 tablet by mouth every 12 hours tolerated.NO HYDROCODONE/ACETAMINOPHEN   polyethylene glycol (MIRALAX / GLYCOLAX) packet Take 17 g by mouth daily as needed for mild constipation.   potassium chloride SA (K-DUR,KLOR-CON) 20 MEQ tablet Take 20 mEq by mouth daily.    TAZTIA XT 180 MG 24 hr capsule Take by mouth daily.   No current facility-administered medications for this visit. 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   chemo comp 12/19/09   PONV (postoperative nausea and vomiting)    Seizures ([facility])    Shingles rash    Spinal headache    Past Surgical History:  Procedure Laterality Date   ABDOMINAL HYSTERECTOMY  2012   CATARACT EXTRACTION     COLON SURGERY     COLONOSCOPY     PORTACATH PLACEMENT     portacath removal     RADIOFREQUENCY ABLATION LIVER TUMOR     SMALL INTESTINE SURGERY      FAMILY HISTORY Family History  Problem Relation Age of Onset   Hypertension Father    Cancer Brother        colon   Cancer Daughter        colon   Breast cancer Sister     SOCIAL HISTORY Social History   Tobacco Use   Smoking status: Never   Smokeless tobacco: Never  Substance Use Topics   Alcohol use: No   Drug use: No         OPHTHALMIC EXAM:  Base Eye Exam     Visual Acuity (Snellen - Linear)       Right Left   Dist cc 20/40 CF at 3'   Dist ph cc 20/30 -2 NI    Correction: Glasses         Tonometry (Tonopen, 2:01 PM)       Right Left   Pressure 19 19         Pupils       Dark Light Shape React APD   Right 2 2 Round Sluggish None   Left 2 2 Round Sluggish None         Visual Fields (Counting fingers)       Left Right    Full Full         Extraocular Movement       Right Left    Full, Ortho Full, Ortho         Neuro/Psych     Oriented x3: Yes   Mood/Affect: Normal         Dilation     Both eyes: 1.0% Mydriacyl, 2.5% Phenylephrine @ 2:01 PM           Slit Lamp and Fundus Exam     Slit Lamp Exam       Right Left   Lids/Lashes Dermatochalasis - upper lid Dermatochalasis - upper lid   Conjunctiva/Sclera mild melanosis, mild, inferior Conjunctivochalasis mild melanosis, mild, inferior Conjunctivochalasis   Cornea arcus, trace PEE, tear film debris arcus,  1+PEE, tear film debris    Anterior Chamber Deep and quiet Deep and quiet   Iris Round and dilated Round and dilated   Lens PC IOL in good position, 1+ Posterior capsular opacification PC IOL in good position, 1+ Posterior capsular opacification   Vitreous Vitreous syneresis Vitreous syneresis         Fundus Exam       Right Left   Disc Pink and Sharp 2+ Pallor, disc heme at 0800   C/D Ratio 0.4 0.4   Macula Flat, Blunted foveal reflex, RPE mottling, No heme or edema Flat, Good foveal reflex, residual band of retinal whitening superior macula, +ischemia and atrophy   Vessels attenuated, Tortuous, mild Copper wiring Severe attenuation, Tortuous, Dilated venueles -- remote CRAO   Periphery Attached, No heme  Attached              Refraction     Wearing Rx       Sphere Cylinder Axis Add   Right -1.00 +3.00 080 +3.75   Left -1.50 +3.25 091 +3.75         Manifest Refraction       Sphere Cylinder Axis Dist VA   Right -1.75 +3.25 080 20/30-2   Left -1.50 +3.25 090 NI            IMAGING AND PROCEDURES  Imaging and Procedures for 10/17/2020  OCT, Retina - OU - Both Eyes       Right Eye Quality was good. Central Foveal Thickness: 259. Progression has no prior data. Findings include normal foveal contour, no IRF, no SRF, retinal drusen .   Left Eye Quality was good. Central Foveal Thickness: 313. Progression has no prior data. Findings include normal foveal contour, no IRF, no SRF, intraretinal hyper-reflective material, inner retinal atrophy, retinal drusen .   Notes *Images captured and stored on drive  Diagnosis / Impression:  OD: NFP, no IRF/SRF OS: CRAO, diffuse IRA/IRHM  Clinical management:  See below  Abbreviations: NFP - Normal foveal profile. CME - cystoid macular edema. PED - pigment epithelial detachment. IRF - intraretinal fluid. SRF - subretinal fluid. EZ - ellipsoid zone. ERM - epiretinal membrane. ORA - outer retinal atrophy. ORT - outer retinal tubulation. SRHM -  subretinal hyper-reflective material. IRHM - intraretinal hyper-reflective material      Fluorescein Angiography Optos (Transit OS)       Right Eye Early phase findings include normal observations. Mid/Late phase findings include normal observations.   Left Eye Early phase findings include delayed filling, vascular perfusion defect (Complete non-perfusion of inferior hemisphere extending to fovea). Mid/Late phase findings include vascular perfusion defect, leakage, staining (Persistent non-perfusion of inferior periphery, macula with delayed signal).   Notes **Images stored on drive**  Impression: OD: normal study OS: CRAO -- Persistent non-perfusion of inferior periphery, macula with delayed signal              ASSESSMENT/PLAN:    ICD-10-CM   1. Central retinal artery occlusion of left eye  H34.12     2. Retinal edema  H35.81 OCT, Retina - OU - Both Eyes    3. Essential hypertension  I10     4. Hypertensive retinopathy of both eyes  H35.033 Fluorescein Angiography Optos (Transit OS)    5. Pseudophakia of both eyes  Z96.1       1,2. Remote CRAO OS  - 1 wk history of decreased vision (onset around mid-June)  - BCVA CF 3'  - exam without retinal whitening  or cherry red spot  - OCT shows inner retinal atrophy and IRHM; no CME OS  - FA (07.15.22) shows nonperfusion of inferior hemisphere with severely delayed filling of macula consistent with RAO  - discussed findings and stroke equivalency, guarded prognosis  - review of systems for GCA negative  - recommend optimization of cardiovascular risk factors w/ PCP  - recommend checking ESR, CRP and platelets for GCA r/o  - recommend carotid dopplers and echocardiogram to check for embolic sources  - will send letter to  PCP - f/u 2 months -- DFE/OCT, possible injection  3,4. Hypertensive retinopathy OU - discussed importance of tight BP control - monitor  5. Pseudophakia OU  - s/p CE/IOL OU  - IOL in good  position, doing well  - monitor   Ophthalmic Meds Ordered this visit:  No orders of the defined types were placed in this encounter.      Return for f/u 2 months, CRAO OS, DFE, OCT.  There are no Patient Instructions on file for this visit.   Explained the diagnoses, plan, and follow up with the patient and they expressed understanding.  Patient expressed understanding of the importance of proper follow up care.   This document serves as a record of services personally performed by Gardiner Sleeper, MD, PhD. It was created on their behalf by San Jetty. Owens Shark, OA an ophthalmic technician. The creation of this record is the provider's dictation and/or activities during the visit.    Electronically signed by: San Jetty. Marguerita Merles 07.13.2022 12:07 AM   Gardiner Sleeper, M.D., Ph.D. Diseases & Surgery of the Retina and Vitreous [facility]  I have reviewed the above documentation for accuracy and completeness, and I agree with the above. Gardiner Sleeper, M.D., Ph.D. 10/20/20 12:07 AM  Abbreviations: M myopia (nearsighted); A astigmatism; H hyperopia (farsighted); P presbyopia; Mrx spectacle prescription;  CTL contact lenses; OD right eye; OS left eye; OU both eyes  XT exotropia; ET esotropia; PEK punctate epithelial keratitis; PEE punctate epithelial erosions; DES dry eye syndrome; MGD meibomian gland dysfunction; ATs artificial tears; PFAT's preservative free artificial tears; [place] nuclear sclerotic cataract; PSC posterior subcapsular cataract; ERM epi-retinal membrane; PVD posterior vitreous detachment; RD retinal detachment; DM diabetes mellitus; DR diabetic retinopathy; NPDR non-proliferative diabetic retinopathy; PDR proliferative diabetic retinopathy; CSME clinically significant macular edema; DME diabetic macular edema; dbh dot blot hemorrhages; CWS cotton wool spot; POAG primary open angle glaucoma; C/D cup-to-disc ratio; HVF humphrey visual field; GVF goldmann  visual field; OCT optical coherence tomography; IOP intraocular pressure; BRVO Branch retinal vein occlusion; CRVO central retinal vein occlusion; CRAO central retinal artery occlusion; BRAO branch retinal artery occlusion; RT retinal tear; SB scleral buckle; PPV pars plana vitrectomy; VH Vitreous hemorrhage; PRP panretinal laser photocoagulation; IVK intravitreal kenalog; VMT vitreomacular traction; MH Macular hole;  NVD neovascularization of the disc; NVE neovascularization elsewhere; AREDS age related eye disease study; ARMD age related macular degeneration; POAG primary open angle glaucoma; EBMD epithelial/anterior basement membrane dystrophy; ACIOL anterior chamber intraocular lens; IOL intraocular lens; PCIOL posterior chamber intraocular lens; Phaco/IOL phacoemulsification with intraocular lens placement; [place] photorefractive keratectomy; LASIK laser assisted in situ keratomileusis; HTN hypertension; DM diabetes mellitus; COPD chronic obstructive pulmonary disease

## 2020-10-17 ENCOUNTER — Encounter (INDEPENDENT_AMBULATORY_CARE_PROVIDER_SITE_OTHER): Payer: Self-pay | Admitting: Ophthalmology

## 2020-10-17 ENCOUNTER — Other Ambulatory Visit: Payer: Self-pay

## 2020-10-17 ENCOUNTER — Ambulatory Visit (INDEPENDENT_AMBULATORY_CARE_PROVIDER_SITE_OTHER): Payer: Medicare (Managed Care) | Admitting: Ophthalmology

## 2020-10-17 ENCOUNTER — Encounter (INDEPENDENT_AMBULATORY_CARE_PROVIDER_SITE_OTHER): Payer: Medicare Other | Admitting: Ophthalmology

## 2020-10-17 DIAGNOSIS — H3412 Central retinal artery occlusion, left eye: Secondary | ICD-10-CM | POA: Diagnosis not present

## 2020-10-17 DIAGNOSIS — I10 Essential (primary) hypertension: Secondary | ICD-10-CM | POA: Diagnosis not present

## 2020-10-17 DIAGNOSIS — H3581 Retinal edema: Secondary | ICD-10-CM | POA: Diagnosis not present

## 2020-10-17 DIAGNOSIS — H35033 Hypertensive retinopathy, bilateral: Secondary | ICD-10-CM | POA: Diagnosis not present

## 2020-10-17 DIAGNOSIS — Z961 Presence of intraocular lens: Secondary | ICD-10-CM

## 2020-10-19 ENCOUNTER — Encounter (INDEPENDENT_AMBULATORY_CARE_PROVIDER_SITE_OTHER): Payer: Self-pay | Admitting: Ophthalmology

## 2020-10-28 ENCOUNTER — Other Ambulatory Visit: Payer: Self-pay | Admitting: Family Medicine

## 2020-10-28 DIAGNOSIS — H3589 Other specified retinal disorders: Secondary | ICD-10-CM

## 2020-10-28 DIAGNOSIS — H43819 Vitreous degeneration, unspecified eye: Secondary | ICD-10-CM

## 2020-10-28 DIAGNOSIS — I708 Atherosclerosis of other arteries: Secondary | ICD-10-CM

## 2020-10-28 DIAGNOSIS — I7090 Unspecified atherosclerosis: Secondary | ICD-10-CM

## 2020-11-12 ENCOUNTER — Ambulatory Visit
Admission: RE | Admit: 2020-11-12 | Discharge: 2020-11-12 | Disposition: A | Discharge status: Home / Self Care | Payer: Medicare (Managed Care) | Source: Ambulatory Visit | Attending: Family Medicine | Admitting: Family Medicine

## 2020-11-12 ENCOUNTER — Other Ambulatory Visit: Payer: Self-pay | Admitting: Family Medicine

## 2020-11-12 DIAGNOSIS — R059 Cough, unspecified: Secondary | ICD-10-CM

## 2020-11-24 ENCOUNTER — Ambulatory Visit
Admission: RE | Admit: 2020-11-24 | Discharge: 2020-11-24 | Disposition: A | Discharge status: Home / Self Care | Payer: Medicare (Managed Care) | Source: Ambulatory Visit | Attending: Family Medicine | Admitting: Family Medicine

## 2020-11-24 DIAGNOSIS — I708 Atherosclerosis of other arteries: Secondary | ICD-10-CM

## 2020-11-24 DIAGNOSIS — I7090 Unspecified atherosclerosis: Secondary | ICD-10-CM

## 2020-11-24 DIAGNOSIS — H3589 Other specified retinal disorders: Secondary | ICD-10-CM

## 2020-11-24 DIAGNOSIS — H43819 Vitreous degeneration, unspecified eye: Secondary | ICD-10-CM

## 2020-12-09 ENCOUNTER — Other Ambulatory Visit (HOSPITAL_COMMUNITY): Payer: Self-pay

## 2020-12-09 DIAGNOSIS — R011 Cardiac murmur, unspecified: Secondary | ICD-10-CM

## 2020-12-10 ENCOUNTER — Ambulatory Visit (HOSPITAL_COMMUNITY)
Admission: RE | Admit: 2020-12-10 | Discharge: 2020-12-10 | Disposition: A | Discharge status: Home / Self Care | Payer: Medicare (Managed Care) | Source: Ambulatory Visit

## 2020-12-10 ENCOUNTER — Other Ambulatory Visit: Payer: Self-pay

## 2020-12-10 ENCOUNTER — Encounter (HOSPITAL_COMMUNITY): Payer: Self-pay

## 2020-12-10 DIAGNOSIS — R011 Cardiac murmur, unspecified: Secondary | ICD-10-CM

## 2020-12-18 NOTE — Progress Notes (Signed)
[facility] Note  12/26/2020     CHIEF COMPLAINT Patient presents for Retina Follow Up   HISTORY OF PRESENT ILLNESS: [name] is a 75 y.o. female who presents to the clinic today for:   HPI     Retina Follow Up   Patient presents with  Other.  In left eye.  This started 2 months ago.  Severity is severe.  Duration of 9 weeks.  Since onset it is stable.        Comments   75 y/o female pt here for 9 wk f/u for remote CRAO OS.  No change in [place] OU.  Denies pain, FOL, floaters.  No gtts.      Last edited by Matthew Folks, COA on 12/21/2020 10:32 AM.    Pt states vision in left eye has not changed  Referring physician: Javier Docker, MD [address] [place],  [place] [address]  HISTORICAL INFORMATION:   Selected notes from the MEDICAL RECORD NUMBER Referred by Dr. Kathlen Mody for concern of BRVO OS LEE: 07.07.22 Read Drivers) [BCVA: OD: 20/40++ OS: CF$RemoveBefor'@2'rRPHWviLfgCh$ ' Ocular Hx-drusen, PVD, pseudo PMH-    CURRENT MEDICATIONS: No current facility-administered medications for this visit. (Ophthalmic Drugs)   No current outpatient medications on file. (Ophthalmic Drugs)   No current facility-administered medications for this visit. (Other)   Current Outpatient Medications (Other)  Medication Sig   amoxicillin (AMOXIL) 500 MG capsule Take 1 capsule (500 mg total) by mouth 3 (three) times daily.   cloNIDine (CATAPRES) 0.1 MG tablet Take 0.1 mg by mouth 2 (two) times daily.    diclofenac sodium (VOLTAREN) 1 % GEL Apply 2 g topically 4 (four) times daily.   escitalopram (LEXAPRO) 10 MG tablet Take 10 mg by mouth daily.   Fluticasone-Salmeterol (ADVAIR) 100-50 MCG/DOSE AEPB Inhale 2 puffs into the lungs 2 (two) times daily as needed (wheezing).    gabapentin (NEURONTIN) 300 MG capsule Take 600 mg by mouth 3 (three) times daily.   levETIRAcetam (KEPPRA) 500 MG tablet Take 1 tablet (500 mg total) by mouth 2 (two) times daily.   lidocaine  (LIDODERM) 5 % Place 1 patch onto the skin daily. Apply at 8 am and remove at 8 pm   losartan-hydrochlorothiazide (HYZAAR) 100-12.5 MG tablet Take 1 tablet by mouth daily.   meclizine (ANTIVERT) 12.5 MG tablet Take 1 tablet (12.5 mg total) by mouth 3 (three) times daily as needed for dizziness.   memantine (NAMENDA) 10 MG tablet Take 1 tablet (10 mg total) by mouth 2 (two) times daily.   Multiple Vitamins-Minerals (CENTRUM SILVER ADULT 50+ PO) Take 1 tablet by mouth daily.   NUCYNTA ER 50 MG 12 hr tablet Limit 1 tablet by mouth every 12 hours tolerated.NO HYDROCODONE/ACETAMINOPHEN   polyethylene glycol (MIRALAX / GLYCOLAX) packet Take 17 g by mouth daily as needed for mild constipation.   potassium chloride SA (K-DUR,KLOR-CON) 20 MEQ tablet Take 20 mEq by mouth daily.    TAZTIA XT 180 MG 24 hr capsule Take by mouth daily.   Facility-Administered Medications Ordered in Other Visits (Other)  Medication Route   labetalol (NORMODYNE) injection 20 mg Intravenous   sodium chloride flush (NS) 0.9 % injection 3 mL Intravenous      REVIEW OF SYSTEMS: ROS   Positive for: Neurological, Eyes Negative for: Constitutional, Gastrointestinal, Skin, Genitourinary, Musculoskeletal, HENT, Endocrine, Cardiovascular, Respiratory, Psychiatric, Allergic/Imm, Heme/Lymph Last edited by Matthew Folks, COA on 12/29/2020 10:26 AM.  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   chemo comp 12/19/09   PONV (postoperative nausea and vomiting)    Seizures (HCC)    Shingles rash    Spinal headache    Past Surgical History:  Procedure Laterality Date   ABDOMINAL HYSTERECTOMY  2012   CATARACT  EXTRACTION     COLON SURGERY     COLONOSCOPY     EYE SURGERY     PORTACATH PLACEMENT     portacath removal     RADIOFREQUENCY ABLATION LIVER TUMOR     SMALL INTESTINE SURGERY      FAMILY HISTORY Family History  Problem Relation Age of Onset   Hypertension Father    Cancer Brother        colon   Cancer Daughter        colon   Breast cancer Sister     SOCIAL HISTORY Social History   Tobacco Use   Smoking status: Never   Smokeless tobacco: Never  Substance Use Topics   Alcohol use: No   Drug use: No         OPHTHALMIC EXAM:  Base Eye Exam     Visual Acuity (Snellen - Linear)       Right Left   Dist cc 20/25 -2 HM   Dist ph cc NI NI    Correction: Glasses         Tonometry (Tonopen, 10:33 AM)       Right Left   Pressure 18 17         Pupils       Dark Light Shape React APD   Right 2 2 Round Sluggish None   Left 2 2 Round Sluggish None         Visual Fields (Counting fingers)       Left Right     Full   Restrictions Total superior temporal, inferior temporal, superior nasal, inferior nasal deficiencies          Extraocular Movement       Right Left    Full, Ortho Full, Ortho         Neuro/Psych     Oriented x3: Yes   Mood/Affect: Normal         Dilation     Both eyes: 1.0% Mydriacyl, 2.5% Phenylephrine @ 10:33 AM           Slit Lamp and Fundus Exam     External Exam       Right Left   External  left facial droop and left sided U and L extremity weakness         Slit Lamp Exam       Right Left   Lids/Lashes Dermatochalasis - upper lid Dermatochalasis - upper lid   Conjunctiva/Sclera mild melanosis, mild, inferior Conjunctivochalasis mild melanosis, mild, inferior Conjunctivochalasis   Cornea arcus, trace PEE, tear film debris arcus, 1+PEE, tear film debris   Anterior Chamber Deep and quiet Deep and quiet   Iris Round and dilated, No NVI Round and dilated, No NVI   Lens PC IOL in good position, 1+  Posterior capsular opacification PC IOL in good position, 1+ Posterior capsular opacification   Vitreous Vitreous syneresis Vitreous syneresis, vitreous condensations         Fundus Exam       Right Left   Disc Pink and Sharp 2+ Pallor, disc heme at 0800 - res, Sharp rim, +fibrosis   C/D Ratio 0.4 0.4   Macula Flat, Blunted foveal reflex, RPE mottling, No heme or edema Flat, blunted foveal reflex, diffuse atrophy, focal retinal whitening SN macula, just inside ST arcades, no heme   Vessels attenuated, Tortuous, mild Copper wiring Severe attenuation, ST venule 3+Tortuous with +blot heme, Dilated venueles -- remote CRAO   Periphery Attached, No heme  Attached               IMAGING AND PROCEDURES  Imaging and Procedures for 12/20/2020  I-stat chem 8, ED      Component Value Flag Ref Range Units Status   Sodium 141      135 - 145 mmol/L Final   Potassium 3.7      3.5 - 5.1 mmol/L Final   Chloride 103      98 - 111 mmol/L Final   BUN 5      8 - 23 mg/dL Final   Creatinine, Ser 0.70      0.44 - 1.00 mg/dL Final   Glucose, Bld 94      70 - 99 mg/dL Final   Comment:   Glucose reference range applies only to samples taken after fasting for at least 8 hours.   Calcium, Ion 1.15      1.15 - 1.40 mmol/L Final   TCO2 31      22 - 32 mmol/L Final   Hemoglobin 12.2      12.0 - 15.0 g/dL Final   HCT 36.0      36.0 - 46.0 % Final           CBG monitoring, ED      Component Value Flag Ref Range Units Status   Glucose-Capillary 106      70 - 99 mg/dL Final   Comment:   Glucose reference range applies only to samples taken after fasting for at least 8 hours.           OCT, Retina - OU - Both Eyes       Right Eye Quality was good. Central Foveal Thickness: 263. Progression has been stable. Findings include normal foveal contour, no IRF, no SRF, retinal drusen .   Left Eye Quality was good. Central Foveal Thickness: 237. Progression has worsened. Findings include no IRF, no  SRF, intraretinal hyper-reflective material, inner retinal atrophy, retinal drusen , abnormal foveal contour, outer retinal atrophy (Interval progression of inner retinal atrophy and inner retinal hyperreflectivity sup nasal macula).   Notes *Images captured and stored on drive  Diagnosis / Impression:  OD: NFP, no IRF/SRF OS: CRAO -- Interval progression of inner retinal atrophy and inner retinal hyperreflectivity sup nasal macula  Clinical management:  See below  Abbreviations: NFP - Normal foveal profile. CME - cystoid macular edema. PED - pigment epithelial detachment. IRF - intraretinal fluid. SRF - subretinal fluid. EZ - ellipsoid zone. ERM - epiretinal membrane. ORA - outer retinal atrophy. ORT - outer retinal tubulation. SRHM - subretinal hyper-reflective material. IRHM - intraretinal hyper-reflective material               ASSESSMENT/PLAN:    ICD-10-CM   1. Central retinal artery occlusion of  left eye  H34.12     2. Cerebrovascular accident (CVA), unspecified mechanism ([place])  I63.9     3. Retinal edema  H35.81 OCT, Retina - OU - Both Eyes    4. Essential hypertension  I10     5. Hypertensive retinopathy of both eyes  H35.033     6. Pseudophakia of both eyes  Z96.1      **Pt developed severe left sided upper and lower extremity weakness, facial droop, and slurred speech during clinic visit -- concern for stroke  - pt was ambulating slowly with walker during tech work up, which was completed around 24  - then pt underwent photography and had to use the restroom -- need the assistance of multiple people for each task due to severe weakness  - EMS called due to concern for stroke and pt transported to ED for evaluation  1,2. Remote CRAO OS  - decreased vision onset around mid-June  - BCVA HM from CF 3'  - exam show diffuse retinal atrophy and new area of retinal whitening sup nasal macula  - OCT shows progression of inner retinal atrophy and new areas of IRHM;  no CME OS  - FA (07.15.22) shows nonperfusion of inferior hemisphere with severely delayed filling of macula consistent with RAO  - discussed findings and stroke equivalency, guarded prognosis  - review of systems for GCA negative  - recommend optimization of cardiovascular risk factors w/ PCP  - recommend checking ESR, CRP and platelets for GCA r/o  - recommend carotid dopplers and echocardiogram to check for embolic sources  - will send letter to  PCP - f/u 3 months -- DFE/OCT  3,4. Hypertensive retinopathy OU - discussed importance of tight BP control - monitor   5. Pseudophakia OU  - s/p CE/IOL OU  - IOL in good position, doing well  - monitor    Ophthalmic Meds Ordered this visit:  No orders of the defined types were placed in this encounter.      Return in about 3 months (around 03/20/2021).  There are no Patient Instructions on file for this visit.   Explained the diagnoses, plan, and follow up with the patient and they expressed understanding.  Patient expressed understanding of the importance of proper follow up care.   This document serves as a record of services personally performed by Gardiner Sleeper, MD, PhD. It was created on their behalf by Leonie Douglas, an ophthalmic technician. The creation of this record is the provider's dictation and/or activities during the visit.    Electronically signed by: Leonie Douglas COA, 12/18/2020  12:39 PM  Gardiner Sleeper, M.D., Ph.D. Diseases & Surgery of the Retina and [place] 12/20/2020  I have reviewed the above documentation for accuracy and completeness, and I agree with the above. Gardiner Sleeper, M.D., Ph.D. 12/28/2020 12:39 PM  Abbreviations: M myopia (nearsighted); A astigmatism; H hyperopia (farsighted); P presbyopia; Mrx spectacle prescription;  CTL contact lenses; OD right eye; OS left eye; OU both eyes  XT exotropia; ET esotropia; PEK punctate epithelial keratitis; PEE punctate  epithelial erosions; DES dry eye syndrome; MGD meibomian gland dysfunction; ATs artificial tears; PFAT's preservative free artificial tears; [place] nuclear sclerotic cataract; PSC posterior subcapsular cataract; ERM epi-retinal membrane; PVD posterior vitreous detachment; RD retinal detachment; DM diabetes mellitus; DR diabetic retinopathy; NPDR non-proliferative diabetic retinopathy; PDR proliferative diabetic retinopathy; CSME clinically significant macular edema; DME diabetic macular edema; dbh dot blot hemorrhages; CWS cotton wool  spot; POAG primary open angle glaucoma; C/D cup-to-disc ratio; HVF humphrey visual field; GVF goldmann visual field; OCT optical coherence tomography; IOP intraocular pressure; BRVO Branch retinal vein occlusion; CRVO central retinal vein occlusion; CRAO central retinal artery occlusion; BRAO branch retinal artery occlusion; RT retinal tear; SB scleral buckle; PPV pars plana vitrectomy; VH Vitreous hemorrhage; PRP panretinal laser photocoagulation; IVK intravitreal kenalog; VMT vitreomacular traction; MH Macular hole;  NVD neovascularization of the disc; NVE neovascularization elsewhere; AREDS age related eye disease study; ARMD age related macular degeneration; POAG primary open angle glaucoma; EBMD epithelial/anterior basement membrane dystrophy; ACIOL anterior chamber intraocular lens; IOL intraocular lens; PCIOL posterior chamber intraocular lens; Phaco/IOL phacoemulsification with intraocular lens placement; [place] photorefractive keratectomy; LASIK laser assisted in situ keratomileusis; HTN hypertension; DM diabetes mellitus; COPD chronic obstructive pulmonary disease

## 2020-12-19 ENCOUNTER — Emergency Department (HOSPITAL_COMMUNITY): Payer: Medicare (Managed Care)

## 2020-12-19 ENCOUNTER — Inpatient Hospital Stay (HOSPITAL_COMMUNITY): Payer: Medicare (Managed Care)

## 2020-12-19 ENCOUNTER — Other Ambulatory Visit: Payer: Self-pay

## 2020-12-19 ENCOUNTER — Ambulatory Visit (INDEPENDENT_AMBULATORY_CARE_PROVIDER_SITE_OTHER): Payer: Medicare (Managed Care) | Admitting: Ophthalmology

## 2020-12-19 ENCOUNTER — Inpatient Hospital Stay (HOSPITAL_COMMUNITY)
Admission: EM | Admit: 2020-12-19 | Discharge: 2021-01-03 | DRG: 061 | Disposition: E | Payer: Medicare (Managed Care) | Attending: Neurology | Admitting: Neurology

## 2020-12-19 ENCOUNTER — Encounter (INDEPENDENT_AMBULATORY_CARE_PROVIDER_SITE_OTHER): Payer: Self-pay | Admitting: Ophthalmology

## 2020-12-19 DIAGNOSIS — I639 Cerebral infarction, unspecified: Secondary | ICD-10-CM | POA: Diagnosis present

## 2020-12-19 DIAGNOSIS — Z20822 Contact with and (suspected) exposure to covid-19: Secondary | ICD-10-CM | POA: Diagnosis present

## 2020-12-19 DIAGNOSIS — G936 Cerebral edema: Secondary | ICD-10-CM | POA: Diagnosis present

## 2020-12-19 DIAGNOSIS — G8194 Hemiplegia, unspecified affecting left nondominant side: Secondary | ICD-10-CM | POA: Diagnosis present

## 2020-12-19 DIAGNOSIS — C9590 Leukemia, unspecified not having achieved remission: Secondary | ICD-10-CM | POA: Diagnosis present

## 2020-12-19 DIAGNOSIS — Z800 Family history of malignant neoplasm of digestive organs: Secondary | ICD-10-CM

## 2020-12-19 DIAGNOSIS — R531 Weakness: Secondary | ICD-10-CM

## 2020-12-19 DIAGNOSIS — I10 Essential (primary) hypertension: Secondary | ICD-10-CM

## 2020-12-19 DIAGNOSIS — J45909 Unspecified asthma, uncomplicated: Secondary | ICD-10-CM | POA: Diagnosis present

## 2020-12-19 DIAGNOSIS — Z79899 Other long term (current) drug therapy: Secondary | ICD-10-CM

## 2020-12-19 DIAGNOSIS — Z515 Encounter for palliative care: Secondary | ICD-10-CM | POA: Diagnosis not present

## 2020-12-19 DIAGNOSIS — E870 Hyperosmolality and hypernatremia: Secondary | ICD-10-CM | POA: Diagnosis not present

## 2020-12-19 DIAGNOSIS — I63311 Cerebral infarction due to thrombosis of right middle cerebral artery: Secondary | ICD-10-CM

## 2020-12-19 DIAGNOSIS — I674 Hypertensive encephalopathy: Secondary | ICD-10-CM | POA: Diagnosis not present

## 2020-12-19 DIAGNOSIS — F0390 Unspecified dementia without behavioral disturbance: Secondary | ICD-10-CM | POA: Diagnosis present

## 2020-12-19 DIAGNOSIS — J9601 Acute respiratory failure with hypoxia: Secondary | ICD-10-CM | POA: Diagnosis present

## 2020-12-19 DIAGNOSIS — I619 Nontraumatic intracerebral hemorrhage, unspecified: Secondary | ICD-10-CM | POA: Diagnosis not present

## 2020-12-19 DIAGNOSIS — Z86718 Personal history of other venous thrombosis and embolism: Secondary | ICD-10-CM

## 2020-12-19 DIAGNOSIS — R29709 NIHSS score 9: Secondary | ICD-10-CM | POA: Diagnosis present

## 2020-12-19 DIAGNOSIS — D696 Thrombocytopenia, unspecified: Secondary | ICD-10-CM | POA: Diagnosis not present

## 2020-12-19 DIAGNOSIS — I6349 Cerebral infarction due to embolism of other cerebral artery: Principal | ICD-10-CM | POA: Diagnosis present

## 2020-12-19 DIAGNOSIS — H35033 Hypertensive retinopathy, bilateral: Secondary | ICD-10-CM

## 2020-12-19 DIAGNOSIS — Z961 Presence of intraocular lens: Secondary | ICD-10-CM

## 2020-12-19 DIAGNOSIS — D61818 Other pancytopenia: Secondary | ICD-10-CM | POA: Diagnosis present

## 2020-12-19 DIAGNOSIS — R29810 Facial weakness: Secondary | ICD-10-CM | POA: Diagnosis present

## 2020-12-19 DIAGNOSIS — T45615A Adverse effect of thrombolytic drugs, initial encounter: Secondary | ICD-10-CM | POA: Diagnosis not present

## 2020-12-19 DIAGNOSIS — Z9282 Status post administration of tPA (rtPA) in a different facility within the last 24 hours prior to admission to current facility: Secondary | ICD-10-CM | POA: Diagnosis not present

## 2020-12-19 DIAGNOSIS — I616 Nontraumatic intracerebral hemorrhage, multiple localized: Secondary | ICD-10-CM

## 2020-12-19 DIAGNOSIS — H3412 Central retinal artery occlusion, left eye: Secondary | ICD-10-CM

## 2020-12-19 DIAGNOSIS — G9349 Other encephalopathy: Secondary | ICD-10-CM | POA: Diagnosis present

## 2020-12-19 DIAGNOSIS — I615 Nontraumatic intracerebral hemorrhage, intraventricular: Secondary | ICD-10-CM | POA: Diagnosis not present

## 2020-12-19 DIAGNOSIS — G40909 Epilepsy, unspecified, not intractable, without status epilepticus: Secondary | ICD-10-CM | POA: Diagnosis present

## 2020-12-19 DIAGNOSIS — J9602 Acute respiratory failure with hypercapnia: Secondary | ICD-10-CM | POA: Diagnosis not present

## 2020-12-19 DIAGNOSIS — E876 Hypokalemia: Secondary | ICD-10-CM | POA: Diagnosis present

## 2020-12-19 DIAGNOSIS — G8929 Other chronic pain: Secondary | ICD-10-CM | POA: Diagnosis present

## 2020-12-19 DIAGNOSIS — Z9049 Acquired absence of other specified parts of digestive tract: Secondary | ICD-10-CM

## 2020-12-19 DIAGNOSIS — Z9221 Personal history of antineoplastic chemotherapy: Secondary | ICD-10-CM | POA: Diagnosis not present

## 2020-12-19 DIAGNOSIS — Z66 Do not resuscitate: Secondary | ICD-10-CM | POA: Diagnosis not present

## 2020-12-19 DIAGNOSIS — J9600 Acute respiratory failure, unspecified whether with hypoxia or hypercapnia: Secondary | ICD-10-CM

## 2020-12-19 DIAGNOSIS — E785 Hyperlipidemia, unspecified: Secondary | ICD-10-CM | POA: Diagnosis present

## 2020-12-19 DIAGNOSIS — I63413 Cerebral infarction due to embolism of bilateral middle cerebral arteries: Secondary | ICD-10-CM | POA: Diagnosis not present

## 2020-12-19 DIAGNOSIS — I161 Hypertensive emergency: Secondary | ICD-10-CM | POA: Diagnosis present

## 2020-12-19 DIAGNOSIS — Z85038 Personal history of other malignant neoplasm of large intestine: Secondary | ICD-10-CM | POA: Diagnosis not present

## 2020-12-19 DIAGNOSIS — L8990 Pressure ulcer of unspecified site, unspecified stage: Secondary | ICD-10-CM | POA: Insufficient documentation

## 2020-12-19 DIAGNOSIS — R4701 Aphasia: Secondary | ICD-10-CM | POA: Diagnosis present

## 2020-12-19 DIAGNOSIS — Z90710 Acquired absence of both cervix and uterus: Secondary | ICD-10-CM

## 2020-12-19 DIAGNOSIS — I6389 Other cerebral infarction: Secondary | ICD-10-CM | POA: Diagnosis not present

## 2020-12-19 DIAGNOSIS — Z7982 Long term (current) use of aspirin: Secondary | ICD-10-CM

## 2020-12-19 DIAGNOSIS — I610 Nontraumatic intracerebral hemorrhage in hemisphere, subcortical: Secondary | ICD-10-CM | POA: Diagnosis not present

## 2020-12-19 DIAGNOSIS — H3581 Retinal edema: Secondary | ICD-10-CM

## 2020-12-19 LAB — I-STAT ARTERIAL BLOOD GAS, ED
Acid-Base Excess: 4 mmol/L — ABNORMAL HIGH (ref 0.0–2.0)
Bicarbonate: 29.1 mmol/L — ABNORMAL HIGH (ref 20.0–28.0)
Calcium, Ion: 1.23 mmol/L (ref 1.15–1.40)
HCT: 38 % (ref 36.0–46.0)
Hemoglobin: 12.9 g/dL (ref 12.0–15.0)
O2 Saturation: 91 %
Potassium: 3 mmol/L — ABNORMAL LOW (ref 3.5–5.1)
Sodium: 141 mmol/L (ref 135–145)
TCO2: 30 mmol/L (ref 22–32)
pCO2 arterial: 46.4 mmHg (ref 32.0–48.0)
pH, Arterial: 7.405 (ref 7.350–7.450)
pO2, Arterial: 61 mmHg — ABNORMAL LOW (ref 83.0–108.0)

## 2020-12-19 LAB — COMPREHENSIVE METABOLIC PANEL
ALT: 8 U/L (ref 0–44)
AST: 16 U/L (ref 15–41)
Albumin: 2.9 g/dL — ABNORMAL LOW (ref 3.5–5.0)
Alkaline Phosphatase: 48 U/L (ref 38–126)
Anion gap: 3 — ABNORMAL LOW (ref 5–15)
BUN: 6 mg/dL — ABNORMAL LOW (ref 8–23)
CO2: 29 mmol/L (ref 22–32)
Calcium: 8.4 mg/dL — ABNORMAL LOW (ref 8.9–10.3)
Chloride: 106 mmol/L (ref 98–111)
Creatinine, Ser: 0.8 mg/dL (ref 0.44–1.00)
GFR, Estimated: >60 mL/min (ref >60)
Glucose, Bld: 94 mg/dL (ref 70–99)
Potassium: 3.5 mmol/L (ref 3.5–5.1)
Sodium: 138 mmol/L (ref 135–145)
Total Bilirubin: 0.4 mg/dL (ref 0.3–1.2)
Total Protein: 6 g/dL — ABNORMAL LOW (ref 6.5–8.1)

## 2020-12-19 LAB — I-STAT CHEM 8, ED
BUN: 5 mg/dL — ABNORMAL LOW (ref 8–23)
Calcium, Ion: 1.15 mmol/L (ref 1.15–1.40)
Chloride: 103 mmol/L (ref 98–111)
Creatinine, Ser: 0.7 mg/dL (ref 0.44–1.00)
Glucose, Bld: 94 mg/dL (ref 70–99)
HCT: 36 % (ref 36.0–46.0)
Hemoglobin: 12.2 g/dL (ref 12.0–15.0)
Potassium: 3.7 mmol/L (ref 3.5–5.1)
Sodium: 141 mmol/L (ref 135–145)
TCO2: 31 mmol/L (ref 22–32)

## 2020-12-19 LAB — DIFFERENTIAL
Abs Immature Granulocytes: 0 10*3/uL (ref 0.00–0.07)
Basophils Absolute: 0 10*3/uL (ref 0.0–0.1)
Basophils Relative: 1 %
Eosinophils Absolute: 0 10*3/uL (ref 0.0–0.5)
Eosinophils Relative: 0 %
Immature Granulocytes: 0 %
Lymphocytes Relative: 33 %
Lymphs Abs: 0.6 10*3/uL — ABNORMAL LOW (ref 0.7–4.0)
Monocytes Absolute: 0.2 10*3/uL (ref 0.1–1.0)
Monocytes Relative: 12 %
Neutro Abs: 1 10*3/uL — ABNORMAL LOW (ref 1.7–7.7)
Neutrophils Relative %: 54 %

## 2020-12-19 LAB — CBC
HCT: 36.9 % (ref 36.0–46.0)
HCT: 35.1 % — ABNORMAL LOW (ref 36.0–46.0)
Hemoglobin: 12 g/dL (ref 12.0–15.0)
Hemoglobin: 11.5 g/dL — ABNORMAL LOW (ref 12.0–15.0)
MCH: 32.2 pg (ref 26.0–34.0)
MCH: 31.7 pg (ref 26.0–34.0)
MCHC: 32.5 g/dL (ref 30.0–36.0)
MCHC: 32.8 g/dL (ref 30.0–36.0)
MCV: 98.9 fL (ref 80.0–100.0)
MCV: 96.7 fL (ref 80.0–100.0)
Platelets: 61 10*3/uL — ABNORMAL LOW (ref 150–400)
Platelets: 68 10*3/uL — ABNORMAL LOW (ref 150–400)
RBC: 3.73 MIL/uL — ABNORMAL LOW (ref 3.87–5.11)
RBC: 3.63 MIL/uL — ABNORMAL LOW (ref 3.87–5.11)
RDW: 12.6 % (ref 11.5–15.5)
RDW: 12.6 % (ref 11.5–15.5)
WBC: 1.9 10*3/uL — ABNORMAL LOW (ref 4.0–10.5)
WBC: 6.9 10*3/uL (ref 4.0–10.5)
nRBC: 0 % (ref 0.0–0.2)
nRBC: 0 % (ref 0.0–0.2)

## 2020-12-19 LAB — RESP PANEL BY RT-PCR (FLU A&B, COVID) ARPGX2
Influenza A by PCR: NEGATIVE
Influenza B by PCR: NEGATIVE
SARS Coronavirus 2 by RT PCR: NEGATIVE

## 2020-12-19 LAB — FIBRINOGEN: Fibrinogen: 415 mg/dL (ref 210–475)

## 2020-12-19 LAB — MRSA NEXT GEN BY PCR, NASAL: MRSA by PCR Next Gen: NOT DETECTED

## 2020-12-19 LAB — APTT: aPTT: 24 seconds (ref 24–36)

## 2020-12-19 LAB — SODIUM: Sodium: 141 mmol/L (ref 135–145)

## 2020-12-19 LAB — PROTIME-INR
INR: 1 (ref 0.8–1.2)
Prothrombin Time: 13.5 seconds (ref 11.4–15.2)

## 2020-12-19 LAB — CBG MONITORING, ED: Glucose-Capillary: 106 mg/dL — ABNORMAL HIGH (ref 70–99)

## 2020-12-19 MED ORDER — STROKE: EARLY STAGES OF RECOVERY BOOK
Freq: Once | Status: DC
Start: 2020-12-19 — End: 2020-12-24
  Filled 2020-12-19: qty 1

## 2020-12-19 MED ORDER — CLEVIDIPINE BUTYRATE 0.5 MG/ML IV EMUL
0.0000 mg/h | INTRAVENOUS | Status: DC
  Administered 2020-12-19: 1 mg/h via INTRAVENOUS
  Administered 2020-12-19: 13 mg/h via INTRAVENOUS
  Filled 2020-12-19: qty 50

## 2020-12-19 MED ORDER — ACETAMINOPHEN 325 MG PO TABS: 650.0000 mg | ORAL_TABLET | ORAL | Status: DC | PRN

## 2020-12-19 MED ORDER — PANTOPRAZOLE SODIUM 40 MG IV SOLR
40.0000 mg | Freq: Every day | INTRAVENOUS | Status: DC
Start: 2020-12-19 — End: 2020-12-24
  Administered 2020-12-19 – 2020-12-23 (×5): 40 mg via INTRAVENOUS
  Filled 2020-12-19 (×5): qty 40

## 2020-12-19 MED ORDER — IOHEXOL 350 MG/ML SOLN
65.0000 mL | Freq: Once | INTRAVENOUS | Status: AC | PRN
  Administered 2020-12-19: 65 mL via INTRAVENOUS

## 2020-12-19 MED ORDER — TRAMADOL HCL 50 MG PO TABS: 50.0000 mg | ORAL_TABLET | Freq: Two times a day (BID) | ORAL | Status: DC | PRN

## 2020-12-19 MED ORDER — ACETAMINOPHEN 650 MG RE SUPP: 650.0000 mg | RECTAL | Status: DC | PRN

## 2020-12-19 MED ORDER — SODIUM CHLORIDE 0.9 % IV SOLN
Freq: Once | INTRAVENOUS | Status: AC
Start: 2020-12-19 — End: 2020-12-20

## 2020-12-19 MED ORDER — FENTANYL CITRATE PF 50 MCG/ML IJ SOSY: 25.0000 ug | PREFILLED_SYRINGE | Freq: Once | INTRAMUSCULAR | Status: DC

## 2020-12-19 MED ORDER — PROPOFOL 1000 MG/100ML IV EMUL
0.0000 ug/kg/min | INTRAVENOUS | Status: DC
  Administered 2020-12-20 – 2020-12-21 (×2): 5 ug/kg/min via INTRAVENOUS
  Filled 2020-12-19 (×2): qty 100

## 2020-12-19 MED ORDER — FENTANYL BOLUS VIA INFUSION
25.0000 ug | INTRAVENOUS | Status: DC | PRN
  Administered 2020-12-19: 75 ug via INTRAVENOUS
  Administered 2020-12-19: 100 ug via INTRAVENOUS
  Administered 2020-12-20 – 2020-12-21 (×3): 50 ug via INTRAVENOUS
  Administered 2020-12-22: 100 ug via INTRAVENOUS
  Filled 2020-12-19: qty 100

## 2020-12-19 MED ORDER — MIDAZOLAM HCL 2 MG/2ML IJ SOLN: 1.0000 mg | Freq: Once | INTRAMUSCULAR | Status: DC

## 2020-12-19 MED ORDER — SODIUM CHLORIDE 0.9 % IV SOLN
750.0000 mg | Freq: Two times a day (BID) | INTRAVENOUS | Status: DC
  Administered 2020-12-19 – 2020-12-20 (×2): 750 mg via INTRAVENOUS
  Filled 2020-12-19 (×4): qty 7.5

## 2020-12-19 MED ORDER — MIDAZOLAM BOLUS VIA INFUSION
0.0000 mg | INTRAVENOUS | Status: DC | PRN
  Filled 2020-12-19: qty 5

## 2020-12-19 MED ORDER — SODIUM CHLORIDE 0.9% FLUSH
3.0000 mL | Freq: Once | INTRAVENOUS | Status: AC
  Administered 2020-12-19: 3 mL via INTRAVENOUS

## 2020-12-19 MED ORDER — TRANEXAMIC ACID-NACL 1000-0.7 MG/100ML-% IV SOLN
1000.0000 mg | INTRAVENOUS | Status: AC
  Administered 2020-12-19: 1000 mg via INTRAVENOUS
  Filled 2020-12-19: qty 100

## 2020-12-19 MED ORDER — ORAL CARE MOUTH RINSE
15.0000 mL | OROMUCOSAL | Status: DC
  Administered 2020-12-19 – 2020-12-24 (×46): 15 mL via OROMUCOSAL

## 2020-12-19 MED ORDER — LORAZEPAM 2 MG/ML IJ SOLN
INTRAMUSCULAR | Status: AC
  Filled 2020-12-19: qty 1

## 2020-12-19 MED ORDER — CLEVIDIPINE BUTYRATE 0.5 MG/ML IV EMUL
0.0000 mg/h | INTRAVENOUS | Status: DC
  Administered 2020-12-19: 15 mg/h via INTRAVENOUS
  Administered 2020-12-19: 21 mg/h via INTRAVENOUS
  Administered 2020-12-19: 15 mg/h via INTRAVENOUS
  Administered 2020-12-19: 10 mg/h via INTRAVENOUS
  Administered 2020-12-19: 15 mg/h via INTRAVENOUS
  Administered 2020-12-20: 21 mg/h via INTRAVENOUS
  Administered 2020-12-20: 11 mg/h via INTRAVENOUS
  Administered 2020-12-20: 21 mg/h via INTRAVENOUS
  Administered 2020-12-20: 12 mg/h via INTRAVENOUS
  Administered 2020-12-20: 16 mg/h via INTRAVENOUS
  Administered 2020-12-20 (×2): 21 mg/h via INTRAVENOUS
  Administered 2020-12-20: 18 mg/h via INTRAVENOUS
  Administered 2020-12-20: 21 mg/h via INTRAVENOUS
  Administered 2020-12-20: 16 mg/h via INTRAVENOUS
  Administered 2020-12-20: 12 mg/h via INTRAVENOUS
  Administered 2020-12-20: 21 mg/h via INTRAVENOUS
  Administered 2020-12-20 (×2): 20 mg/h via INTRAVENOUS
  Administered 2020-12-20: 16 mg/h via INTRAVENOUS
  Administered 2020-12-20: 21 mg/h via INTRAVENOUS
  Administered 2020-12-21 (×2): 9 mg/h via INTRAVENOUS
  Administered 2020-12-21: 10 mg/h via INTRAVENOUS
  Administered 2020-12-21 – 2020-12-22 (×2): 7 mg/h via INTRAVENOUS
  Administered 2020-12-22: 8 mg/h via INTRAVENOUS
  Administered 2020-12-22: 6 mg/h via INTRAVENOUS
  Filled 2020-12-19 (×2): qty 50
  Filled 2020-12-19 (×3): qty 100
  Filled 2020-12-19 (×7): qty 50
  Filled 2020-12-19 (×2): qty 100
  Filled 2020-12-19 (×9): qty 50
  Filled 2020-12-19 (×2): qty 100
  Filled 2020-12-19: qty 50

## 2020-12-19 MED ORDER — ACETAMINOPHEN 160 MG/5ML PO SOLN
650.0000 mg | ORAL | Status: DC | PRN
  Administered 2020-12-19 – 2020-12-22 (×5): 650 mg
  Filled 2020-12-19 (×5): qty 20.3

## 2020-12-19 MED ORDER — POLYETHYLENE GLYCOL 3350 17 G PO PACK
17.0000 g | PACK | Freq: Every day | ORAL | Status: DC
  Administered 2020-12-20 – 2020-12-23 (×4): 17 g
  Filled 2020-12-19 (×3): qty 1

## 2020-12-19 MED ORDER — DOCUSATE SODIUM 50 MG/5ML PO LIQD
100.0000 mg | Freq: Two times a day (BID) | ORAL | Status: DC
  Administered 2020-12-19 – 2020-12-23 (×9): 100 mg
  Filled 2020-12-19 (×9): qty 10

## 2020-12-19 MED ORDER — LORAZEPAM 2 MG/ML IJ SOLN: 0.5000 mg | Freq: Once | INTRAMUSCULAR | Status: DC

## 2020-12-19 MED ORDER — SENNOSIDES-DOCUSATE SODIUM 8.6-50 MG PO TABS
1.0000 | ORAL_TABLET | Freq: Two times a day (BID) | ORAL | Status: DC
Start: 2020-12-19 — End: 2020-12-20
  Filled 2020-12-19: qty 1

## 2020-12-19 MED ORDER — FENTANYL CITRATE PF 50 MCG/ML IJ SOSY: 50.0000 ug | PREFILLED_SYRINGE | Freq: Once | INTRAMUSCULAR | Status: DC

## 2020-12-19 MED ORDER — FENTANYL 2500MCG IN NS 250ML (10MCG/ML) PREMIX INFUSION
25.0000 ug/h | INTRAVENOUS | Status: DC
  Administered 2020-12-19: 50 ug/h via INTRAVENOUS
  Administered 2020-12-19 (×2): 25 ug/h via INTRAVENOUS
  Administered 2020-12-20 – 2020-12-22 (×2): 75 ug/h via INTRAVENOUS
  Administered 2020-12-24: 50 ug/h via INTRAVENOUS
  Filled 2020-12-19 (×4): qty 250

## 2020-12-19 MED ORDER — CHLORHEXIDINE GLUCONATE 0.12% ORAL RINSE (MEDLINE KIT)
15.0000 mL | Freq: Two times a day (BID) | OROMUCOSAL | Status: DC
  Administered 2020-12-19 – 2020-12-24 (×10): 15 mL via OROMUCOSAL

## 2020-12-19 MED ORDER — LABETALOL HCL 5 MG/ML IV SOLN
20.0000 mg | Freq: Once | INTRAVENOUS | Status: AC
  Administered 2020-12-19: 20 mg via INTRAVENOUS

## 2020-12-19 MED ORDER — SODIUM CHLORIDE 0.9% IV SOLUTION: Freq: Once | INTRAVENOUS | Status: DC

## 2020-12-19 MED ORDER — SODIUM CHLORIDE 3 % IV SOLN
INTRAVENOUS | Status: DC
Start: 2020-12-19 — End: 2020-12-21
  Administered 2020-12-20: 75 mL/h via INTRAVENOUS
  Filled 2020-12-19 (×7): qty 500

## 2020-12-19 MED ORDER — MIDAZOLAM-SODIUM CHLORIDE 100-0.9 MG/100ML-% IV SOLN
0.0000 mg/h | INTRAVENOUS | Status: DC
Start: 2020-12-19 — End: 2020-12-19
  Administered 2020-12-19: 1 mg/h via INTRAVENOUS
  Filled 2020-12-19: qty 100

## 2020-12-19 MED ORDER — TENECTEPLASE FOR STROKE
0.2500 mg/kg | PACK | Freq: Once | INTRAVENOUS | Status: AC
  Administered 2020-12-19: 16 mg via INTRAVENOUS

## 2020-12-19 NOTE — Progress Notes (Signed)
ED RN started the PIV. [place] [employer]

## 2020-12-19 NOTE — Progress Notes (Signed)
Patient arrived to 4N 17 from ED at 1706. Patient belongings at bedside: dentures upper and lower, pants, belt, 2 shirts, sweater, 2 shoes and a purse. Belongings will be given to patient's family.   Montez Hageman RN

## 2020-12-19 NOTE — Consult Note (Signed)
NAME:  [name], MRN:  [id number], DOB:  06/17/1945, LOS: 0 ADMISSION DATE:  12/23/2020, CONSULTATION DATE:  12/18/2020 REFERRING MD:  Lynnae Sandhoff MD , CHIEF COMPLAINT:   [place]  History of Present Illness:  42 Y?O with H/O colon cancer, DVT, HTN Admitted with acute stroke give lytics in ED, intubated for airway protection. 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   Significant Hospital Events: Including procedures, antibiotic start and stop dates in addition to other pertinent events   9/16 admitted for stroke.  Developed intracranial hemorrhage after lytics  Interim History / Subjective:    Objective   Blood pressure (!) 149/96, pulse 93, resp. rate (!) 24, height $RemoveBe'5\' 2"'BNJAmUiEb$  (1.575 m), weight 63.8 kg, SpO2 100 %.    Vent Mode: PRVC FiO2 (%):  [40 %] 40 % Set Rate:  [18 bmp] 18 bmp Vt Set:  [400 mL] 400 mL PEEP:  [5 cmH20] 5 cmH20 Plateau Pressure:  [16 cmH20] 16 cmH20   Intake/Output Summary (Last 24 hours) at 12/14/2020 1718 Last data filed at 12/07/2020 1510 Gross per 24 hour  Intake 0.59 ml  Output --  Net 0.59 ml   Filed Weights   12/11/2020 1200  Weight: 63.8 kg    Examination: Gen:      No acute distress, elderly female HEENT:  EOMI, sclera anicteric Neck:     No masses; no thyromegaly, ET tube Lungs:    Clear to auscultation bilaterally; normal respiratory effort CV:         Regular rate and rhythm; no murmurs Abd:      +  bowel sounds; soft, non-tender; no palpable masses, no distension Ext:    No edema; adequate peripheral perfusion Skin:      Warm and dry; no rash Neuro: Unresponsive, pupils minimally reactive  Lab/imaging reviewed Significant for low albumin of 2.9 WBC 1.9, platelets 61  CT head intracranial hematoma in the right thalamus, left lentiform with intraventricular extension with mass-effect  Resolved Hospital Problem list    Assessment & Plan:  [place] ICH afer TPA Starting 3% Na with goal Na 145-155 Follow Na q6 Cleveprex for SBP < 140 [place] Neurosurgery consulted by primary team MRI when stable  Acute resp failure, failure to protect airway due to stoke Asthma Follow CXR, ABG Nebs PRN  Leukemia, thrombocytopenia This is a chronic issue with normal bone marrow biopsy in 2019.  Exacerbated by critical illness Follow CBC. Consider platelet transfusion if count drops <50K  Best Practice (right click and "Reselect all SmartList Selections" daily)   Diet/type: NPO DVT prophylaxis: not indicated GI prophylaxis: PPI Lines: Central line Foley:  Yes, and it is still needed Code Status:  full code Last date of multidisciplinary goals of care discussion $RemoveBeforeD'[]'JnXemDFwixLgxg$ . Daughter updated at bedside 9/16  Labs   CBC: Recent Labs  Lab 12/28/2020 1229 12/31/2020 1329 12/06/2020 1601  WBC  --  1.9*  --   NEUTROABS  --  1.0*  --   HGB 12.2 12.0 12.9  HCT 36.0 36.9 38.0  MCV  --  98.9  --   PLT  --  61*  --     Basic Metabolic Panel: Recent Labs  Lab 12/26/2020 1229 12/16/2020 1329 12/16/2020 1601  NA 141 138 141  K 3.7 3.5 3.0*  CL 103 106  --   CO2  --  29  --   GLUCOSE 94 94  --   BUN 5* 6*  --   CREATININE 0.70 0.80  --   CALCIUM  --  8.4*  --    GFR: Estimated Creatinine Clearance: 51.7 mL/min (by C-G formula based on SCr of 0.8 mg/dL). Recent Labs  Lab 12/29/2020 1329  WBC 1.9*    Liver Function Tests: Recent Labs  Lab 12/18/2020 1329  AST 16  ALT 8  ALKPHOS 48  BILITOT 0.4   PROT 6.0*  ALBUMIN 2.9*   No results for input(s): LIPASE, AMYLASE in the last 168 hours. No results for input(s): AMMONIA in the last 168 hours.  ABG    Component Value Date/Time   PHART 7.405 12/26/2020 1601   PCO2ART 46.4 12/10/2020 1601   PO2ART 61 (L) 12/26/2020 1601   HCO3 29.1 (H) 12/06/2020 1601   TCO2 30 12/16/2020 1601   O2SAT 91.0 12/06/2020 1601     Coagulation Profile: Recent Labs  Lab 12/17/2020 1329  INR 1.0    Cardiac Enzymes: No results for input(s): CKTOTAL, CKMB, CKMBINDEX, TROPONINI in the last 168 hours.  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   Surgical History:   Past Surgical History:  Procedure Laterality Date   ABDOMINAL HYSTERECTOMY  2012   CATARACT EXTRACTION     COLON SURGERY     COLONOSCOPY     EYE SURGERY     PORTACATH PLACEMENT     portacath removal     RADIOFREQUENCY ABLATION LIVER TUMOR     SMALL INTESTINE SURGERY       Social History:   reports that she has never smoked. She has never used smokeless tobacco. She reports that she does not drink alcohol and does not use drugs.   Family History:  Her family history includes Breast cancer in her sister; Cancer in her brother and daughter; Hypertension in her father.   Allergies No Known Allergies   Home Medications  Prior to Admission medications   Medication Sig Start Date End Date Taking? Authorizing Provider  amoxicillin (AMOXIL) 500 MG capsule Take 1 capsule (500 mg total) by  mouth 3 (three) times daily. 01/27/18   Fransico Meadow, PA-C  cloNIDine (CATAPRES) 0.1 MG tablet Take 0.1 mg by mouth 2 (two) times daily.     [provider]  diclofenac sodium (VOLTAREN) 1 % GEL Apply 2 g topically 4 (four) times daily. 06/12/17   Tasia Catchings, Amy V, PA-C  escitalopram (LEXAPRO) 10 MG tablet Take 10 mg by mouth daily. 01/01/17   [provider]  Fluticasone-Salmeterol (ADVAIR) 100-50 MCG/DOSE AEPB Inhale 2 puffs into the lungs 2 (two) times daily as needed (wheezing).     [provider]  gabapentin (NEURONTIN) 300  MG capsule Take 600 mg by mouth 3 (three) times daily. 01/18/17   [provider]  levETIRAcetam (KEPPRA) 500 MG tablet Take 1 tablet (500 mg total) by mouth 2 (two) times daily. 09/29/17   Marcial Pacas, MD  lidocaine (LIDODERM) 5 % Place 1 patch onto the skin daily. Apply at 8 am and remove at 8 pm 01/06/17   [provider]  losartan-hydrochlorothiazide (HYZAAR) 100-12.5 MG tablet Take 1 tablet by mouth daily.    [provider]  meclizine (ANTIVERT) 12.5 MG tablet Take 1 tablet (12.5 mg total) by mouth 3 (three) times daily as needed for dizziness. 01/22/17   Domenic Moras, PA-C  memantine (NAMENDA) 10 MG tablet Take 1 tablet (10 mg total) by mouth 2 (two) times daily. 09/29/17   Marcial Pacas, MD  Multiple Vitamins-Minerals (CENTRUM SILVER ADULT 50+ PO) Take 1 tablet by mouth daily.    [provider]  NUCYNTA ER 50 MG 12 hr tablet Limit 1 tablet by mouth every 12 hours tolerated.NO HYDROCODONE/ACETAMINOPHEN 09/20/17   [provider]  polyethylene glycol (MIRALAX / GLYCOLAX) packet Take 17 g by mouth daily as needed for mild constipation.    [provider]  potassium chloride SA (K-DUR,KLOR-CON) 20 MEQ tablet Take 20 mEq by mouth daily.     [provider]  TAZTIA XT 180 MG 24 hr capsule Take by mouth daily. 06/08/17   [provider]     Critical care time:    The patient is critically  ill with multiple organ system failure and requires high complexity decision making for assessment and support, frequent evaluation and titration of therapies, advanced monitoring, review of radiographic studies and interpretation of complex data.   Critical Care Time devoted to patient care services, exclusive of separately billable procedures, described in this note is 45 minutes.   Marshell Garfinkel MD [facility] See Amion for pager  If no response to pager , please call [phone] until 7pm After 7:00 pm call Elink  [phone] 12/07/2020, 6:08 PM

## 2020-12-19 NOTE — Progress Notes (Signed)
eLink Physician-Brief Progress Note Patient Name: [name] DOB: 21-Feb-1946 MRN: [id number]   Date of Service  12/14/2020  HPI/Events of Note  CXR at 1833hrs notes ETT, OGT, and central venous catheter to all be in appropriate position  eICU Interventions  Central venous catheter and OGT safe to use     Intervention Category Intermediate Interventions: Diagnostic test evaluation  [place] Dome 12/05/2020, 7:20 PM

## 2020-12-19 NOTE — H&P (Addendum)
Neurology H&P  [name] MR# [id number] 12/22/2020   CC: left sided weakness  History is obtained from: EMS, chart and patient  HPI: [name] is a 75 y.o. female PMHx as reviewed below went to her ophthalmologist for follow up exam and developed severe left sided weakness, facial droop, and slurred speech around 1030. EMS arrived and found CBG 96 and BP 230/120.  The patient was mildly encephalopathic with speech difficulty and history was limited.  LKW: 1030 tNK given: No yes IR Thrombectomy No, lvo Modified Rankin Scale: 1-No significant post stroke disability and can perform usual duties with stroke symptoms NIHSS: 9  LOC Responsiveness 0 LOC Questions 0 LOC Commands 0 Horizontal eye movement 0 Visual field 0 Facial palsy 1 Motor arm - Right arm 0 Motor arm - Left arm 3 Motor leg - Right leg 0 Motor leg - Left leg 3 Limb ataxia 0 Sensory test 0 Language 2 Speech 0 Extinction and inattention 0  ROS: Unable to assess due to encephalopathy.  Past Medical History:  Diagnosis Date   Anemia    Arthritis    Asthma    Clotting disorder ([place])    Colon cancer ([place])    DVT (deep venous thrombosis) (HCC)    right jugular   Essential hypertension 02/20/2016   Hypertension    Hypertensive retinopathy    met colon ca to liver dx'd 02/25/09   chemo comp 12/19/09   PONV (postoperative nausea and vomiting)    Seizures (HCC)    Shingles rash    Spinal headache     Family History  Problem Relation Age of Onset   Hypertension Father    Cancer Brother        colon   Cancer Daughter        colon   Breast cancer Sister     Social History:  reports that she has never smoked. She has never used smokeless tobacco. She reports that she does not drink alcohol and does not use drugs.   Prior to Admission medications   Medication Sig Start Date End Date Taking? Authorizing Provider  amoxicillin (AMOXIL) 500 MG capsule Take 1 capsule (500 mg total) by  mouth 3 (three) times daily. 01/27/18   Fransico Meadow, PA-C  cloNIDine (CATAPRES) 0.1 MG tablet Take 0.1 mg by mouth 2 (two) times daily.     [provider]  diclofenac sodium (VOLTAREN) 1 % GEL Apply 2 g topically 4 (four) times daily. 06/12/17   Tasia Catchings, Amy V, PA-C  escitalopram (LEXAPRO) 10 MG tablet Take 10 mg by mouth daily. 01/01/17   [provider]  Fluticasone-Salmeterol (ADVAIR) 100-50 MCG/DOSE AEPB Inhale 2 puffs into the lungs 2 (two) times daily as needed (wheezing).     [provider]  gabapentin (NEURONTIN) 300 MG capsule Take 600 mg by mouth 3 (three) times daily. 01/18/17   [provider]  levETIRAcetam (KEPPRA) 500 MG tablet Take 1 tablet (500 mg total) by mouth 2 (two) times daily. 09/29/17   Marcial Pacas, MD  lidocaine (LIDODERM) 5 % Place 1 patch onto the skin daily. Apply at 8 am and remove at 8 pm 01/06/17   [provider]  losartan-hydrochlorothiazide (HYZAAR) 100-12.5 MG tablet Take 1 tablet by mouth daily.    [provider]  meclizine (ANTIVERT) 12.5 MG tablet Take 1 tablet (12.5 mg total) by mouth 3 (three) times daily as needed for dizziness. 01/22/17   Domenic Moras, PA-C  memantine [facility]) 10  MG tablet Take 1 tablet (10 mg total) by mouth 2 (two) times daily. 09/29/17   Marcial Pacas, MD  Multiple Vitamins-Minerals (CENTRUM SILVER ADULT 50+ PO) Take 1 tablet by mouth daily.    [provider]  NUCYNTA ER 50 MG 12 hr tablet Limit 1 tablet by mouth every 12 hours tolerated.NO HYDROCODONE/ACETAMINOPHEN 09/20/17   [provider]  polyethylene glycol (MIRALAX / GLYCOLAX) packet Take 17 g by mouth daily as needed for mild constipation.    [provider]  potassium chloride SA (K-DUR,KLOR-CON) 20 MEQ tablet Take 20 mEq by mouth daily.     [provider]  TAZTIA XT 180 MG 24 hr capsule Take by mouth daily. 06/08/17   [provider]    Exam: Current vital signs: BP (!) 191/80   Pulse  82   Wt 63.8 kg   SpO2 (P) 100%   BMI 25.73 kg/m   Physical Exam  Constitutional: Appears well-developed and well-nourished.  Psych: Affect appropriate to situation Eyes: No scleral injection HENT: No OP obstruction. Head: Normocephalic.  Cardiovascular: Normal rate and regular rhythm.  Respiratory: Effort normal, symmetric excursions bilaterally, no audible wheezing. GI: Soft.  No distension. There is no tenderness.  Skin: WDI  Neuro: Mental Status: Patient is awake, oriented to person. Patient is not able to give a clear and coherent history. Speech impaired fluency, intact comprehension and impaired repetition. No signs of neglect. Visual Fields are full. Pupils are equal, round, and reactive to light. EOMI without ptosis or diploplia.  Facial sensation is symmetric to temperature Facial movement is symmetric.  Hearing is intact to voice. Uvula midline and palate elevates symmetrically. Shoulder shrug is symmetric. Tongue is midline without atrophy or fasciculations.  Tone is normal. Bulk is normal. Left upper and lower extremities ~3/5. Sensation is symmetric to light touch and temperature in the arms and legs. Deep Tendon Reflexes: 2+ and symmetric in the biceps and patellae. Toes Babinski (+)L FNF and HKS too weak to perform in left extremities. Gait - Deferred  I have reviewed labs in epic and the pertinent results are:  Ref. Range 12/05/2020   Glucose Ref 70 - 99 mg/dL 94    I have reviewed the images obtained: NCT head showed no acute intracranial hemorrhage or ischemic changes. 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  Follow up NCT head new intraparenchymal hematomas in right thalamus (1.8 cm AP x 2.6 cm TV by 1.8 cm cc) as well as intraparenchymal hematoma centered in left lentiform nucleus  measuring (3.3 cm AP x 2.3 cm TV by 2.7 cm cc). There is mass effect from right thalamic hemorrhage distorts the third ventricle without evidence of upstream hydrocephalus. There is intraventricular extension of hemorrhage into the lateral, third, and fourth ventricles with blood casting the third ventricle, aqueduct, and fourth ventricle.  Assessment: [name] is a 75 y.o. female PMHx as above with acute onset left sided weakness and aphasia in hypertensive emergency. The patient was deemed a candidate for tNK however there was a delay in thrombolysis due to very difficult IV access and her SBPs>200. IV access was eventually achieved and labetalol 56m IV was given and blood pressures were acceptable for thrombolysis which was then administered.   The patient was then taken back to her room and her exam had improved.  Repeat  exam '@1400'  found to have new left sided gaze and was unresponsive. She received  ativan for seizure and STAT NCT head showed bilateral intraparenchymal hematomas in right thalamus and left lentiform nucleus with intraventricular extension.   E(3) V(4) M(6) 13 ICH 2  Patient was intubated for airway protection and type & cross and TXA/cryo administered.  Plan: Elevate head of bed keep head midline. Start clevidipine drip for goal SBP<140. Start 3% Na infusion - Goal serum Na 145-155. Serum Na every 6 hours. X-ray chest. Admit to neuro ICU. Consult [facility] for ventilator management. Consult neurosurgery. Sedation, analgesics (fentanyl). Keppra 741m IV bid. Hold antiplatelets and anticoagulation for now. IV fluids gentle hydration. Repeat CT head in 6 hours (or sooner if clinical worsening). Echocardiogram. MRI brain without contrast when stable. MRA/CTA head and neck when stable. Keep platelets >100k, INR<1.4 Replete electrolytes as needed. Labs: Coags, CBC, type and cross, CMP, Mg, Phos, fasting lipids, hA1c, hCRP, troponins, urinalysis. Normothermia -  Acetaminophen for temperature >37.5C Euglycemia (~ <180) Euvolemia - Strict I/Os Precautions: Airway/herniation, seizure, aspiration. PPx: SCDs for now, Senna/docusate, PPI.  This patient is critically ill and at significant risk of neurological worsening, death and care requires constant monitoring of vital signs, hemodynamics,respiratory and cardiac monitoring, neurological assessment, discussion with family, other specialists and medical decision making of high complexity. I spent 125 minutes of neurocritical care time  in the care of  this patient. This was time spent independent of any time provided by nurse practitioner or PA.  Electronically signed by:  HLynnae Sandhoff MD Page: [id number]/16/2022, 1:41 PM

## 2020-12-19 NOTE — Progress Notes (Signed)
PHARMACIST CODE STROKE RESPONSE  Notified to mix TNK at 1308 by Dr. Theda Sers Delivered TNK to RN at 1309  TNK dose = 16 mg IV over 5 seconds  Issues/delays encountered (if applicable): waiting on IV access initially to obtain BP control prior to administering thrombolytics.  Joetta Manners, PharmD, [facility] Clinical Pharmacist ED RPh Phone: [place]: [phone]

## 2020-12-19 NOTE — Procedures (Signed)
Central Venous Catheter Insertion Procedure Note  [name]  DM:[id number]  [birth year]  Date:12/04/2020  Time:6:37 PM   Provider Performing:Ahnyla Mendel D. Harris   Procedure: Insertion of Non-tunneled Central Venous [phone]) with US guidance [id number])   Indication(s) Medication administration and Difficult access  Consent Risks of the procedure as well as the alternatives and risks of each were explained to the patient and/or caregiver.  Consent for the procedure was obtained and is signed in the bedside chart  Anesthesia Topical only with 1% lidocaine   Timeout Verified patient identification, verified procedure, site/side was marked, verified correct patient position, special equipment/implants available, medications/allergies/relevant history reviewed, required imaging and test results available.  Sterile Technique Maximal sterile technique including full sterile barrier drape, hand hygiene, sterile gown, sterile gloves, mask, hair covering, sterile ultrasound probe cover (if used).  Procedure Description Area of catheter insertion was cleaned with chlorhexidine and draped in sterile fashion.  With real-time ultrasound guidance a central venous catheter was placed into the left internal jugular vein. Nonpulsatile blood flow and easy flushing noted in all ports.  The catheter was sutured in place and sterile dressing applied.  Complications/Tolerance None; patient tolerated the procedure well. Chest X-ray is ordered to verify placement for internal jugular or subclavian cannulation.   Chest x-ray is not ordered for femoral cannulation.  EBL Minimal  Specimen(s) None   Dessire Grimes D. Kenton Kingfisher, NP-C [facility] Personal contact information can be found on Amion  12/29/2020, 6:37 PM

## 2020-12-19 NOTE — Progress Notes (Signed)
Pt arrived [place]. CCM paged forline.  RN to Blood bank for cryo.

## 2020-12-19 NOTE — Progress Notes (Signed)
Pt transported to CT and back to [id number] w/o complication. RT will cont to monitor.

## 2020-12-19 NOTE — Code Documentation (Addendum)
At 1400 check, pt found to have new left sided gaze and unresponsive. MD H. Collins paged to bedside. Order received for stat CT and ativan, see MAR. CT obtained when table available. Pt returned to CT and intubated for airway protection per EDP. TXA/cryo ordered. IV access gained through ultrasound by MD Pearline Cables. Daughter updated at bedside.   SBP goal <140.

## 2020-12-19 NOTE — Progress Notes (Signed)
Called re repeat CT. Pt admitted after stroke presenting with left hemiparesis. Given tnk at ~1300 and developed bilateral thalamic/ganglionic hemorrhage with IVH. Repeat CTH demonstrates stability of IPH with redistribution of IVH, no significant ventriculomegaly.   Pt has very poor exam currently, recently administered thrombolytic and subsequent cryoprecipitate, and no real ventriculomegaly on CT. Would not place EVD currently. Plan on repeat CTH in am, could consider EVD if she develops hydrocephalus although given her age, exam, and bilateral thalamic/ganglionic hemorrhages suspect her overall prognosis is poor.   Consuella Lose, MD [facility]

## 2020-12-19 NOTE — Progress Notes (Signed)
Second bag of cryo unit # [id number] checked with Percell Locus RN and hung.

## 2020-12-19 NOTE — ED Provider Notes (Addendum)
  Procedure Name: Intubation Date/Time: 12/13/2020 3:55 PM Performed by: Lianne Cure, DO Pre-anesthesia Checklist: Patient identified Oxygen Delivery Method: Nasal cannula Preoxygenation: Pre-oxygenation with 100% oxygen Laryngoscope Size: Glidescope Tube size: 8.0 mm Number of attempts: 1 Placement Confirmation: ETT inserted through vocal cords under direct vision, Positive ETCO2, CO2 detector and Breath sounds checked- equal and bilateral Tube secured with: ETT holder    Ultrasound ED Peripheral IV (Provider)  Date/Time: 12/10/2020 3:56 PM Performed by: Lianne Cure, DO Authorized by: Lianne Cure, DO   Procedure details:    Indications: multiple failed IV attempts and poor IV access     Location: left basilic.   Angiocath:  18 G   Bedside Ultrasound Guided: Yes     Patient tolerated procedure without complications: Yes     Dressing applied: Yes   .Critical Care Performed by: Lianne Cure, DO Authorized by: Lianne Cure, DO   Critical care provider statement:    Critical care time (minutes):  34   Critical care was time spent personally by me on the following activities:  Discussions with consultants, evaluation of patient's response to treatment, examination of patient, ordering and performing treatments and interventions, ordering and review of laboratory studies, ordering and review of radiographic studies, pulse oximetry, re-evaluation of patient's condition, obtaining history from patient or surrogate and review of old charts    Lianne Cure, DO [id number]    Lianne Cure, DO [id number]

## 2020-12-19 NOTE — ED Provider Notes (Signed)
[facility] EMERGENCY DEPARTMENT Provider Note   CSN: [id number] Arrival date & time: 12/28/2020  1220  An emergency department physician performed an initial assessment on this suspected stroke patient at 1221.  History Chief Complaint  Patient presents with   Code Stroke    [name] is a 75 y.o. female with a past medical history significant for colon cancer, history of DVT, clotting disorder, metastatic colon cancer, hypertension, and seizures who presents to the ED as a code stroke. Per EMS, patient had sudden onset of severe left-sided weakness and slurred speech just prior to arrival. No previous CVA. LKW around 10:30AM. Patient found to be severely hypertensive at 230/120 upon EMS arrival.   Level 5 caveat secondary to acuity of condition.  History obtained from patient, daughter, and past medical records. No interpreter used during encounter.       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   Mild cognitive impairment 09/29/2017   Essential hypertension 02/20/2016   Small bowel obstruction ([place]) 02/13/2016   Seizures ([place]) 05/08/2015   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     Family History  Problem Relation Age of Onset   Hypertension Father    Cancer Brother        colon   Cancer Daughter        colon   Breast cancer Sister     Social History   Tobacco Use   Smoking status: Never   Smokeless tobacco: Never  Substance Use Topics   Alcohol use: No   Drug use: No    Home Medications Prior to Admission medications   Medication Sig Start Date End Date Taking? Authorizing Provider  amoxicillin (AMOXIL) 500 MG capsule Take 1 capsule (500 mg total) by mouth 3 (three) times daily. 01/27/18   Fransico Meadow, PA-C  cloNIDine (CATAPRES) 0.1 MG tablet Take 0.1 mg by mouth 2 (two) times daily.     [provider]  diclofenac sodium (VOLTAREN) 1 % GEL Apply 2 g topically 4 (four) times daily. 06/12/17   Tasia Catchings, Amy V, PA-C  escitalopram (LEXAPRO) 10 MG tablet Take 10 mg by mouth daily. 01/01/17   [provider]  Fluticasone-Salmeterol (ADVAIR) 100-50 MCG/DOSE AEPB Inhale 2  puffs into the lungs 2 (two) times daily as needed (wheezing).     [provider]  gabapentin (NEURONTIN) 300 MG capsule Take 600 mg by mouth 3 (three) times daily. 01/18/17   [provider]  levETIRAcetam (KEPPRA) 500 MG tablet Take 1 tablet (500 mg total) by mouth 2 (two) times daily. 09/29/17   Marcial Pacas, MD  lidocaine (LIDODERM) 5 % Place 1 patch onto the skin daily. Apply at 8 am and remove at 8 pm 01/06/17   [provider]  losartan-hydrochlorothiazide (HYZAAR) 100-12.5 MG tablet Take 1 tablet by mouth daily.    [provider]  meclizine (ANTIVERT) 12.5 MG tablet Take 1 tablet (12.5 mg total) by mouth 3 (three) times daily as needed for dizziness. 01/22/17   Domenic Moras, PA-C  memantine (NAMENDA) 10 MG tablet Take 1  tablet (10 mg total) by mouth 2 (two) times daily. 09/29/17   Marcial Pacas, MD  Multiple Vitamins-Minerals (CENTRUM SILVER ADULT 50+ PO) Take 1 tablet by mouth daily.    [provider]  NUCYNTA ER 50 MG 12 hr tablet Limit 1 tablet by mouth every 12 hours tolerated.NO HYDROCODONE/ACETAMINOPHEN 09/20/17   [provider]  polyethylene glycol (MIRALAX / GLYCOLAX) packet Take 17 g by mouth daily as needed for mild constipation.    [provider]  potassium chloride SA (K-DUR,KLOR-CON) 20 MEQ tablet Take 20 mEq by mouth daily.     [provider]  TAZTIA XT 180 MG 24 hr capsule Take by mouth daily. 06/08/17   [provider]    Allergies    Patient has no known allergies.  Review of Systems   Review of Systems  Unable to perform ROS: Acuity of condition   Physical Exam Updated Vital Signs BP 127/67   Pulse 81   Resp 18   Ht _0  (1.575 m)   Wt 63.8 kg   SpO2 92%   BMI 25.73 kg/m   Physical Exam Vitals and nursing note reviewed.  Constitutional:      General: She is not in acute distress.    Appearance: She is not ill-appearing.  HENT:     Head: Normocephalic.  Eyes:     Pupils: Pupils are equal, round, and reactive to light.  Cardiovascular:     Rate and Rhythm: Normal rate and regular rhythm.     Pulses: Normal pulses.     Heart sounds: Normal heart sounds. No murmur heard.   No friction rub. No gallop.  Pulmonary:     Effort: Pulmonary effort is normal.     Breath sounds: Normal breath sounds.  Abdominal:     General: Abdomen is flat. There is no distension.     Palpations: Abdomen is soft.     Tenderness: There is no abdominal tenderness. There is no guarding or rebound.  Musculoskeletal:        General: Normal range of motion.     Cervical back: Neck supple.  Skin:    General: Skin is warm and dry.  Neurological:     Mental Status: She is alert.     Comments: Left facial droop Aphasia Left-sided weakness  Psychiatric:         Mood and Affect: Mood normal.        Behavior: Behavior normal.    ED Results / Procedures / Treatments   Labs (all labs ordered are listed, but only abnormal results are displayed) Labs Reviewed  CBC - Abnormal; Notable for the following  components:      Result Value   WBC 1.9 (*)    RBC 3.73 (*)    Platelets 61 (*)    All other components within normal limits  DIFFERENTIAL - Abnormal; Notable for the following components:   Neutro Abs 1.0 (*)    Lymphs Abs 0.6 (*)    All other components within normal limits  COMPREHENSIVE METABOLIC PANEL - Abnormal; Notable for the following components:   BUN 6 (*)    Calcium 8.4 (*)    Total Protein 6.0 (*)    Albumin 2.9 (*)    Anion gap 3 (*)    All other components within normal limits  I-STAT CHEM 8, ED - Abnormal; Notable for the following components:   BUN 5 (*)    All other components within normal limits  CBG MONITORING, ED - Abnormal; Notable for the following components:   Glucose-Capillary 106 (*)    All other components within normal limits  RESP PANEL BY RT-PCR (FLU A&B, COVID) ARPGX2  PROTIME-INR  APTT  LEVETIRACETAM LEVEL  FIBRINOGEN  SODIUM  SODIUM  SODIUM  MAGNESIUM  PHOSPHORUS  TYPE AND SCREEN  PREPARE CRYOPRECIPITATE    EKG None  Radiology OCT, Retina - OU - Both Eyes  Result Date: 12/14/2020 Right Eye Quality was good. Central Foveal Thickness: 263. Progression has been stable. Findings include normal foveal contour, no IRF, no SRF, retinal drusen . Left Eye Quality was good. Central Foveal Thickness: 237. Progression has worsened. Findings include no IRF, no SRF, intraretinal hyper-reflective material, inner retinal atrophy, retinal drusen , abnormal foveal contour, outer retinal atrophy (Interval progression of inner retinal atrophy and inner retinal hyperreflectivity sup nasal macula). Notes *Images captured and stored on drive Diagnosis / Impression: OD: NFP, no IRF/SRF OS: CRAO -- Interval  progression of inner retinal atrophy and inner retinal hyperreflectivity sup nasal macula Clinical management: See below Abbreviations: NFP - Normal foveal profile. CME - cystoid macular edema. PED - pigment epithelial detachment. IRF - intraretinal fluid. SRF - subretinal fluid. EZ - ellipsoid zone. ERM - epiretinal membrane. ORA - outer retinal atrophy. ORT - outer retinal tubulation. SRHM - subretinal hyper-reflective material. IRHM - intraretinal hyper-reflective material   CT HEAD CODE STROKE WO CONTRAST  Result Date: 12/18/2020 CLINICAL DATA:  Code stroke. EXAM: CT HEAD WITHOUT CONTRAST TECHNIQUE: Contiguous axial images were obtained from the base of the skull through the vertex without intravenous contrast. COMPARISON:  Same day noncontrast head CT FINDINGS: Brain: There is a new intraparenchymal hematoma in the right thalamus measuring 1.8 cm AP x 2.6 cm TV by 1.8 cm cc. There is an additional new intraparenchymal hematoma centered in the left lentiform nucleus measuring 3.3 cm AP x 2.3 cm TV by 2.7 cm cc. Mass effect from the right thalamic hemorrhage distorts the third ventricle without evidence of upstream hydrocephalus. There is intraventricular extension of hemorrhage into the lateral, third, and fourth ventricles with blood casting the third ventricle, aqueduct, and fourth ventricle. There is no midline shift. The basal cisterns are patent. Confluent hypodensity throughout the supratentorial white matter is unchanged. Vascular: There is calcification of the bilateral cavernous ICAs. Skull: Normal. Negative for fracture or focal lesion. Sinuses/Orbits: No acute finding. Other: None. IMPRESSION: New acute intracranial hematomas in the right thalamus and left lentiform nucleus with intraventricular extension as above. Mass effect from the right thalamic hemorrhage distorts the third ventricle without evidence of upstream hydrocephalus. Critical Value/emergent results were called by telephone at the  time  of interpretation on 12/04/2020 at 2:35 pm to provider [facility] , who verbally acknowledged these results. Electronically Signed   By: Valetta Mole M.D.   On: 12/05/2020 14:40   CT HEAD CODE STROKE WO CONTRAST  Result Date: 12/11/2020 CLINICAL DATA:  Code stroke. EXAM: CT HEAD WITHOUT CONTRAST TECHNIQUE: Contiguous axial images were obtained from the base of the skull through the vertex without intravenous contrast. COMPARISON:  Brain MRI 06/19/2015, CT head 05/05/2015 FINDINGS: Brain: There is no evidence of acute intracranial hemorrhage, extra-axial fluid collection, or acute infarct. There is extensive hypodensity throughout the subcortical and periventricular white matter likely reflecting sequela of advanced chronic white matter microangiopathy. There is no mass lesion. There is no midline shift. The ventricles are stable in size. Vascular: There is calcification of the bilateral cavernous ICAs and vertebral arteries. Skull: Normal. Negative for fracture or focal lesion. Sinuses/Orbits: There is mucosal thickening in the left sphenoid sinus. Bilateral lens implants are in place. The globes and orbits are otherwise unremarkable. Other: There are bilateral mastoid effusions. ASPECTS [facility] Stroke Program Early CT Score) - Ganglionic level infarction (caudate, lentiform nuclei, internal capsule, insula, M1-M3 cortex): 7 - Supraganglionic infarction (M4-M6 cortex): 3 Total score (0-10 with 10 being normal): 10 IMPRESSION: 1. No acute intracranial hemorrhage or infarct. 2. ASPECTS is 10 3. Advanced chronic white matter microangiopathy. 4. Bilateral mastoid effusions. These results were paged via AMION at the time of interpretation on 12/21/2020 at 12:39 pm to provider Dr Theda Sers. Electronically Signed   By: Valetta Mole M.D.   On: 12/05/2020 12:41   CT ANGIO HEAD CODE STROKE  Result Date: 01/02/2021 CLINICAL DATA:  Neuro deficit, acute, stroke suspected; Stroke/TIA, assess extracranial arteries  EXAM: CT ANGIOGRAPHY HEAD AND NECK TECHNIQUE: Multidetector CT imaging of the head and neck was performed using the standard protocol during bolus administration of intravenous contrast. Multiplanar CT image reconstructions and MIPs were obtained to evaluate the vascular anatomy. Carotid stenosis measurements (when applicable) are obtained utilizing NASCET criteria, using the distal internal carotid diameter as the denominator. CONTRAST:  15m OMNIPAQUE IOHEXOL 350 MG/ML SOLN COMPARISON:  Carotid ultrasound 11/24/2020. FINDINGS: CTA NECK FINDINGS Aortic arch: Great vessel origins are patent. Right carotid system: Mild carotid bifurcation atherosclerosis without greater than 50% stenosis. Left carotid system: Mild carotid bifurcation atherosclerosis without greater than 50% stenosis. Vertebral arteries: Left dominant. The right vertebral artery is small throughout its course. No evidence of significant (greater than 50%) stenosis. Bilateral vertebral arteries are tortuous. Skeleton: No acute abnormality. Other neck: Acute abnormality. Upper chest: Visualized lung apices are clear. Review of the MIP images confirms the above findings CTA HEAD FINDINGS Anterior circulation: Bilateral intracranial ICAs are patent with mild atherosclerotic narrowing bilateral MCAs are patent without proximal hemodynamically significant stenosis. Early left MCA bifurcation. Hypoplastic or absent right A1 ACA with prominent left A1 ACA, anatomic variant. Suspected severe stenosis of the right A2 ACA (see series 7, image 64) and moderate stenosis of the left A2 ACA. 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 No aneurysm identified. Posterior circulation: Small/non dominant right vertebral artery terminates as PICA, anatomic variant. 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 Basilar artery is patent with mild narrowing. Small vertebrobasilar system with prominent posterior  communicating arteries bilaterally and small P1 PCAs, anatomic variant. Bilateral posterior cerebral arteries are patent with up to mild stenosis. Venous sinuses: As permitted by contrast timing, patent. Anatomic variants: As detailed above. Review of the MIP images confirms  the above findings IMPRESSION: CTA Head: 1. No large vessel occlusion. 2. 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 3. 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 CTA Neck: No significant (greater than 50%) stenosis. Findings discussed with Dr. Theda Sers via telephone at 1:30 p.m. Electronically Signed   By: Margaretha Sheffield M.D.   On: 12/05/2020 13:39   CT ANGIO NECK CODE STROKE  Result Date: 01/02/2021 CLINICAL DATA:  Neuro deficit, acute, stroke suspected; Stroke/TIA, assess extracranial arteries EXAM: CT ANGIOGRAPHY HEAD AND NECK TECHNIQUE: Multidetector CT imaging of the head and neck was performed using the standard protocol during bolus administration of intravenous contrast. Multiplanar CT image reconstructions and MIPs were obtained to evaluate the vascular anatomy. Carotid stenosis measurements (when applicable) are obtained utilizing NASCET criteria, using the distal internal carotid diameter as the denominator. CONTRAST:  43m OMNIPAQUE IOHEXOL 350 MG/ML SOLN COMPARISON:  Carotid ultrasound 11/24/2020. FINDINGS: CTA NECK FINDINGS Aortic arch: Great vessel origins are patent. Right carotid system: Mild carotid bifurcation atherosclerosis without greater than 50% stenosis. Left carotid system: Mild carotid bifurcation atherosclerosis without greater than 50% stenosis. Vertebral arteries: Left dominant. The right vertebral artery is small throughout its course. No evidence of significant (greater than 50%) stenosis. Bilateral vertebral arteries are tortuous. Skeleton: No acute abnormality. Other neck: Acute abnormality. Upper chest: Visualized lung apices  are clear. Review of the MIP images confirms the above findings CTA HEAD FINDINGS Anterior circulation: Bilateral intracranial ICAs are patent with mild atherosclerotic narrowing bilateral MCAs are patent without proximal hemodynamically significant stenosis. Early left MCA bifurcation. Hypoplastic or absent right A1 ACA with prominent left A1 ACA, anatomic variant. Suspected severe stenosis of the right A2 ACA (see series 7, image 64) and moderate stenosis of the left A2 ACA. 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 No aneurysm identified. Posterior circulation: Small/non dominant right vertebral artery terminates as PICA, anatomic variant. 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 Basilar artery is patent with mild narrowing. Small vertebrobasilar system with prominent posterior communicating arteries bilaterally and small P1 PCAs, anatomic variant. Bilateral posterior cerebral arteries are patent with up to mild stenosis. Venous sinuses: As permitted by contrast timing, patent. Anatomic variants: As detailed above. Review of the MIP images confirms the above findings IMPRESSION: CTA Head: 1. No large vessel occlusion. 2. 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 3. 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 CTA Neck: No significant (greater than 50%) stenosis. Findings discussed with Dr. CTheda Sersvia telephone at 1:30 p.m. Electronically Signed   By: FMargaretha SheffieldM.D.   On: 12/08/2020 13:39    Procedures .Critical Care Performed by: ASuzy Bouchard PA-C Authorized by: ASuzy Bouchard PA-C   Critical care provider statement:    Critical care time (minutes):  45   Critical care was necessary to treat or prevent imminent or life-threatening deterioration of the following conditions:  CNS failure or compromise   Critical care was time spent personally by me  on the following activities:  Discussions with consultants, evaluation of patient's response to treatment, examination of patient, ordering and performing treatments and interventions, ordering and review of laboratory studies, ordering and review of radiographic studies, pulse oximetry, re-evaluation of patient's condition, obtaining history from patient or surrogate and review of old charts   I assumed direction of critical care for this patient from  another provider in my specialty: no     Care discussed with: admitting provider     Medications Ordered in ED Medications  0.9 %  sodium chloride infusion (has no administration in time range)  docusate (COLACE) 50 MG/5ML liquid 100 mg (has no administration in time range)  polyethylene glycol (MIRALAX / GLYCOLAX) packet 17 g (has no administration in time range)  fentaNYL (SUBLIMAZE) injection 25 mcg (has no administration in time range)  fentaNYL 2564mg in NS 2572m(1015mml) infusion-PREMIX (25 mcg/hr Intravenous Infusion Verify 12/17/2020 1510)  fentaNYL (SUBLIMAZE) bolus via infusion 25-100 mcg (75 mcg Intravenous Bolus from Bag 12/09/2020 1526)  midazolam (VERSED) 100 mg/100 mL (1 mg/mL) premix infusion (1 mg/hr Intravenous Infusion Verify 12/11/2020 1510)  midazolam (VERSED) bolus via infusion 0-5 mg (has no administration in time range)  LORazepam (ATIVAN) injection 0.5 mg (has no administration in time range)  fentaNYL (SUBLIMAZE) injection 50 mcg (has no administration in time range)  midazolam (VERSED) injection 1 mg (has no administration in time range)   stroke: mapping our early stages of recovery book (has no administration in time range)  acetaminophen (TYLENOL) tablet 650 mg (has no administration in time range)    Or  acetaminophen (TYLENOL) 160 MG/5ML solution 650 mg (has no administration in time range)    Or  acetaminophen (TYLENOL) suppository 650 mg (has no administration in time range)  senna-docusate (Senokot-S) tablet 1 tablet  (has no administration in time range)  pantoprazole (PROTONIX) injection 40 mg (has no administration in time range)  traMADol (ULTRAM) tablet 50 mg (has no administration in time range)  sodium chloride (hypertonic) 3 % solution (has no administration in time range)  levETIRAcetam (KEPPRA) 750 mg in sodium chloride 0.9 % 100 mL IVPB (has no administration in time range)  clevidipine (CLEVIPREX) infusion 0.5 mg/mL (has no administration in time range)  sodium chloride flush (NS) 0.9 % injection 3 mL (3 mLs Intravenous Given 12/18/2020 1309)  labetalol (NORMODYNE) injection 20 mg (20 mg Intravenous Given 12/12/2020 1300)  tenecteplase (TNKASE) injection for Stroke 16 mg (16 mg Intravenous Given 12/30/2020 1309)  iohexol (OMNIPAQUE) 350 MG/ML injection 65 mL (65 mLs Intravenous Contrast Given 01/01/2021 1311)  LORazepam (ATIVAN) 2 MG/ML injection (  Given 12/28/2020 1419)  tranexamic acid (CYKLOKAPRON) IVPB 1,000 mg (0 mg Intravenous Stopped 12/26/2020 1457)    ED Course  I have reviewed the triage vital signs and the nursing notes.  Pertinent labs & imaging results that were available during my care of the patient were reviewed by me and considered in my medical decision making (see chart for details).    MDM Rules/Calculators/A&P                          [age]ar old female presents to the ED as a code stroke.  Patient had sudden onset of left-sided weakness and aphasia.  Last known well 10:30 AM.  Upon arrival, patient found to be hypertensive.  Patient given labetalol by neurology.  Patient maintaining airway.  CT head negative for any acute abnormalities.  CTA head/neck negative for LVO.  Possible severe stenosis of right A2 ACA and right distal ACA.  Patient given TNKASE per neurology. Clevidipine also given. Discussed case with Dr. GraPearline Cableso evaluated patient at bedside and agrees with assessment and plan.   Unfortunately, patient decompensated and began having a left-sided gaze and worsening symptoms.  Patient taken emergently to the CT scanner where I personally supervised. Patient maintaining  airway at the time; however, began to show signs of airway compromise. Dr. Pearline Cables reported to CT scanner and patient was emergently intubated by Dr. Pearline Cables. See her note for full details.   Patient admitted to neurology service, Dr. Theda Sers. COVID test ordered. Final Clinical Impression(s) / ED Diagnoses Final diagnoses:  Left-sided weakness    Rx / DC Orders ED Discharge Orders     None        Suzy Bouchard, PA-C 70/34/03 [id number]    Gray, [place] P, DO 18/59/09 1614

## 2020-12-19 NOTE — ED Notes (Signed)
[name] daughter [phone]

## 2020-12-19 NOTE — Code Documentation (Signed)
Stroke Response Nurse Documentation Code Documentation  Elvena Jackovich Orloff is a 75 y.o. female arriving to [facility] ED via [facility] on 12/17/2020 with past medical hx of HTN. Not on any anticoagulants. Code stroke was activated by EMS.   Patient from eye doctor office where she was LKW at 1030 and now complaining of left sided weakness and slurred speech. Patient arrived to the office at 1030 walking easily with her walker and then was called for her appointment later unable to walk with slurred speech.  Stroke team at the bedside on patient arrival. Labs drawn and patient cleared for CT by Dr. Melina Copa. Patient to CT with team. NIHSS 12, see documentation for details and code stroke times. Patient with disoriented, right facial droop, left arm weakness, left leg weakness, left limb ataxia, Expressive aphasia , and dysarthria  on exam. The following imaging was completed:  CT, CTA head and neck. Patient is a candidate for IV Thrombolytic.  Care/Plan: q57mn x2h, q372m x6h, q1h x16h vitals and mNIHSS.   Bedside handoff with ED RN MiLegrand Como   MaCandace Cruise  Stroke Response RN

## 2020-12-19 NOTE — Progress Notes (Signed)
[facility] Unit # [id number] given started at 1727, as witnessed by Budd Palmer, RN.

## 2020-12-19 NOTE — Progress Notes (Signed)
Neurology Progress Note  Major interval events:  Head CT w/ worsening hemorrhage,   Subjective: Minimally responsive   Exam: Vitals:   12/10/2020 2000 12/06/2020 2100  BP: (!) 129/52 (!) 130/47  Pulse: 78 77  Resp: 18 (!) 23  Temp: (!) 100.4 F (38 C)   SpO2: 99% 100%    Neuro: MS: Does not open eyes to voice or noxious stim, does not follow commands CN: Pupils pinpoint slightly irreg ?slight reaction bilaterally. Roving eye movements. Corneals intact to eyelash brush, cough and gag intact  Sensory-Motor: RUE localizes, LUE withdraws. Bilateral lower extremities with slight extensor posturing on sternal rub, triple flexion on stim  Additionally noted on labs that the patient platelet count is in the 22s  Head CT personally reviewed, there is increasing hemorrhage including increasing blood within the lateral ventricles at the occipital horns concerning for developing hydrocephalus  Impression: Continues to have evidence of cortical function on examination and good brainstem function  Recommendations: -Confirmed with family that that intraventricular drain would be within patient's goals of care -Reached out to neurosurgery (Dr. Kathyrn Sheriff) to confirm they are available should she have further decompensation, appreciate given her current examination and recent TNKase administration and low platelets emergent intervention is not indicated but they are aware of the patient and will be available if needed -Discussed with Dr. Leonel Ramsay platelet transfusion  Lesleigh Noe MD-PhD [facility] [phone]  Available 7 PM to 7 AM, outside of these hours please call Neurologist on call as listed on Amion.  30 minutes of critical care time spent in care of the patient

## 2020-12-19 NOTE — ED Triage Notes (Signed)
Pt coming from eye Dr. Ricki Miller. Pt was ambulatory with walker into the Drs office and was seen normal at 1030. Pt was checked by staff "later" and was found to have left sided facial droop, slurred speech, and left sided weakness.

## 2020-12-19 NOTE — Progress Notes (Signed)
Daughter at bedside, consent for central line obtained.

## 2020-12-19 NOTE — Progress Notes (Signed)
BP above goal, lost cleviprex line. CCM at bedside to place line

## 2020-12-20 ENCOUNTER — Inpatient Hospital Stay (HOSPITAL_COMMUNITY): Payer: Medicare (Managed Care)

## 2020-12-20 DIAGNOSIS — L8990 Pressure ulcer of unspecified site, unspecified stage: Secondary | ICD-10-CM | POA: Insufficient documentation

## 2020-12-20 DIAGNOSIS — J9602 Acute respiratory failure with hypercapnia: Secondary | ICD-10-CM

## 2020-12-20 DIAGNOSIS — Z9282 Status post administration of tPA (rtPA) in a different facility within the last 24 hours prior to admission to current facility: Secondary | ICD-10-CM

## 2020-12-20 DIAGNOSIS — J9601 Acute respiratory failure with hypoxia: Secondary | ICD-10-CM | POA: Diagnosis not present

## 2020-12-20 DIAGNOSIS — I6389 Other cerebral infarction: Secondary | ICD-10-CM | POA: Diagnosis not present

## 2020-12-20 DIAGNOSIS — I161 Hypertensive emergency: Secondary | ICD-10-CM

## 2020-12-20 DIAGNOSIS — G936 Cerebral edema: Secondary | ICD-10-CM

## 2020-12-20 DIAGNOSIS — I63311 Cerebral infarction due to thrombosis of right middle cerebral artery: Secondary | ICD-10-CM | POA: Diagnosis not present

## 2020-12-20 LAB — BPAM PLATELET PHERESIS
Blood Product Expiration Date: 202209182359
ISSUE DATE / TIME: 202209162227
Unit Type and Rh: 6200

## 2020-12-20 LAB — ECHOCARDIOGRAM COMPLETE
Area-P 1/2: 3.5 cm2
Height: 62 in
P 1/2 time: 277 msec
Weight: 2250.46 oz

## 2020-12-20 LAB — PREPARE PLATELET PHERESIS: Unit division: 0

## 2020-12-20 LAB — COMPREHENSIVE METABOLIC PANEL
ALT: 9 U/L (ref 0–44)
AST: 18 U/L (ref 15–41)
Albumin: 2.7 g/dL — ABNORMAL LOW (ref 3.5–5.0)
Alkaline Phosphatase: 45 U/L (ref 38–126)
Anion gap: 9 (ref 5–15)
BUN: 10 mg/dL (ref 8–23)
CO2: 25 mmol/L (ref 22–32)
Calcium: 8 mg/dL — ABNORMAL LOW (ref 8.9–10.3)
Chloride: 112 mmol/L — ABNORMAL HIGH (ref 98–111)
Creatinine, Ser: 0.96 mg/dL (ref 0.44–1.00)
GFR, Estimated: >60 mL/min (ref >60)
Glucose, Bld: 159 mg/dL — ABNORMAL HIGH (ref 70–99)
Potassium: 3.3 mmol/L — ABNORMAL LOW (ref 3.5–5.1)
Sodium: 146 mmol/L — ABNORMAL HIGH (ref 135–145)
Total Bilirubin: 0.4 mg/dL (ref 0.3–1.2)
Total Protein: 5.5 g/dL — ABNORMAL LOW (ref 6.5–8.1)

## 2020-12-20 LAB — CBC
HCT: 32.7 % — ABNORMAL LOW (ref 36.0–46.0)
Hemoglobin: 10.5 g/dL — ABNORMAL LOW (ref 12.0–15.0)
MCH: 31.7 pg (ref 26.0–34.0)
MCHC: 32.1 g/dL (ref 30.0–36.0)
MCV: 98.8 fL (ref 80.0–100.0)
Platelets: 98 10*3/uL — ABNORMAL LOW (ref 150–400)
RBC: 3.31 MIL/uL — ABNORMAL LOW (ref 3.87–5.11)
RDW: 12.7 % (ref 11.5–15.5)
WBC: 5.2 10*3/uL (ref 4.0–10.5)
nRBC: 0 % (ref 0.0–0.2)

## 2020-12-20 LAB — GLUCOSE, CAPILLARY: Glucose-Capillary: 131 mg/dL — ABNORMAL HIGH (ref 70–99)

## 2020-12-20 LAB — PREPARE CRYOPRECIPITATE
Unit division: 0
Unit division: 0

## 2020-12-20 LAB — PHOSPHORUS: Phosphorus: 3.2 mg/dL (ref 2.5–4.6)

## 2020-12-20 LAB — TRIGLYCERIDES: Triglycerides: 95 mg/dL (ref <150)

## 2020-12-20 LAB — BPAM CRYOPRECIPITATE
Blood Product Expiration Date: 202209162150
Blood Product Expiration Date: 202209162150
ISSUE DATE / TIME: 202209161712
ISSUE DATE / TIME: 202209161712
Unit Type and Rh: 6200
Unit Type and Rh: 6200

## 2020-12-20 LAB — SODIUM
Sodium: 149 mmol/L — ABNORMAL HIGH (ref 135–145)
Sodium: 153 mmol/L — ABNORMAL HIGH (ref 135–145)
Sodium: 153 mmol/L — ABNORMAL HIGH (ref 135–145)

## 2020-12-20 LAB — MAGNESIUM: Magnesium: 2 mg/dL (ref 1.7–2.4)

## 2020-12-20 MED ORDER — LOSARTAN POTASSIUM 50 MG PO TABS
50.0000 mg | ORAL_TABLET | Freq: Two times a day (BID) | ORAL | Status: DC
  Administered 2020-12-20 – 2020-12-24 (×9): 50 mg
  Filled 2020-12-20 (×9): qty 1

## 2020-12-20 MED ORDER — CARVEDILOL 12.5 MG PO TABS
25.0000 mg | ORAL_TABLET | Freq: Two times a day (BID) | ORAL | Status: DC
  Administered 2020-12-20 (×2): 25 mg
  Filled 2020-12-20 (×3): qty 2

## 2020-12-20 MED ORDER — SENNOSIDES 8.8 MG/5ML PO SYRP
5.0000 mL | ORAL_SOLUTION | Freq: Two times a day (BID) | ORAL | Status: DC
  Administered 2020-12-20 – 2020-12-23 (×9): 5 mL
  Filled 2020-12-20 (×9): qty 5

## 2020-12-20 MED ORDER — MEMANTINE HCL 10 MG PO TABS
10.0000 mg | ORAL_TABLET | Freq: Two times a day (BID) | ORAL | Status: DC
  Administered 2020-12-20 – 2020-12-24 (×9): 10 mg
  Filled 2020-12-20 (×9): qty 1

## 2020-12-20 MED ORDER — AMLODIPINE BESYLATE 10 MG PO TABS
10.0000 mg | ORAL_TABLET | Freq: Every day | ORAL | Status: DC
  Administered 2020-12-20 – 2020-12-24 (×5): 10 mg
  Filled 2020-12-20 (×5): qty 1

## 2020-12-20 MED ORDER — POTASSIUM CHLORIDE 20 MEQ PO PACK
20.0000 meq | PACK | ORAL | Status: AC
Start: 2020-12-20 — End: 2020-12-20
  Administered 2020-12-20 (×2): 20 meq
  Filled 2020-12-20 (×2): qty 1

## 2020-12-20 MED ORDER — LEVETIRACETAM 100 MG/ML PO SOLN
750.0000 mg | Freq: Two times a day (BID) | ORAL | Status: DC
  Administered 2020-12-20 – 2020-12-24 (×8): 750 mg
  Filled 2020-12-20 (×8): qty 10

## 2020-12-20 MED ORDER — CHLORHEXIDINE GLUCONATE CLOTH 2 % EX PADS
6.0000 | MEDICATED_PAD | Freq: Every day | CUTANEOUS | Status: DC
  Administered 2020-12-21 – 2020-12-23 (×4): 6 via TOPICAL

## 2020-12-20 MED ORDER — POTASSIUM CHLORIDE 10 MEQ/50ML IV SOLN
10.0000 meq | INTRAVENOUS | Status: AC
  Administered 2020-12-20 (×4): 10 meq via INTRAVENOUS
  Filled 2020-12-20 (×4): qty 50

## 2020-12-20 MED ORDER — DONEPEZIL HCL 10 MG PO TABS
10.0000 mg | ORAL_TABLET | Freq: Every day | ORAL | Status: DC
  Administered 2020-12-20 – 2020-12-23 (×4): 10 mg
  Filled 2020-12-20 (×5): qty 1

## 2020-12-20 NOTE — Progress Notes (Signed)
PT Cancellation Note  Patient Details Name: [name] MRN: [id number] DOB: May 21, 1945   Cancelled Treatment:    Reason Eval/Treat Not Completed: Active bedrest order  Zebulen Simonis A. Gilford Rile PT, DPT Acute [facility] Services Pager [phone] Office [phone]    Linna Hoff 12/20/2020, 8:25 AM

## 2020-12-20 NOTE — Progress Notes (Signed)
Patient transported on vent to MRI and returned to [id number] without complications.

## 2020-12-20 NOTE — Progress Notes (Signed)
AM K+ 3.3 with creat 0.96 and GFR > 60. CCM ELink electrolyte protocol initiated.

## 2020-12-20 NOTE — Progress Notes (Signed)
OT Cancellation Note  Patient Details Name: [name] MRN: [id number] DOB: Jul 12, 1945   Cancelled Treatment:    Reason Eval/Treat Not Completed: Active bedrest order (until 08/19/20; 15:17)  [name] 12/20/2020, 7:59 AM  Jesse Sans OTR/L Acute Rehabilitation Services Pager: [phone] Office: [phone]

## 2020-12-20 NOTE — Progress Notes (Signed)
Repeat CTH reviewed, stable ventricular size w/o hydrocephalus. Right thalamic/left ganglionic IPH also appears stable. No indication for EVD placement at this time.

## 2020-12-20 NOTE — Progress Notes (Signed)
STROKE TEAM PROGRESS NOTE   INTERVAL HISTORY No acute events overnight. Remains intubated, sedated and on cleviprex drip.  Daughter is at bedside. She reports patient lives alone. Mild dementia per daughter but having steady decline over past few months. Helper comes in two days a week with cleaning. She supplements meals on wheels and visits at least daily. She required prompting to ensure she was eating. Patient performs ADLs independently but no longer is able to cook.  Walks with walker. She ensures patient takes meds. She recently had to take over financial activities this summer. She was also suspicious that patient was falling.Colon cancer cured from their understanding.Follows in clinic for seizures.  Reports patient is DNR.   Dr. Erlinda Hong discussed the history, recent events, work up, diagnostic findings and plan of care. He explained that prognosis is guarded. He advised family to consider goals of care is she does not make progress. Questions were answered.   Vitals:   12/20/20 1625 12/20/20 1645 12/20/20 1700 12/20/20 1715  BP: (!) 150/53 (!) 135/50 (!) 129/52 (!) 127/51  Pulse: 96 76 71 68  Resp: (!) $RemoveB'22 18 18 18  '[id number]$ Temp:      TempSrc:      SpO2: 97% 98% 99% 99%  Weight:      Height:       CBC:  Recent Labs  Lab 01/01/2021 1329 12/30/2020 1601 12/12/2020 2146 12/20/20 0517  WBC 1.9*  --  6.9 5.2  NEUTROABS 1.0*  --   --   --   HGB 12.0   < > 11.5* 10.5*  HCT 36.9   < > 35.1* 32.7*  MCV 98.9  --  96.7 98.8  PLT 61*  --  68* 98*   < > = values in this interval not displayed.   Basic Metabolic Panel:  Recent Labs  Lab 12/21/2020 1329 12/04/2020 1601 12/11/2020 2124 12/20/20 0516 12/20/20 0756 12/20/20 1153  NA 138 141   < > 146*  --  149*  K 3.5 3.0*  --  3.3*  --   --   CL 106  --   --  112*  --   --   CO2 29  --   --  25  --   --   GLUCOSE 94  --   --  159*  --   --   BUN 6*  --   --  10  --   --   CREATININE 0.80  --   --  0.96  --   --   CALCIUM 8.4*  --   --  8.0*  --    --   MG  --   --   --   --  2.0  --   PHOS  --   --   --   --  3.2  --    < > = values in this interval not displayed.   Lipid Panel:  Recent Labs  Lab 12/20/20 0757  TRIG 95   HgbA1c: No results for input(s): HGBA1C in the last 168 hours. Urine Drug Screen: No results for input(s): LABOPIA, COCAINSCRNUR, LABBENZ, AMPHETMU, THCU, LABBARB in the last 168 hours.  Alcohol Level No results for input(s): ETH in the last 168 hours.  IMAGING past 24 hours CT HEAD WO CONTRAST (5MM)  Result Date: 12/20/2020 CLINICAL DATA:  Follow-up intracranial hemorrhage EXAM: CT HEAD WITHOUT CONTRAST TECHNIQUE: Contiguous axial images were obtained from the base of the skull through the vertex without intravenous  contrast. COMPARISON:  Yesterday FINDINGS: Brain: Right thalamic and left putamen/internal capsule region hematomas are unchanged. The right thalamic hemorrhage measures up to 2 cm in thickness and the left subcortical hemorrhage measures 34 x 25 mm. Intraventricular clot is seen in the occipital horns of the lateral ventricles and layering in the fourth ventricle. No change in the normal ventricular volume. Confluent chronic small vessel ischemia in the hemispheric white matter. No visible cortical infarct. Vascular: No hyperdense vessel or unexpected calcification. Skull: Normal. Negative for fracture or focal lesion. Sinuses/Orbits: Patchy bilateral mastoid and sinus opacification in the setting of intubation. Bilateral cataract resection IMPRESSION: 1. Unchanged parenchymal and intraventricular clot. No hydrocephalus or midline shift. 2. Severe chronic small vessel disease. Electronically Signed   By: Jorje Guild M.D.   On: 12/20/2020 04:14   CT HEAD WO CONTRAST  Result Date: 12/13/2020 CLINICAL DATA:  Follow-up intracranial hemorrhage. EXAM: CT HEAD WITHOUT CONTRAST TECHNIQUE: Contiguous axial images were obtained from the base of the skull through the vertex without intravenous contrast.  COMPARISON:  December 19, 2020 FINDINGS: Brain: There is mild cerebral atrophy with widening of the extra-axial spaces and ventricular dilatation. There are areas of decreased attenuation within the white matter tracts of the supratentorial brain, consistent with microvascular disease changes. A predominantly stable 2.0 cm AP x 2.1 cm TV x 1.7 cm CC acute right thalamic bleed is seen (measured 1.8 cm AP x 2.6 cm TV x 1.8 cm CC on the prior study). Mass effect on the third ventricle is again noted without evidence of subsequent hydrocephalus. An additional 3.4 cm AP x 2.5 cm TV x 3.7 cm CC acute intraparenchymal hematoma is seen centered in the left lentiform nucleus. This is increased in size when compared to the prior study (measured 3.3 cm AP x 2.3 cm transverse by 2.7 cm CC on the prior study). A moderate amount of acute intraventricular blood is seen within the posterior aspects of the lateral ventricles. This is increased in severity when compared to the prior study. A stable amount of acute blood is noted within the third ventricle. Vascular: Bilateral cavernous carotid artery calcification is seen. Skull: Normal. Negative for fracture or focal lesion. Sinuses/Orbits: No acute finding. Other: None. IMPRESSION: 1. Moderate amount of acute intraventricular blood within the posterior aspects of the lateral ventricles, increased in severity when compared to the prior study. 2. Predominantly stable right thalamic intraparenchymal bleed with persistent mass effect on the third ventricle. 3. Intraparenchymal hematoma centered in the left lentiform nucleus, increased in size when compared to the prior exam. Electronically Signed   By: Virgina Norfolk M.D.   On: 12/27/2020 20:35   MR BRAIN WO CONTRAST  Result Date: 12/20/2020 CLINICAL DATA:  Stroke follow-up. EXAM: MRI HEAD WITHOUT CONTRAST TECHNIQUE: Multiplanar, multiecho pulse sequences of the brain and surrounding structures were obtained without intravenous  contrast. COMPARISON:  Head CT 12/20/2020 and MRI 06/19/2015 FINDINGS: The study had to be discontinued prior to completion due to the patient's condition. Axial and coronal diffusion, SWI, axial FLAIR, and axial T2 sequences were obtained and are moderately motion degraded. Brain: Parenchymal hemorrhages centered in the right thalamus and left lentiform nucleus/internal capsule region measure 2.5 x 2.1 cm and 4.3 x 2.7 cm, respectively, similar to today's earlier CT. There is mild surrounding edema without midline shift. Layering hemorrhage in the occipital horns of the lateral ventricles as well as a small amount of hemorrhage in the fourth ventricle are also similar to today's CT. There is  a small acute infarct in the right corona radiata, and there are punctate acute or subacute infarcts in the right parietal lobe near the posterior aspect of the sylvian fissure, right anterior perforated substance region, left caudate nucleus, and left frontal lobe near the operculum. Patchy and confluent T2 hyperintensities in the cerebral white matter bilaterally have progressed from the 2017 MRI and are nonspecific but compatible with severe chronic small vessel ischemic disease. Chronic lacunar infarcts are noted in the cerebral white matter, deep gray nuclei, and pons. There is mild cerebral and moderate cerebellar atrophy. Multiple chronic microhemorrhages are scattered throughout both cerebral hemispheres as well as in the brainstem and cerebellum. Sharply demarcated FLAIR hyperintensity in the posterior aspects of the right greater than left cerebellar hemispheres is without a corresponding abnormality on standard T2 or diffusion-weighted imaging and may be artifactual. The ventricles are normal in size. Vascular: Major intracranial vascular flow voids are preserved. Skull and upper cervical spine: No destructive skull lesion. Sinuses/Orbits: Bilateral cataract extraction. Mild mucosal thickening in the paranasal  sinuses, greatest in the left sphenoid sinus. Moderately large bilateral mastoid effusions. Other: None. IMPRESSION: 1. Motion degraded, incomplete examination. 2. Unchanged right thalamic and left basal ganglia/internal capsule hemorrhages and small volume intraventricular hemorrhage. 3. Scattered small acute to subacute bilateral cerebral infarcts, largest in the right corona radiata. 4. Severe chronic small vessel ischemic disease with multiple chronic lacunar infarcts and chronic microhemorrhages as above. Electronically Signed   By: Logan Bores M.D.   On: 12/20/2020 14:39   DG CHEST PORT 1 VIEW  Result Date: 12/23/2020 CLINICAL DATA:  Acute respiratory failure EXAM: PORTABLE CHEST 1 VIEW COMPARISON:  11/12/2020 FINDINGS: Interval intubation, tip of the endotracheal tube is about 3 cm superior to carina. Esophageal tube tip below the diaphragm but incompletely visualized. Left IJ central venous catheter tip over the SVC. Small pleural effusions with airspace disease at left base. Normal cardiac size. There appears to be a small amount of left axillary or upper extremity soft tissue gas. A small venous catheter is noted in the region. IMPRESSION: 1. Support lines and tubes as above. 2. Small bilateral effusions with left basilar atelectasis, pneumonia, or aspiration 3. Suspicion of small amount of gas in the left axillary/upper extremity soft tissues with venous catheter in the region, correlate for catheter function Electronically Signed   By: Donavan Foil M.D.   On: 12/11/2020 18:46   ECHOCARDIOGRAM COMPLETE  Result Date: 12/20/2020    ECHOCARDIOGRAM REPORT   Patient Name:   LASHEIKA ORTLOFF Date of Exam: 12/20/2020 Medical Rec #:  [id number]          Height:       62.0 in Accession #:    [id number]         Weight:       140.7 lb Date of Birth:  03-29-1946          BSA:          1.646 m Patient Age:    75 years           BP:           147/57 mmHg Patient Gender: F                  HR:           80  bpm. Exam Location:  Inpatient Procedure: 2D Echo, Cardiac Doppler and Color Doppler Indications:    Stroke I63.9  History:  Patient has no prior history of Echocardiogram examinations.                 Stroke; Risk Factors:Non-Smoker and Hypertension. Breast Mass.  Sonographer:    Leavy Cella RDCS Referring Phys: [place]  1. Left ventricular ejection fraction, by estimation, is 70 to 75%. The left ventricle has hyperdynamic function. The left ventricle has no regional wall motion abnormalities. Left ventricular diastolic parameters are consistent with Grade I diastolic dysfunction (impaired relaxation).  2. Right ventricular systolic function is normal. The right ventricular size is normal. Tricuspid regurgitation signal is inadequate for assessing PA pressure.  3. Left atrial size was mildly dilated.  4. The mitral valve is degenerative. No evidence of mitral valve regurgitation. No evidence of mitral stenosis.  5. The aortic valve is tricuspid. Aortic valve regurgitation is not visualized. No aortic stenosis is present. Conclusion(s)/Recommendation(s): No intracardiac source of embolism detected on this transthoracic study. A transesophageal echocardiogram is recommended to exclude cardiac source of embolism if clinically indicated. FINDINGS  Left Ventricle: Left ventricular ejection fraction, by estimation, is 70 to 75%. The left ventricle has hyperdynamic function. The left ventricle has no regional wall motion abnormalities. The left ventricular internal cavity size was normal in size. There is no left ventricular hypertrophy. Left ventricular diastolic parameters are consistent with Grade I diastolic dysfunction (impaired relaxation). Right Ventricle: The right ventricular size is normal. No increase in right ventricular wall thickness. Right ventricular systolic function is normal. Tricuspid regurgitation signal is inadequate for assessing PA pressure. Left Atrium: Left  atrial size was mildly dilated. Right Atrium: Right atrial size was normal in size. Pericardium: Trivial pericardial effusion is present. Mitral Valve: The mitral valve is degenerative in appearance. Mild mitral annular calcification. No evidence of mitral valve regurgitation. No evidence of mitral valve stenosis. Tricuspid Valve: The tricuspid valve is grossly normal. Tricuspid valve regurgitation is trivial. No evidence of tricuspid stenosis. Aortic Valve: The aortic valve is tricuspid. Aortic valve regurgitation is not visualized. Aortic regurgitation PHT measures 277 msec. No aortic stenosis is present. Pulmonic Valve: The pulmonic valve was grossly normal. Pulmonic valve regurgitation is mild. No evidence of pulmonic stenosis. Aorta: The aortic root is normal in size and structure. Venous: IVC assessment for right atrial pressure unable to be performed due to mechanical ventilation. IAS/Shunts: The atrial septum is grossly normal.  LEFT VENTRICLE PLAX 2D LVIDd:         4.07 cm  Diastology LV PW:         1.25 cm  LV e' medial:    8.59 cm/s LV IVS:        1.13 cm  LV E/e' medial:  9.8 LVOT diam:     2.10 cm  LV e' lateral:   8.27 cm/s LVOT Area:     3.46 cm LV E/e' lateral: 10.2  RIGHT VENTRICLE RV S prime:     20.40 cm/s TAPSE (M-mode): 3.3 cm LEFT ATRIUM             Index       RIGHT ATRIUM           Index LA Vol (A2C):   51.8 ml 31.47 ml/m RA Area:     15.50 cm LA Vol (A4C):   64.0 ml 38.88 ml/m RA Volume:   42.00 ml  25.52 ml/m LA Biplane Vol: 56.8 ml 34.51 ml/m  AORTIC VALVE AI PHT:      277 msec  AORTA Ao Root diam:  2.90 cm MITRAL VALVE MV Area (PHT): 3.50 cm    SHUNTS MV Decel Time: 217 msec    Systemic Diam: 2.10 cm MV E velocity: 84.10 cm/s MV A velocity: 95.10 cm/s MV E/A ratio:  0.88 Eleonore Chiquito MD Electronically signed by Eleonore Chiquito MD Signature Date/Time: 12/20/2020/1:13:12 PM    Final     PHYSICAL EXAM  General - Well nourished, well developed, intubated on low dose sedation.    Ophthalmologic - fundi not visualized due to noncooperation.   Cardiovascular - Regular rate and rhythm.   Neuro - intubated on low dose fentanyl, eyes open on pain stimulation, not following commands. With forced eye opening, eyes in mild position, not blinking to visual threat, doll's eyes sluggish, not tracking, b/l pupil pinpoint. Corneal reflex present, gag and cough present. Breathing over the vent.  Facial symmetry not able to test due to ET tube.  Tongue protrusion not cooperative. On pain stimulation, extension of BUEs, right stronger than left, withdraw in BLEs, right barely against gravity and left not able to against gravity. DTR 1+ and bilateral positive babinski. Sensation, coordination and gait not tested.    ASSESSMENT/PLAN Ms. GWYNETH FERNANDEZ is a 75 y.o. 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 Bone marrow biopsy obtained in 2019 which showed no abnormalities. Also with  remote jugular vein thrombosis s/p lovenox therapy, 2011 Colon cancer s/p colectomy and chemotherapy with associated thrombocytopenia,  leukopenia, and bowel obstruction, postherpetic neuralgia, lumbar DDD, and walking difficulty. She presented to the ED from her ophthalmologist visit where she developed severe left sided weakness, facial droop, and slurred speech around 1030. EMS arrived and found CBG 96 and BP severely elevated at 230/120. Patient was encephalopathic with speech difficulty upon arrival. NIHSS 9. NCT head showed no acute intracranial hemorrhage or ischemic changes. ASPECTS 10. 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 tNK was administered once blood pressure was adequately controlled. She later  developed exam decline and was found to have bilateral intraparenchymal hematomas in right thalamus and left lentiform nucleus with intraventricular extension on repeat HCT. She was intubated for airway protection, type & cross and TXA/cryo administered and admitted to the neurologic ICU for further care.   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 ASPECTS is 10 Advanced chronic white matter microangiopathy. Bilateral mastoid effusions.  CTA head & neck  No large vessel occlusion. 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  Repeat CT head 9/16 New acute intracranial hematomas in the right thalamus and left lentiform nucleus with intraventricular extension as above. Mass effect from the right thalamic hemorrhage distorts the third ventricle without evidence of upstream hydrocephalus.   Follow up CT head 9/16 Moderate amount of acute intraventricular blood within the posterior aspects of the lateral ventricles, increased in severity when compared to the prior study. Predominantly stable right thalamic intraparenchymal bleed with persistent mass effect on the third ventricle. Intraparenchymal hematoma centered in the left lentiform nucleus, increased in size when compared to the  prior exam. MRI  Unchanged right thalamic and left basal ganglia/internal capsule hemorrhages and small volume intraventricular hemorrhage. Scattered small acute to subacute bilateral cerebral infarcts, largest in the right corona radiata. Severe chronic small vessel ischemic disease with multiple chronic lacunar infarcts and chronic microhemorrhages as above. 2D Echo  EF 56-81%, Grade I diastolic  dysfunction Left atrial size was mildly dilated.   Left ventricle is hyperdynamic function. No thrombus, wall motion abnormality or shunt found.   LDL pending HgbA1c pending VTE prophylaxis - on hold  On ASA $Remo'81mg'RPAWZ$  daily prior to admission DNR status  Therapy recommendations:  pending stability Disposition:  TBD        ICH after tNK S/p TXA/cryo and plt transfusion On hypertonic saline infusion Monitor Na, goal 145-155 Na checks q 6 hours Na 141->146->149 Neurosurgery consulted: no acute intervention  Obtain Stat HCT for any worsening of current neurologic symptoms or decline  Chronic Thrombocytopenia, mild pancytopenia S/p bone marrow biopsy 2019 which was normal S/p plt transfusion Monitor CBC  Plt 61->68->98  Acute respiratory failure Intubated for airway protection during neurologic decline Appreciate CCM management        Recent history of left CRAO Per yesterday's note from Ophthalmologist, Dr. Coralyn Pear, patient with left CRAO in June with stroke and GCA work up recommended. Unclear at present if this happened. Check ESR and CRP in am PENDING  Hypertension Severe elevation on admission Now stable on cleviprex infusion Keep systolic BP 275-170 systolic  Long-term BP goal normotensive  Hyperlipidemia Home meds:  None LDL pending, goal < 70 High intensity statin on hold at present Continue statin at discharge        Glycemic Control Home meds:  None, no hx of DM2 HgbA1c Pending goal < 7.0 CBGs q 6 hours         History of seizure On Keppra $RemoveB'500mg'wGgmAxfI$  BID at home, continued at $RemoveBefo'750mg'acGiVsSEPpF$  dose  Keppra level PENDING         Feeding/Nutrition Ok to start tube feeding         Chronic Pain On Nucynta q 12 hours at baseline  Other Stroke Risk Factors Advanced Age >/= 51  Hx DVT  Other Active Problems   Hospital day # 1  This patient was seen and evaluated with Dr. Erlinda Hong. He directed the plan of care.  Charlene Brooke, NP-C   ATTENDING NOTE: I reviewed above note and agree with the assessment and plan. Pt was  seen and examined.   75 year old female with history of DVT, hypertension, colon cancer status postsurgery and chemotherapy in 2011, seizure on Keppra, [facility]CI admitted for left-sided weakness, facial droop and slurred speech.  BP was high on presentation 230/120.  CT no acute abnormality.  Status post TNK.  CT head and neck showed bilateral A2 stenosis, no LVO.  However, patient developed neuro changes, CT repeat showed left BG/IC and right thalamic ICH with bilateral IVH.  Fibrinogen level 415.  Received TXA and cryo infusion.  Also found to have thrombocytopenia, platelet 61, repeat platelet 68, status post platelet transfusion.  Repeat platelet 98.  Repeat CT in a.m. showed stable hematoma.  Patient was intubated and admitted to neuro ICU.  MRI showed stable bilateral hematoma and also bilateral scattered embolic infarcts with largest at right BG/CR.  EF 70 to 75%, LDL and A1c pending.  Creatinine 0.80->0.96.  Currently on 3% saline, sodium 141-146-149.  Low-grade fever overnight, this morning afebrile.  On exam, patient daughter and granddaughter are at bedside, patient  intubated on low dose fentanyl, eyes open on pain stimulation, not following commands. With forced eye opening, eyes in mild position, not blinking to visual threat, doll's eyes sluggish, not tracking, b/l pupil pinpoint. Corneal reflex present, gag and cough present. Breathing over the vent.  Facial symmetry not able to test due to ET tube.  Tongue protrusion not cooperative. On pain stimulation, extension of BUEs, right stronger than left, withdraw in BLEs, right barely against gravity and left not able to against gravity. DTR 1+ and bilateral positive babinski. Sensation, coordination and gait not tested.  Etiology for patient stroke not quite clear, however, due to current bilateral hematoma status post TNK, will hold off further embolic work-up at this time.  Patient still on Cleviprex, will add Coreg 25 twice daily, losartan 50 twice  daily, amlodipine 10 to try to taper off Cleviprex with BP goal < 160 now.  Still intubated, on vent, management per CCM.  On 3% saline, monitor sodium level.  Continue home [place] for seizure prevention.  Continue monitor CBC for thrombocytopenia, ESR/CRP pending for prior CRAO work-up.  Had long discussion with family at bedside, would like to continue current care for the next several days to see if there is any improvement.  If no improvement, may need further GOC discussion.  For detailed assessment and plan, please refer to above as I have made changes wherever appropriate.   This patient is critically ill due to embolic stroke status post TNK followed by bilateral hemorrhagic transformation with cerebral edema, respiratory failure, hypertensive emergency and at significant risk of neurological worsening, death form hematoma expansion, brain herniation, recurrent stroke, hypertensive encephalopathy, seizure. This patient's care requires constant monitoring of vital signs, hemodynamics, respiratory and cardiac monitoring, review of multiple databases, neurological assessment, discussion with family, other specialists and medical decision making of high complexity. I spent 55 minutes of neurocritical care time in the care of this patient. I had long discussion with daughter and granddaughter at bedside, updated pt current condition, treatment plan and potential prognosis, and answered all the questions.  They expressed understanding and appreciation.    Rosalin Hawking, MD PhD Stroke Neurology 12/20/2020 6:49 PM    To contact Stroke Continuity provider, please refer to [email or web address] After hours, contact General Neurology

## 2020-12-20 NOTE — Progress Notes (Signed)
Pt transported to CT from 4N17 and back w/o complications. RT will cont to monitor.

## 2020-12-20 NOTE — Progress Notes (Signed)
NAME:  [name], MRN:  [id number], DOB:  October 17, 1945, LOS: 1 ADMISSION DATE:  12/28/2020, CONSULTATION DATE:  12/06/2020 REFERRING MD:  Lynnae Sandhoff MD , CHIEF COMPLAINT:   [place]  History of Present Illness:  76 Y?O with H/O colon cancer, DVT, HTN Admitted with acute stroke give lytics in ED, intubated for airway protection. 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   Significant Hospital Events: Including procedures, antibiotic start and stop dates in addition to other pertinent events   9/16 admitted for stroke.  Developed intracranial hemorrhage after lytics  Interim History / Subjective:  This morning on propfool fentanyl. Sister and  BIL at bedside  Objective   Blood pressure (!) 147/57, pulse 92, temperature 98.9 F (37.2 C), temperature source Axillary, resp. rate 17, height $RemoveBe'5\' 2"'WcKINZull$  (1.575 m), weight 63.8 kg, SpO2 97 %.    Vent Mode: PRVC FiO2 (%):  [30 %-40 %] 30 % Set Rate:  [18 bmp] 18 bmp Vt Set:  [400 mL] 400 mL PEEP:  [5 cmH20] 5 cmH20 Plateau Pressure:  [12 cmH20-16 cmH20] 14 cmH20   Intake/Output Summary (Last 24 hours) at 12/20/2020 1106 Last data filed at 12/20/2020 0900 Gross per 24 hour  Intake 2393.35 ml  Output 160 ml  Net 2233.35 ml   Filed Weights   12/10/2020 1200  Weight: 63.8 kg    Examination: Gen:      No acute distress, elderly female, intubated, sedated HEENT:  ETT to vent, pupils 1-2 mm and minimally  reactive Lungs:    Clear to auscultation bilaterally; normal respiratory effort CV:         tachycardic, regular Abd:      Soft, nontender Ext:    No edema; adequate peripheral perfusion Skin:      Warm and dry; no rash Neuro:  withdraws to pain in LE, moves bilateral LE. Extensor posturing with stimuli in the RUE, nothing on the left.   Lab/imaging reviewed Na 146, K 3.3, Cr 0.96, WBC 5.2, Mg 2.0  CT head repeat this morning shows stable and unchanged ICH, no midline shift of hydrocephalus. Severe chronic small vessel disease.   Resolved Hospital Problem list    Assessment & Plan:  [facility] afer TPA Underlying Dementia Seizure Disorder Continue 3% Na with goal Na 145-155 Follow Na q6 Cleveprex for SBP < 140 Keppra Neurosurgery consulted by primary team - no role for EVD at this time since no hydrocephalus MRI when stable pending  Acute resp failure, failure to protect airway due to stoke Asthma Follow CXR, ABG Nebs PRN  Leukemia, thrombocytopenia This is a chronic issue with normal bone marrow biopsy in 2019.  Exacerbated by critical illness Follow CBC. Consider platelet transfusion if count drops <50K  Best Practice (right click and "Reselect all SmartList Selections" daily)   Diet/type: NPO DVT prophylaxis: not indicated GI prophylaxis: PPI Lines: Central line Foley:  Yes, and it is still needed Code Status:  DNR Last date of multidisciplinary goals of care discussion sister and BIL updated at bedside 9/17 am and  confirmed code status.  She was living in assisted living and had reasonable quality of life but was ambulating with walker, had underlying dementia and seizure disorder. She has made her wishes clear about DNR status and has paperwork to reflect this which sister will bring in. Discussed that we will wean sedation and re-evaluate mental status in 24 hours and get MRI. Concern for devastating neurologic injury at which point they would want to move to comfort  measures for her.   Labs   CBC: Recent Labs  Lab 12/28/2020 1229 12/06/2020 1329 12/05/2020 1601 12/12/2020 2146 12/20/20 0517  WBC  --  1.9*  --  6.9 5.2  NEUTROABS  --  1.0*  --   --   --   HGB 12.2 12.0 12.9 11.5* 10.5*  HCT 36.0 36.9 38.0 35.1* 32.7*  MCV  --  98.9  --  96.7 98.8  PLT  --  61*  --  68* 98*    Basic Metabolic Panel: Recent Labs  Lab 01/02/2021 1229 01/02/2021 1329 12/20/2020 1601 01/01/2021 2124 12/20/20 0516 12/20/20 0756  NA 141 138 141 141 146*  --   K 3.7 3.5 3.0*  --  3.3*  --   CL 103 106  --   --  112*  --   CO2  --  29  --   --  25  --   GLUCOSE 94 94  --   --  159*  --   BUN 5* 6*  --   --  10  --   CREATININE 0.70 0.80  --   --  0.96  --   CALCIUM  --  8.4*  --   --  8.0*  --   MG  --   --   --   --   --  2.0  PHOS  --   --   --   --   --  3.2   GFR: Estimated Creatinine Clearance: 43.1 mL/min (by C-G formula based on SCr of 0.96 mg/dL). Recent Labs  Lab 12/23/2020 1329 12/23/2020 2146 12/20/20 0517  WBC 1.9* 6.9 5.2    Liver Function Tests: Recent Labs  Lab 12/15/2020 1329 12/20/20 0516  AST 16 18  ALT 8 9  ALKPHOS 48 45  BILITOT 0.4 0.4  PROT 6.0* 5.5*  ALBUMIN 2.9* 2.7*   No results for input(s): LIPASE, AMYLASE in the last 168 hours. No results for input(s): AMMONIA in the last 168 hours.  ABG    Component Value Date/Time   PHART 7.405 01/02/2021 1601   PCO2ART 46.4 12/31/2020 1601   PO2ART 61 (L) 12/17/2020 1601   HCO3 29.1 (H) 12/28/2020 1601   TCO2 30 12/18/2020 1601   O2SAT 91.0 12/10/2020 1601     Coagulation Profile: Recent Labs  Lab 12/28/2020 1329  INR 1.0    Cardiac Enzymes: No results for input(s): CKTOTAL, CKMB, CKMBINDEX, TROPONINI in the last 168 hours.  HbA1C: No results found for: HGBA1C  CBG: Recent Labs  Lab 12/15/2020 1222  GLUCAP 106*     Critical care time:    The patient is critically ill due to [place], stroke, respiratory failure.  Critical care was necessary to treat or prevent imminent or  life-threatening deterioration.  Critical care was time spent personally by me on the following activities: development of treatment plan with patient and/or surrogate as well as nursing, discussions with consultants, evaluation of patient's response to treatment, examination of patient, obtaining history from patient or surrogate,  ordering and performing treatments and interventions, ordering and review of laboratory studies, ordering and review of radiographic studies, pulse oximetry, re-evaluation of patient's condition and participation in multidisciplinary rounds.   Critical Care Time devoted to patient care services described in this note is 55 minutes. This time reflects time of care of this [place] . This critical care time does not reflect separately billable procedures or procedure time, teaching time or supervisory time of PA/NP/Med student/Med Resident etc but could involve care discussion time.       Spero Geralds [facility] 12/20/2020 11:06 AM  Pager: see AMION  If no response to pager , please call critical care on call (see AMION) until 7pm After 7:00 pm call [place]

## 2020-12-20 NOTE — Progress Notes (Signed)
SLP Cancellation Note  Patient Details Name: ADEENA APGAR MRN: [id number] DOB: [birth year]   Cancelled treatment:       Reason Eval/Treat Not Completed: Medical issues which prohibited therapy Pt is presently intubated and sedated; unable to participate in cognitive-linguistic evaluation at this time.  Will continue efforts.  Nikyah Lackman P. Sreenidhi Ganson, M.S., [place] Pathologist Acute Rehabilitation Services Pager: [place] 12/20/2020, 11:36 AM

## 2020-12-21 DIAGNOSIS — I63413 Cerebral infarction due to embolism of bilateral middle cerebral arteries: Secondary | ICD-10-CM | POA: Diagnosis not present

## 2020-12-21 DIAGNOSIS — Z66 Do not resuscitate: Secondary | ICD-10-CM

## 2020-12-21 DIAGNOSIS — J9601 Acute respiratory failure with hypoxia: Secondary | ICD-10-CM | POA: Diagnosis not present

## 2020-12-21 DIAGNOSIS — D696 Thrombocytopenia, unspecified: Secondary | ICD-10-CM

## 2020-12-21 LAB — BASIC METABOLIC PANEL
Anion gap: 5 (ref 5–15)
BUN: 10 mg/dL (ref 8–23)
CO2: 24 mmol/L (ref 22–32)
Calcium: 8 mg/dL — ABNORMAL LOW (ref 8.9–10.3)
Chloride: 129 mmol/L — ABNORMAL HIGH (ref 98–111)
Creatinine, Ser: 0.7 mg/dL (ref 0.44–1.00)
GFR, Estimated: >60 mL/min (ref >60)
Glucose, Bld: 126 mg/dL — ABNORMAL HIGH (ref 70–99)
Potassium: 3.4 mmol/L — ABNORMAL LOW (ref 3.5–5.1)
Sodium: 158 mmol/L — ABNORMAL HIGH (ref 135–145)

## 2020-12-21 LAB — GLUCOSE, CAPILLARY
Glucose-Capillary: 111 mg/dL — ABNORMAL HIGH (ref 70–99)
Glucose-Capillary: 116 mg/dL — ABNORMAL HIGH (ref 70–99)
Glucose-Capillary: 121 mg/dL — ABNORMAL HIGH (ref 70–99)
Glucose-Capillary: 122 mg/dL — ABNORMAL HIGH (ref 70–99)
Glucose-Capillary: 122 mg/dL — ABNORMAL HIGH (ref 70–99)
Glucose-Capillary: 122 mg/dL — ABNORMAL HIGH (ref 70–99)
Glucose-Capillary: 138 mg/dL — ABNORMAL HIGH (ref 70–99)

## 2020-12-21 LAB — LIPID PANEL
Cholesterol: 120 mg/dL (ref 0–200)
HDL: 57 mg/dL (ref >40)
LDL Cholesterol: 48 mg/dL (ref 0–99)
Total CHOL/HDL Ratio: 2.1 RATIO
Triglycerides: 76 mg/dL (ref <150)
VLDL: 15 mg/dL (ref 0–40)

## 2020-12-21 LAB — SODIUM
Sodium: 160 mmol/L — ABNORMAL HIGH (ref 135–145)
Sodium: 158 mmol/L — ABNORMAL HIGH (ref 135–145)
Sodium: 156 mmol/L — ABNORMAL HIGH (ref 135–145)

## 2020-12-21 LAB — CBC
HCT: 33.6 % — ABNORMAL LOW (ref 36.0–46.0)
Hemoglobin: 10.4 g/dL — ABNORMAL LOW (ref 12.0–15.0)
MCH: 31.6 pg (ref 26.0–34.0)
MCHC: 31 g/dL (ref 30.0–36.0)
MCV: 102.1 fL — ABNORMAL HIGH (ref 80.0–100.0)
Platelets: 77 10*3/uL — ABNORMAL LOW (ref 150–400)
RBC: 3.29 MIL/uL — ABNORMAL LOW (ref 3.87–5.11)
RDW: 13.4 % (ref 11.5–15.5)
WBC: 7.2 10*3/uL (ref 4.0–10.5)
nRBC: 0 % (ref 0.0–0.2)

## 2020-12-21 LAB — HEMOGLOBIN A1C
Hgb A1c MFr Bld: 4.9 % (ref 4.8–5.6)
Mean Plasma Glucose: 93.93 mg/dL

## 2020-12-21 LAB — C-REACTIVE PROTEIN: CRP: 2.9 mg/dL — ABNORMAL HIGH (ref <1.0)

## 2020-12-21 MED ORDER — POTASSIUM CHLORIDE CRYS ER 20 MEQ PO TBCR: 40.0000 meq | EXTENDED_RELEASE_TABLET | Freq: Once | ORAL | Status: DC

## 2020-12-21 MED ORDER — HYDROCHLOROTHIAZIDE 25 MG PO TABS
25.0000 mg | ORAL_TABLET | Freq: Every day | ORAL | Status: DC
  Administered 2020-12-21 – 2020-12-24 (×4): 25 mg
  Filled 2020-12-21 (×4): qty 1

## 2020-12-21 MED ORDER — SODIUM CHLORIDE 0.9 % IV SOLN: INTRAVENOUS | Status: DC

## 2020-12-21 MED ORDER — POTASSIUM CHLORIDE 20 MEQ PO PACK
40.0000 meq | PACK | Freq: Once | ORAL | Status: AC
  Administered 2020-12-21: 40 meq
  Filled 2020-12-21: qty 2

## 2020-12-21 MED ORDER — CARVEDILOL 12.5 MG PO TABS
12.5000 mg | ORAL_TABLET | Freq: Two times a day (BID) | ORAL | Status: DC
  Administered 2020-12-21: 12.5 mg

## 2020-12-21 NOTE — Progress Notes (Signed)
SBP greater than 160, HR occasionally dipping into the 40's. Dr. Erlinda Hong made aware. Coreg held. All other antihypertensives given as ordered.

## 2020-12-21 NOTE — Progress Notes (Addendum)
STROKE TEAM PROGRESS NOTE   INTERVAL HISTORY No acute events overnight. Remains intubated, sedated and on cleviprex and propofol infusions. Not ready for SBT today per CCM. Afebrile, Plt 98->71.  Daughter is at bedside. Dr. Erlinda Hong again explained that prognosis is guarded. He advised family to consider goals of care is she does not make progress. Questions were answered.   Vitals:   12/21/20 1301 12/21/20 1330 12/21/20 1400 12/21/20 1515  BP:  (!) 152/67 (!) 155/63 (!) 155/70  Pulse: (!) 107 74 71 100  Resp: (!) $RemoveB'23 18 18 18  'NRODntcv$ Temp:      TempSrc:      SpO2: 93% 95% 96% 98%  Weight:      Height:       CBC:  Recent Labs  Lab 12/12/2020 1329 12/08/2020 1601 12/20/20 0517 12/21/20 0528  WBC 1.9*   < > 5.2 7.2  NEUTROABS 1.0*  --   --   --   HGB 12.0   < > 10.5* 10.4*  HCT 36.9   < > 32.7* 33.6*  MCV 98.9   < > 98.8 102.1*  PLT 61*   < > 98* 77*   < > = values in this interval not displayed.   Basic Metabolic Panel:  Recent Labs  Lab 12/20/20 0516 12/20/20 0756 12/20/20 1153 12/21/20 0528 12/21/20 1024  NA 146*  --    < > 158* 160*  K 3.3*  --   --  3.4*  --   CL 112*  --   --  129*  --   CO2 25  --   --  24  --   GLUCOSE 159*  --   --  126*  --   BUN 10  --   --  10  --   CREATININE 0.96  --   --  0.70  --   CALCIUM 8.0*  --   --  8.0*  --   MG  --  2.0  --   --   --   PHOS  --  3.2  --   --   --    < > = values in this interval not displayed.   Lipid Panel:  Recent Labs  Lab 12/21/20 0528  CHOL 120  TRIG 76  HDL 57  CHOLHDL 2.1  VLDL 15  LDLCALC 48   HgbA1c:  Recent Labs  Lab 12/21/20 0528  HGBA1C 4.9   Urine Drug Screen: No results for input(s): LABOPIA, COCAINSCRNUR, LABBENZ, AMPHETMU, THCU, LABBARB in the last 168 hours.  Alcohol Level No results for input(s): ETH in the last 168 hours.  IMAGING past 24 hours No results found.  PHYSICAL EXAM  General - Well nourished, well developed, intubated on low dose sedation.   Ophthalmologic - fundi not  visualized due to noncooperation.   Cardiovascular - Regular rate and rhythm.   Trying to open eyes but  not able to, agitated with stimulation, + corneal, +gag Not racking not blinking, Pupils pinpoint,  Neuro - intubated on low dose fentanyl, eyes open on pain stimulation, not following commands. With forced eye opening, eyes in mild position, not blinking to visual threat, doll's eyes sluggish, not tracking, b/l pupil pinpoint. Corneal reflex present, gag and cough present. Breathing over the vent.  Facial symmetry not able to test due to ET tube.  Tongue protrusion not cooperative. On pain stimulation, extension of BUEs, right stronger than left, withdraw in BLEs, right barely against gravity and left not able  to against gravity. DTR 1+ and bilateral positive babinski. Sensation, coordination and gait not tested.   ASSESSMENT/PLAN [name] is a 75 y.o. 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 Bone marrow biopsy obtained in 2019 which showed no abnormalities. Also with  remote jugular vein thrombosis s/p lovenox therapy, 2011 Colon cancer s/p colectomy and chemotherapy with associated thrombocytopenia,  leukopenia, and bowel obstruction, postherpetic neuralgia, lumbar DDD, and walking difficulty. She presented to the ED from her ophthalmologist visit where she developed severe left sided weakness, facial droop, and slurred speech around 1030. EMS arrived and found CBG 96 and BP severely elevated at 230/120. Patient was encephalopathic with speech difficulty upon arrival. NIHSS 9. NCT head showed no acute intracranial hemorrhage or ischemic changes. ASPECTS 10. 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 Moderate left intradural vertebral artery and left A2  ACA stenosis. tNK was administered once blood pressure was adequately controlled. She later developed exam decline and was found to have bilateral intraparenchymal hematomas in right thalamus and left lentiform nucleus with intraventricular extension on repeat HCT. She was intubated for airway protection, type & cross and TXA/cryo administered and admitted to the neurologic ICU for further care.   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 ASPECTS is 10 Advanced chronic white matter microangiopathy. Bilateral mastoid effusions.  CTA head & neck  No large vessel occlusion. 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  Repeat CT head 9/16 New acute intracranial hematomas in the right thalamus and left lentiform nucleus with intraventricular extension as above. Mass effect from the right thalamic hemorrhage distorts the third ventricle without evidence of upstream hydrocephalus.   Follow up CT head 9/16 Moderate amount of acute intraventricular blood within the posterior aspects of the lateral ventricles, increased in severity when compared to the prior study. Predominantly stable right thalamic intraparenchymal bleed with persistent mass effect on the third ventricle. Intraparenchymal hematoma centered in the left lentiform nucleus, increased in size when compared to the prior exam. MRI  Unchanged right thalamic and left basal ganglia/internal capsule hemorrhages and small volume intraventricular hemorrhage. Scattered small acute to subacute bilateral cerebral infarcts, largest in the right corona radiata. Severe chronic small vessel ischemic disease with multiple chronic lacunar infarcts and chronic microhemorrhages as  above. 2D Echo  EF 12-75%, Grade I diastolic  dysfunction Left atrial size was mildly dilated.  Left ventricle is hyperdynamic function. No thrombus, wall motion abnormality or shunt found.   LDL 48 HgbA1c 4.9 VTE prophylaxis - on hold  On ASA $Remo'81mg'ZkmiR$  daily prior to admission DNR status with guarded prognosis Therapy recommendations:  pending stability Disposition:  TBD        ICH after tNK S/p TXA/cryo and plt transfusion Hypertonic saline infusion on hold Monitor Na, goal 145-155 Na checks q 6 hours Na 141->146->149->153->158  Neurosurgery consulted: no acute intervention recommended Obtain Stat HCT for any worsening of current neurologic symptoms or decline  Chronic Thrombocytopenia, mild pancytopenia S/p bone marrow biopsy 2019 which was normal S/p plt transfusion Monitor CBC  Plt  61->68->98->77  Acute respiratory failure Intubated for airway protection during neurologic decline Appreciate CCM management  Not appropriate for SBT today per CCM       Recent history of left CRAO Per yesterday's note from Ophthalmologist, Dr. Coralyn Pear, patient with left CRAO in June with stroke and GCA work up recommended. Unclear at present if this happened. Check ESR (?dced and re-ordered) still pending and CRP: CRP was 2.9  Hypertension Severe elevation on admission Now stable on cleviprex infusion Keep systolic BP 962-836 systolic  Long-term BP goal normotensive  Hyperlipidemia Home meds:  None LDL pending, goal < 70 High intensity statin on hold at present Consider statin at discharge        Glycemic Control Home meds:  None, no hx of DM2 HgbA1c Pending goal < 7.0 CBGs q 6 hours         History of seizure On Keppra $RemoveB'500mg'ulDipupy$  BID at home, continued at $RemoveBefo'750mg'kiEnXSxPCdG$  dose  Keppra level PENDING         Feeding/Nutrition Ok to start tube feeding         Chronic Pain On Nucynta q 12 hours at baseline         Dementia On home aricept and Namenda         Other Stroke Risk  Factors Advanced Age >/= 93  Hx DVT  Other Active Problems   Hospital day # 2  This patient was seen and evaluated with Dr. Erlinda Hong. He directed the plan of care.  Evelyn Brooke, NP-C   ATTENDING NOTE: I reviewed above note and agree with the assessment and plan. Pt was seen and examined.   Patient daughter and grandson are at bedside.  Patient still intubated, on low-dose propofol and fentanyl, not candidate for extubation, open eyes with stimulation, however not tracking, not blinking to visual threat, not following commands.  Spontaneously moving right upper extremity barely against gravity, withdraw to pain around right lower extremity, no movement on the left.  Sodium 158, supersensitive discontinue, on normal saline.  Continue monitor sodium level.  Patient heart rate fluctuate, decrease Coreg from 25-12.5.  Still on Cleviprex, added HCTZ, continue amlodipine and losartan, taper off Cleviprex as able.  Platelet today 77, continue to have thrombocytopenia but no need for transfusion.  CRP 2.9, ESR pending.  I had long discussion with again with daughter at bedside, updated pt current condition, treatment plan and potential prognosis, and answered all the questions. She expressed understanding and appreciation.  We will continue monitor patient improvement for the next couple days, if no significant improvement, will need palliative care for [place] discussion.  For detailed assessment and plan, please refer to above as I have made changes wherever appropriate.   This patient is critically ill due to embolic stroke, hemorrhagic conversion after thrombolytics, respiratory failure, hypertensive emergency, thrombocytopenia and at significant risk of neurological worsening, death form bleeding from thrombocytopenia, recurrent stroke, brain herniation, hematoma expansion, seizure, encephalopathy. This patient's care requires constant monitoring of vital signs, hemodynamics, respiratory and  cardiac monitoring, review of multiple databases, neurological assessment, discussion with family, other specialists and medical decision making of high complexity. I spent 40 minutes of neurocritical care time in the care of this patient.  Rosalin Hawking, MD PhD Stroke Neurology 12/21/2020 7:35 PM    To contact Stroke Continuity provider, please refer to [email or web address] After hours, contact General Neurology

## 2020-12-21 NOTE — Progress Notes (Signed)
NAME:  [name], MRN:  [id number], DOB:  01-30-1946, LOS: 2 ADMISSION DATE:  12/28/2020, CONSULTATION DATE:  12/31/2020 REFERRING MD:  Lynnae Sandhoff MD , CHIEF COMPLAINT:   Stoke  History of Present Illness:  57 Y?O with H/O colon cancer, DVT, HTN Admitted with acute stroke give lytics in ED, intubated for airway protection. 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   Significant Hospital Events: Including procedures, antibiotic start and stop dates in addition to other pertinent events   9/16 admitted for stroke.  Developed intracranial hemorrhage after lytics  Interim History / Subjective:  Sedation held yesterday afternoon, put back on early this morning for movement of the RUE.   Objective   Blood pressure (!) 134/50, pulse (!) 55, temperature 100 F (37.8 C), temperature source Axillary, resp. rate 18, height $RemoveBe'5\' 2"'YsThFxSHY$  (1.575 m), weight 63.8 kg, SpO2 97 %.    Vent Mode: PRVC FiO2 (%):  [30 %] 30 % Set Rate:  [18 bmp] 18 bmp Vt Set:  [400 mL] 400 mL PEEP:  [5 cmH20] 5 cmH20 Plateau Pressure:  [10 cmH20-15 cmH20] 15 cmH20   Intake/Output Summary (Last 24 hours) at 12/21/2020 1443 Last data filed at 12/21/2020 0900 Gross per 24 hour  Intake 2668.4 ml  Output 1050 ml  Net 1618.4 ml   Filed Weights   12/14/2020 1200  Weight: 63.8 kg    Examination: Gen:      No acute distress, elderly female, intubated, sedated HEENT:  ETT to vent,  pupils 1-2 mm and minimally reactive Lungs:    Clear to auscultation bilaterally; normal respiratory effort CV:         tachycardic, regular Abd:      Soft, nontender Ext:    No edema; adequate peripheral perfusion Skin:      Warm and dry; no rash Neuro: Right sided hemiparesis, does not follow commands, +cough and gag  Lab/imaging reviewed Na 158 K 3.4 Cr 0.7  CT head repeat this morning shows stable and unchanged ICH, no midline shift of hydrocephalus. Severe chronic small vessel disease.   Resolved Hospital Problem list    Assessment & Plan:  [place]ke ICH afer TPA Underlying Dementia Seizure Disorder Continue 3% Na with goal Na 145-155. Held this morning due to na 158 Follow Na q6 Cleveprex for SBP < 140. Adding additional anti-htns today to titrate this down.  [place] Neurosurgery consulted by primary team - no role for EVD at this time since no hydrocephalus MRI shows unchanged right thalamic and right basal ganglia/internal capsule hemorrhage. Chronic small vessel changes and chronic microhemorrhages  Acute resp failure, failure to protect airway due to stoke Asthma Follow CXR, ABG Nebs PRN Not appropriate for SBT today.   Leukemia, thrombocytopenia This is a chronic issue with normal bone marrow biopsy in 2019.  Exacerbated by critical illness Follow CBC. Consider platelet transfusion if count drops <50K  Best Practice (right click and "Reselect all SmartList Selections" daily)   Diet/type: NPO DVT prophylaxis: not indicated GI prophylaxis: PPI Lines: Central line Foley:  Yes,  and it is still needed Code Status:  DNR Last date of multidisciplinary goals of care discussion talked again with daughter and son in law at bedside. They would like to speak with neurology before making any final decision, but are leaning towards comfort measures.   Labs   CBC: Recent Labs  Lab 12/18/2020 1329 12/11/2020 1601 12/06/2020 2146 12/20/20 0517 12/21/20 0528  WBC 1.9*  --  6.9  5.2 7.2  NEUTROABS 1.0*  --   --   --   --   HGB 12.0 12.9 11.5* 10.5* 10.4*  HCT 36.9 38.0 35.1* 32.7* 33.6*  MCV 98.9  --  96.7 98.8 102.1*  PLT 61*  --  68* 98* 77*    Basic Metabolic Panel: Recent Labs  Lab 12/06/2020 1229 12/04/2020 1329 12/23/2020 1601 12/21/2020 2124 12/20/20 0516 12/20/20 0756 12/20/20 1153 12/20/20 1801 12/20/20 2108 12/21/20 0528  NA 141 138 141   < > 146*  --  149* 153* 153* 158*  K 3.7 3.5 3.0*  --  3.3*  --   --   --   --  3.4*  CL 103 106  --   --  112*  --   --   --   --  129*  CO2  --  29  --   --  25  --   --   --   --  24  GLUCOSE 94 94  --   --  159*  --   --   --   --  126*  BUN 5* 6*  --   --  10  --   --   --   --  10  CREATININE 0.70 0.80  --   --  0.96  --   --   --   --  0.70  CALCIUM  --  8.4*  --   --  8.0*  --   --   --   --  8.0*  MG  --   --   --   --   --  2.0  --   --   --   --   PHOS  --   --   --   --   --  3.2  --   --   --   --    < > = values in this interval not displayed.   GFR: Estimated Creatinine Clearance: 51.7 mL/min (by C-G formula based on SCr of 0.7 mg/dL). Recent Labs  Lab 12/23/2020 1329 01/01/2021 2146 12/20/20 0517 12/21/20 0528  WBC 1.9* 6.9 5.2 7.2    Liver Function Tests: Recent Labs  Lab 12/05/2020 1329 12/20/20 0516  AST 16 18  ALT 8 9  ALKPHOS 48 45  BILITOT 0.4 0.4  PROT 6.0* 5.5*  ALBUMIN 2.9* 2.7*   No results for input(s): LIPASE, AMYLASE in the last 168 hours. No results for input(s): AMMONIA in the last 168 hours.  ABG    Component Value Date/Time   PHART 7.405 12/10/2020 1601   PCO2ART 46.4 12/06/2020 1601   PO2ART 61 (L) 12/28/2020 1601   HCO3 29.1 (H) 12/28/2020 1601   TCO2 30 12/08/2020 1601   O2SAT 91.0 12/12/2020 1601     Coagulation Profile: Recent Labs  Lab 12/23/2020 1329  INR 1.0    Cardiac Enzymes: No results for input(s): CKTOTAL, CKMB, CKMBINDEX, TROPONINI in the last 168 hours.  HbA1C: Hgb A1c MFr Bld  Date/Time Value Ref Range Status  12/21/2020 05:28  AM  4.9 4.8 - 5.6 % Final    Comment:    (NOTE) Pre diabetes:          5.7%-6.4%  Diabetes:              >6.4%  Glycemic control for   <7.0% adults with diabetes     CBG: Recent Labs  Lab 12/15/2020 1222 12/20/20 2019 12/21/20 0022 12/21/20 0414 12/21/20 0748  GLUCAP 106* 131* 121* 116* 122*     Critical care time:    The patient is critically ill due to [place], stroke, respiratory failure.  Critical care was necessary to treat or prevent imminent or life-threatening deterioration.  Critical care was time spent personally by me on the following activities: development of treatment plan with patient and/or surrogate as well as nursing, discussions with consultants, evaluation of patient's response to treatment, examination of patient, obtaining history from patient or surrogate, ordering and performing treatments and interventions, ordering and review of laboratory studies, ordering and review of radiographic studies, pulse oximetry, re-evaluation of patient's condition and participation in multidisciplinary rounds.   Critical Care Time devoted to patient care services described in this note is 35 minutes. This time reflects time of care of this [place] . This critical care time does not reflect separately billable procedures or procedure time, teaching time or supervisory time of PA/NP/Med student/Med Resident etc but could involve care discussion time.       Spero Geralds  Pulmonary and Critical Care Medicine 12/21/2020 9:21 AM  Pager: see AMION  If no response to pager , please call critical care on call (see AMION) until 7pm After 7:00 pm call [place]

## 2020-12-21 NOTE — Progress Notes (Signed)
OT Cancellation Note  Patient Details Name: [name] MRN: [id number] DOB: 02/27/1946   Cancelled Treatment:    Reason Eval/Treat Not Completed: Active bedrest order. pt remains ventilated, sedated, and on bedrest. Not following commands per neuro note. Will continue to monitor for appropriateness of OT evaluation  Jaci Carrel 12/21/2020, 7:43 AM  [facility] Pager: [phone] Office: [phone]

## 2020-12-21 NOTE — Progress Notes (Signed)
PT Cancellation Note  Patient Details Name: [name] MRN: [id number] DOB: [birth year]   Cancelled Treatment:    Reason Eval/Treat Not Completed: Patient not medically ready;Active bedrest order  Noted pt remains ventilated, sedated, and on bedrest. Not following commands per neuro note. Will continue to monitor for appropriateness of PT evaluation.    Arby Barrette, PT Pager [phone]  Rexanne Mano 12/21/2020, 7:22 AM

## 2020-12-21 NOTE — Progress Notes (Signed)
CCM on rounds, made aware of K+ and platelets; orders noted.

## 2020-12-21 NOTE — Progress Notes (Signed)
SLP Cancellation Note  Patient Details Name: [name] MRN: [id number] DOB: 06/15/1945   Cancelled treatment:       Reason Eval/Treat Not Completed: Medical issues which prohibited therapy;Other (comment) (patient continues to be on ventilator and sedated. Will follow for readiness)   Sonia Baller, MA, CCC-SLP Speech Therapy

## 2020-12-22 DIAGNOSIS — J9601 Acute respiratory failure with hypoxia: Secondary | ICD-10-CM | POA: Diagnosis not present

## 2020-12-22 DIAGNOSIS — I619 Nontraumatic intracerebral hemorrhage, unspecified: Secondary | ICD-10-CM

## 2020-12-22 DIAGNOSIS — I63413 Cerebral infarction due to embolism of bilateral middle cerebral arteries: Secondary | ICD-10-CM | POA: Diagnosis not present

## 2020-12-22 LAB — GLUCOSE, CAPILLARY
Glucose-Capillary: 123 mg/dL — ABNORMAL HIGH (ref 70–99)
Glucose-Capillary: 101 mg/dL — ABNORMAL HIGH (ref 70–99)
Glucose-Capillary: 117 mg/dL — ABNORMAL HIGH (ref 70–99)
Glucose-Capillary: 114 mg/dL — ABNORMAL HIGH (ref 70–99)
Glucose-Capillary: 152 mg/dL — ABNORMAL HIGH (ref 70–99)
Glucose-Capillary: 134 mg/dL — ABNORMAL HIGH (ref 70–99)

## 2020-12-22 LAB — SEDIMENTATION RATE: Sed Rate: 25 mm/hr — ABNORMAL HIGH (ref 0–22)

## 2020-12-22 LAB — BASIC METABOLIC PANEL
Anion gap: 9 (ref 5–15)
BUN: 10 mg/dL (ref 8–23)
CO2: 24 mmol/L (ref 22–32)
Calcium: 8.3 mg/dL — ABNORMAL LOW (ref 8.9–10.3)
Chloride: 121 mmol/L — ABNORMAL HIGH (ref 98–111)
Creatinine, Ser: 0.66 mg/dL (ref 0.44–1.00)
GFR, Estimated: >60 mL/min (ref >60)
Glucose, Bld: 118 mg/dL — ABNORMAL HIGH (ref 70–99)
Potassium: 2.9 mmol/L — ABNORMAL LOW (ref 3.5–5.1)
Sodium: 154 mmol/L — ABNORMAL HIGH (ref 135–145)

## 2020-12-22 LAB — CBC
HCT: 32.9 % — ABNORMAL LOW (ref 36.0–46.0)
Hemoglobin: 10.4 g/dL — ABNORMAL LOW (ref 12.0–15.0)
MCH: 31.9 pg (ref 26.0–34.0)
MCHC: 31.6 g/dL (ref 30.0–36.0)
MCV: 100.9 fL — ABNORMAL HIGH (ref 80.0–100.0)
Platelets: 45 10*3/uL — ABNORMAL LOW (ref 150–400)
RBC: 3.26 MIL/uL — ABNORMAL LOW (ref 3.87–5.11)
RDW: 13.5 % (ref 11.5–15.5)
WBC: 7.3 10*3/uL (ref 4.0–10.5)
nRBC: 0 % (ref 0.0–0.2)

## 2020-12-22 LAB — SODIUM
Sodium: 154 mmol/L — ABNORMAL HIGH (ref 135–145)
Sodium: 154 mmol/L — ABNORMAL HIGH (ref 135–145)

## 2020-12-22 LAB — PHOSPHORUS: Phosphorus: 2.1 mg/dL — ABNORMAL LOW (ref 2.5–4.6)

## 2020-12-22 LAB — MAGNESIUM: Magnesium: 2.1 mg/dL (ref 1.7–2.4)

## 2020-12-22 MED ORDER — POTASSIUM CHLORIDE 20 MEQ PO PACK
40.0000 meq | PACK | Freq: Once | ORAL | Status: AC
  Administered 2020-12-22: 40 meq
  Filled 2020-12-22: qty 2

## 2020-12-22 MED ORDER — PROSOURCE TF PO LIQD
45.0000 mL | Freq: Two times a day (BID) | ORAL | Status: DC
  Administered 2020-12-22 – 2020-12-23 (×2): 45 mL
  Filled 2020-12-22 (×2): qty 45

## 2020-12-22 MED ORDER — POTASSIUM CHLORIDE 10 MEQ/50ML IV SOLN
10.0000 meq | INTRAVENOUS | Status: AC
  Administered 2020-12-22 (×4): 10 meq via INTRAVENOUS
  Filled 2020-12-22 (×4): qty 50

## 2020-12-22 MED ORDER — CLONIDINE HCL 0.2 MG PO TABS
0.2000 mg | ORAL_TABLET | Freq: Two times a day (BID) | ORAL | Status: DC
  Administered 2020-12-22 – 2020-12-24 (×5): 0.2 mg
  Filled 2020-12-22 (×5): qty 1

## 2020-12-22 MED ORDER — VITAL HIGH PROTEIN PO LIQD
1000.0000 mL | ORAL | Status: AC
Start: 2020-12-22 — End: 2020-12-23
  Administered 2020-12-22: 1000 mL

## 2020-12-22 NOTE — Progress Notes (Signed)
OT Cancellation Note  Patient Details Name: MELANEE CORDIAL MRN: [id number] DOB: Mar 25, 1946   Cancelled Treatment:    Reason Eval/Treat Not Completed: Active bedrest order (sedated, intubated, bedrest orders active)  [name], OTR/L  Acute Rehabilitation Services Pager: [phone] Office: [phone] .  12/22/2020, 7:54 AM

## 2020-12-22 NOTE — Progress Notes (Signed)
AM K+ 2.9 with creat 0.66 and GFR > 60. CCM ELink electrolyte protocol initiated.

## 2020-12-22 NOTE — Progress Notes (Signed)
PT Cancellation Note  Patient Details Name: [name] MRN: [id number] DOB: 11/24/1945   Cancelled Treatment:    Reason Eval/Treat Not Completed: Patient not medically ready;Active bedrest order. Pt intubated and sedated. Will keep checking back for medical stability.   Leighton Roach, [place]  Pager [phone] Office [place] 12/22/2020, 8:59 AM

## 2020-12-22 NOTE — Progress Notes (Signed)
NAME:  [name], MRN:  [id number], DOB:  1945-07-05, LOS: 3 ADMISSION DATE:  12/06/2020, CONSULTATION DATE:  12/26/2020 REFERRING MD:  Marisue Humble MD , CHIEF COMPLAINT:   Stoke  History of Present Illness:  75 Y O with H/O colon cancer, DVT, HTN Admitted with acute stroke give lytics in ED, intubated for airway protection. BP on admission was high at 230/120 Then developed ICH with bilateral intraparenchymal hematomas in right thalamus and left lentiform nucleus with intraventricular extension , given cryo and tranexamic acid  Significant Hospital Events: Including procedures, antibiotic start and stop dates in addition to other pertinent events   9/16 admitted for stroke.  Developed intracranial hemorrhage after lytics  Interim History / Subjective:  Blood pressure remains elevated, not at the goal Started having thick copious amount of secretions through ET tube Placed on pressure support trial, tolerating it well so far  Objective   Blood pressure (!) 141/60, pulse 71, temperature 97.9 F (36.6 C), temperature source Axillary, resp. rate 18, height 5\' 2"  (1.575 m), weight 63.8 kg, SpO2 99 %.    Vent Mode: PRVC FiO2 (%):  [30 %] 30 % Set Rate:  [18 bmp] 18 bmp Vt Set:  [400 mL] 400 mL PEEP:  [5 cmH20] 5 cmH20 Plateau Pressure:  [12 cmH20-17 cmH20] 16 cmH20   Intake/Output Summary (Last 24 hours) at 12/22/2020 0818 Last data filed at 12/22/2020 0759 Gross per 24 hour  Intake 1773.98 ml  Output 1200 ml  Net 573.98 ml   Filed Weights   12/18/2020 1200  Weight: 63.8 kg    Examination:   Physical exam: General: Crtitically ill-appearing elderly African-American female, orally intubated HEENT: [place]/AT, eyes anicteric. ETT and OGT in place.  Large tongue Neuro: Opens eyes with vocal stimuli, not following commands, pupils 3 mm bilateral reactive to light, leftward gaze deviation, does not cross midline Chest: Coarse breath sounds, no wheezes or rhonchi Heart: Regular  rate and rhythm, no murmurs or gallops Abdomen: Soft, nontender, nondistended, bowel sounds present Skin: No rash  Resolved Hospital Problem list    Assessment & Plan:  Acute [place]oke Bilateral intraparenchymal hemorrhage post tPA Cerebral edema Induced hypernatremia Underlying Dementia Seizure Disorder 3% saline was stopped yesterday as serum sodium went up to 160 Currently serum sodium at 154,  with goal Na 145-155.  Monitor serum sodium every 6 hours Off antiplatelet therapy SBP goal SBP < 140, blood pressure is not at the goal Continue clevidipine, titrate titrated down Started on oral antihypertensives including amlodipine 10 mg daily, HCTZ 25 mg once daily, losartan 50 mg twice daily.  Clonidine 0.2 mg 3 times daily added this morning Continue Keppra Keppra Neurosurgery consulted by primary team - no role for EVD at this time since no hydrocephalus MRI shows unchanged right thalamic and right basal ganglia/internal capsule hemorrhage. Chronic small vessel changes and chronic microhemorrhages  Acute hypoxic respiratory failure Patient is having very thick secretions through ET tube She is afebrile, white count has trended down Will send respiratory culture She is tolerating spontaneous breathing trial, watch for respiratory distress  Hypokalemia Continue aggressive electrolyte supplement and monitor  Thrombocytopenia This is a chronic issue with normal bone marrow biopsy in 2019.  Exacerbated by critical illness Follow CBC.  Best Practice (right click and "Reselect all SmartList Selections" daily)   Diet/type: NPO tube feeds DVT prophylaxis: not indicated GI prophylaxis: PPI Lines: Central line, will discontinue Foley:  Yes, and it is still needed Code Status:  DNR Last date  of multidisciplinary goals of care discussion: Per primary team  Labs   CBC: Recent Labs  Lab 12/26/2020 1329 12/22/2020 1601 12/27/2020 2146 12/20/20 0517 12/21/20 0528 12/22/20 0513  WBC  1.9*  --  6.9 5.2 7.2 7.3  NEUTROABS 1.0*  --   --   --   --   --   HGB 12.0 12.9 11.5* 10.5* 10.4* 10.4*  HCT 36.9 38.0 35.1* 32.7* 33.6* 32.9*  MCV 98.9  --  96.7 98.8 102.1* 100.9*  PLT 61*  --  68* 98* 77* 45*    Basic Metabolic Panel: Recent Labs  Lab 01/02/2021 1229 12/10/2020 1329 12/14/2020 1601 12/17/2020 2124 12/20/20 0516 12/20/20 0756 12/20/20 1153 12/21/20 0528 12/21/20 1024 12/21/20 1640 12/21/20 2247 12/22/20 0513  NA 141 138 141   < > 146*  --    < > 158* 160* 158* 156* 154*  K 3.7 3.5 3.0*  --  3.3*  --   --  3.4*  --   --   --  2.9*  CL 103 106  --   --  112*  --   --  129*  --   --   --  121*  CO2  --  29  --   --  25  --   --  24  --   --   --  24  GLUCOSE 94 94  --   --  159*  --   --  126*  --   --   --  118*  BUN 5* 6*  --   --  10  --   --  10  --   --   --  10  CREATININE 0.70 0.80  --   --  0.96  --   --  0.70  --   --   --  0.66  CALCIUM  --  8.4*  --   --  8.0*  --   --  8.0*  --   --   --  8.3*  MG  --   --   --   --   --  2.0  --   --   --   --   --   --   PHOS  --   --   --   --   --  3.2  --   --   --   --   --   --    < > = values in this interval not displayed.   GFR: Estimated Creatinine Clearance: 51.7 mL/min (by C-G formula based on SCr of 0.66 mg/dL). Recent Labs  Lab 12/15/2020 2146 12/20/20 0517 12/21/20 0528 12/22/20 0513  WBC 6.9 5.2 7.2 7.3    Liver Function Tests: Recent Labs  Lab 12/22/2020 1329 12/20/20 0516  AST 16 18  ALT 8 9  ALKPHOS 48 45  BILITOT 0.4 0.4  PROT 6.0* 5.5*  ALBUMIN 2.9* 2.7*   No results for input(s): LIPASE, AMYLASE in the last 168 hours. No results for input(s): AMMONIA in the last 168 hours.  ABG    Component Value Date/Time   PHART 7.405 01/01/2021 1601   PCO2ART 46.4 12/08/2020 1601   PO2ART 61 (L) 12/04/2020 1601   HCO3 29.1 (H) 01/02/2021 1601   TCO2 30 12/21/2020 1601   O2SAT 91.0 12/20/2020 1601     Coagulation Profile: Recent Labs  Lab 12/20/2020 1329  INR 1.0    Cardiac  Enzymes: No results for input(s): CKTOTAL,  CKMB, CKMBINDEX, TROPONINI in the last 168 hours.  HbA1C: Hgb A1c MFr Bld  Date/Time Value Ref Range Status  12/21/2020 05:28 AM 4.9 4.8 - 5.6 % Final    Comment:    (NOTE) Pre diabetes:          5.7%-6.4%  Diabetes:              >6.4%  Glycemic control for   <7.0% adults with diabetes     CBG: Recent Labs  Lab 12/21/20 1614 12/21/20 1932 12/21/20 2310 12/22/20 0314 12/22/20 0730  GLUCAP 122* 122* 138* 123* 101*     Total critical care time: 44 minutes  Performed by: [facility] care time was exclusive of separately billable procedures and treating other patients.   Critical care was necessary to treat or prevent imminent or life-threatening deterioration.   Critical care was time spent personally by me on the following activities: development of treatment plan with patient and/or surrogate as well as nursing, discussions with consultants, evaluation of patient's response to treatment, examination of patient, obtaining history from patient or surrogate, ordering and performing treatments and interventions, ordering and review of laboratory studies, ordering and review of radiographic studies, pulse oximetry and re-evaluation of patient's condition.   Jacky Kindle MD [place] See Amion for pager If no response to pager, please call [phone] until 7pm After 7pm, Please call E-link [phone]

## 2020-12-22 NOTE — Progress Notes (Addendum)
STROKE TEAM PROGRESS NOTE   INTERVAL HISTORY No acute events overnight. Remains intubated, sedated and on cleviprex and fentanyl and propofol infusions. Unable to wean per bedside RN. PLT  downtrending to 45.  Sister is at bedside.   Dr. Erlinda Hong again explained that prognosis is guarded and may need to call in palliative care. He advised family to consider goals of care is she does not make progress. Questions were answered. CCM is planning discussion with daughter today for goals of care.   Vitals:   12/22/20 0830 12/22/20 0831 12/22/20 0900 12/22/20 0930  BP: (!) 152/95 (!) 165/67 (!) 152/71 (!) 150/62  Pulse: 98 88 89 79  Resp: $Remo'18 15 15 16  'QyCNi$ Temp:      TempSrc:      SpO2: 98% 100% 98% 97%  Weight:      Height:       CBC:  Recent Labs  Lab 12/07/2020 1329 12/14/2020 1601 12/21/20 0528 12/22/20 0513  WBC 1.9*   < > 7.2 7.3  NEUTROABS 1.0*  --   --   --   HGB 12.0   < > 10.4* 10.4*  HCT 36.9   < > 33.6* 32.9*  MCV 98.9   < > 102.1* 100.9*  PLT 61*   < > 77* 45*   < > = values in this interval not displayed.   Basic Metabolic Panel:  Recent Labs  Lab 12/20/20 0756 12/20/20 1153 12/21/20 0528 12/21/20 1024 12/21/20 2247 12/22/20 0513  NA  --    < > 158*   < > 156* 154*  K  --   --  3.4*  --   --  2.9*  CL  --   --  129*  --   --  121*  CO2  --   --  24  --   --  24  GLUCOSE  --   --  126*  --   --  118*  BUN  --   --  10  --   --  10  CREATININE  --   --  0.70  --   --  0.66  CALCIUM  --   --  8.0*  --   --  8.3*  MG 2.0  --   --   --   --   --   PHOS 3.2  --   --   --   --   --    < > = values in this interval not displayed.   Lipid Panel:  Recent Labs  Lab 12/21/20 0528  CHOL 120  TRIG 76  HDL 57  CHOLHDL 2.1  VLDL 15  LDLCALC 48   HgbA1c:  Recent Labs  Lab 12/21/20 0528  HGBA1C 4.9   Urine Drug Screen: No results for input(s): LABOPIA, COCAINSCRNUR, LABBENZ, AMPHETMU, THCU, LABBARB in the last 168 hours.  Alcohol Level No results for input(s): ETH in the  last 168 hours.  IMAGING past 24 hours No results found.  PHYSICAL EXAM  General - Well nourished, well developed, intubated on low dose sedation.   Ophthalmologic - fundi not visualized due to noncooperation.   Cardiovascular - Regular rate and rhythm.   Minimal eye opening with tactile stimulation agitated with stimulation, + corneal, +gag Not racking not blinking, Pupils pinpoint,  Neuro - intubated on low dose fentanyl, eyes open on pain stimulation, not following commands. With forced eye opening, eyes in mild position, not blinking to visual threat, doll's eyes sluggish,  not tracking, b/l pupil pinpoint. Corneal reflex present, gag and cough present. Breathing over the vent.  Facial symmetry not able to test due to ET tube.  Tongue protrusion not cooperative. On pain stimulation, extension of BUEs, right stronger than left, withdraw in BLEs, right barely against gravity and left not able to against gravity. DTR 1+ and bilateral positive babinski. Sensation, coordination and gait not tested.   ASSESSMENT/PLAN [name] is a 75 y.o. 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 Bone marrow biopsy obtained in 2019 which showed no abnormalities. Also with  remote jugular vein thrombosis s/p lovenox therapy, 2011 Colon cancer s/p colectomy and chemotherapy with associated thrombocytopenia,  leukopenia, and bowel obstruction, postherpetic neuralgia, lumbar DDD, and walking difficulty. She presented to the ED from her ophthalmologist visit where she developed severe left sided weakness, facial droop, and slurred speech around 1030. EMS arrived and found CBG 96 and BP severely elevated at 230/120. Patient was encephalopathic with speech difficulty upon arrival. NIHSS 9. NCT head showed no acute intracranial hemorrhage or ischemic changes.  ASPECTS 10. 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 tNK was administered once blood pressure was adequately controlled. She later developed exam decline and was found to have bilateral intraparenchymal hematomas in right thalamus and left lentiform nucleus with intraventricular extension on repeat HCT. She was intubated for airway protection, type & cross and TXA/cryo administered and admitted to the neurologic ICU for further care.   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 CTA head and neck No large vessel occlusion. 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 Repeat CT head 9/16 New acute intracranial hematomas in the right thalamus and left lentiform nucleus with intraventricular extension as above. Mass effect from the right thalamic hemorrhage distorts the third ventricle without evidence of upstream hydrocephalus.  Follow up CT head 9/16 Moderate amount of acute intraventricular blood within the posterior aspects of the lateral ventricles, increased in severity when compared to the prior study. Predominantly stable right thalamic intraparenchymal bleed with persistent mass effect on the third ventricle. Intraparenchymal hematoma centered in the left lentiform nucleus, increased in size when compared to the prior exam. MRI Unchanged right thalamic and left basal ganglia/internal capsule hemorrhages and small volume intraventricular hemorrhage. Scattered small acute to subacute bilateral cerebral infarcts, largest in the right corona radiata. 2D Echo EF 70-75% LDL 48 HgbA1c 4.9 VTE  prophylaxis - on hold  On ASA $Remo'81mg'HLPwZ$  daily prior to admission, not on antithrombotics due to [place] Therapy recommendations:  pending Disposition:  TBD, DNR status with guarded prognosis, Dr. Tacy Learn to discuss with daughter today  ICH post tNK Cerebral edema S/p TXA/cryo and plt transfusion 3% saline infusion -> NS -> off Monitor Na, goal 145-155 Na checks q 6 hours Na 141->146->149->153->158->156->154 Neurosurgery consulted: no acute intervention recommended Repeat CT in am  Chronic Thrombocytopenia, mild pancytopenia S/p bone marrow biopsy 2019 which was normal S/p plt transfusion Monitor CBC  Plt 61->68->98->77->45 (Discussed with Dr. Tacy Learn, continue to  monitor for now)  Acute respiratory failure Intubated for airway protection during neurologic decline Appreciate CCM management  On SBT today per CCM On low dose fentanyl Dr. Tacy Learn will discuss with daughter regarding one way extubation vs. Trach Palliative care if needed.       Recent history of left CRAO Per note from Ophthalmologist, Dr. Coralyn Pear, patient with left CRAO in June with stroke and GCA work up recommended. Unclear at present if this happened. CTA head and neck no carotid stenosis ESR 25 and CRP 2.9 - no concerning for GCA at this time  Hypertension Severe elevation on admission Coreg d/c'ed due to bradycardia yesterday Still on Cleviprex, taper off Cleviprex as able.  Continue HCTZ, amlodipine and losartan  CCM added clonidine Keep systolic BP < 415 Long-term BP goal normotensive  Hyperlipidemia Home meds:  None LDL 48,  at goal < 70 High intensity statin on hold at present due to [place] and LDL at goal Consider statin at discharge        History of seizure On Keppra $RemoveB'500mg'xvjLggaP$  BID at home, continued at $RemoveBefo'750mg'HRxsaiiNrEp$  dose  Keppra level PENDING         Feeding/Nutrition Will start tube feeding        Chronic Pain On Nucynta q 12 hours at baseline         Dementia On home aricept and Namenda      Other Stroke Risk  Factors Advanced Age >/= 46  Hx DVT  Other Active Problems Hypokalemia, K 3.4->2.9, supplement  Hospital day # 3  This patient was seen and evaluated with Dr. Erlinda Hong. He directed the plan of care.  Charlene Brooke, NP-C  10:05 AM  ATTENDING NOTE: I reviewed above note and agree with the assessment and plan. Pt was seen and examined.   Patient sister and RN are at bedside.  Patient still intubated, however on weaning trials this morning.  Still not follow commands, but able to open eyes more easily with voice and pain than yesterday, still not tracking or blinking to visual threat.  However with pain stimulation, her right arm and leg movement more active than yesterday.  Still has left arm plegia, however, left lower extremity mild withdraw to pain.  Still on Cleviprex, Coreg discontinued due to bradycardia, added clonidine by CCM, continue amlodipine, HCTZ and losartan.  We will repeat CT in a.m., and start TF. Dr. Tacy Learn is going to talk to daughter regarding one-way intubation versus tracheostomy.  Palliative care will be consulted if needed.  For detailed assessment and plan, please refer to above as I have made changes wherever appropriate.   Rosalin Hawking, MD PhD Stroke Neurology 12/22/2020 4:27 PM  This patient is critically ill due to embolic stroke, hemorrhagic conversion after thrombolytics, respirate failure, hypertensive emergency, thrombocytopenia and at significant risk of neurological worsening, death form recurrent stroke, bleeding from thrombocytopenia, brain herniation, hematoma expansion, seizure, encephalopathy. This patient's care requires constant monitoring of vital signs, hemodynamics, respiratory and cardiac monitoring, review of multiple databases, neurological assessment, discussion with family, other specialists and medical decision making of high complexity. I spent 35 minutes of neurocritical care time in the care of this patient.  I discussed with Dr. Tacy Learn  CCM.    To contact Stroke Continuity provider, please refer to [email or web address] After hours, contact General Neurology

## 2020-12-23 ENCOUNTER — Inpatient Hospital Stay (HOSPITAL_COMMUNITY): Payer: Medicare (Managed Care)

## 2020-12-23 DIAGNOSIS — I63413 Cerebral infarction due to embolism of bilateral middle cerebral arteries: Secondary | ICD-10-CM | POA: Diagnosis not present

## 2020-12-23 DIAGNOSIS — I674 Hypertensive encephalopathy: Secondary | ICD-10-CM

## 2020-12-23 DIAGNOSIS — J9601 Acute respiratory failure with hypoxia: Secondary | ICD-10-CM | POA: Diagnosis not present

## 2020-12-23 DIAGNOSIS — I610 Nontraumatic intracerebral hemorrhage in hemisphere, subcortical: Secondary | ICD-10-CM

## 2020-12-23 LAB — BPAM RBC
Blood Product Expiration Date: 202209282359
Blood Product Expiration Date: 202209282359
Unit Type and Rh: 6200
Unit Type and Rh: 6200

## 2020-12-23 LAB — TYPE AND SCREEN
ABO/RH(D): A POS
Antibody Screen: POSITIVE
Donor AG Type: NEGATIVE
Donor AG Type: NEGATIVE
Unit division: 0
Unit division: 0

## 2020-12-23 LAB — CBC
HCT: 32 % — ABNORMAL LOW (ref 36.0–46.0)
Hemoglobin: 9.9 g/dL — ABNORMAL LOW (ref 12.0–15.0)
MCH: 31.5 pg (ref 26.0–34.0)
MCHC: 30.9 g/dL (ref 30.0–36.0)
MCV: 101.9 fL — ABNORMAL HIGH (ref 80.0–100.0)
Platelets: 30 10*3/uL — ABNORMAL LOW (ref 150–400)
RBC: 3.14 MIL/uL — ABNORMAL LOW (ref 3.87–5.11)
RDW: 13.2 % (ref 11.5–15.5)
WBC: 6.3 10*3/uL (ref 4.0–10.5)
nRBC: 0 % (ref 0.0–0.2)

## 2020-12-23 LAB — GLUCOSE, CAPILLARY
Glucose-Capillary: 113 mg/dL — ABNORMAL HIGH (ref 70–99)
Glucose-Capillary: 138 mg/dL — ABNORMAL HIGH (ref 70–99)
Glucose-Capillary: 128 mg/dL — ABNORMAL HIGH (ref 70–99)
Glucose-Capillary: 119 mg/dL — ABNORMAL HIGH (ref 70–99)
Glucose-Capillary: 117 mg/dL — ABNORMAL HIGH (ref 70–99)
Glucose-Capillary: 150 mg/dL — ABNORMAL HIGH (ref 70–99)

## 2020-12-23 LAB — MAGNESIUM
Magnesium: 2 mg/dL (ref 1.7–2.4)
Magnesium: 1.9 mg/dL (ref 1.7–2.4)

## 2020-12-23 LAB — PHOSPHORUS
Phosphorus: 3.2 mg/dL (ref 2.5–4.6)
Phosphorus: 3.1 mg/dL (ref 2.5–4.6)

## 2020-12-23 LAB — BASIC METABOLIC PANEL
Anion gap: 5 (ref 5–15)
BUN: 22 mg/dL (ref 8–23)
CO2: 25 mmol/L (ref 22–32)
Calcium: 8.4 mg/dL — ABNORMAL LOW (ref 8.9–10.3)
Chloride: 122 mmol/L — ABNORMAL HIGH (ref 98–111)
Creatinine, Ser: 0.73 mg/dL (ref 0.44–1.00)
GFR, Estimated: >60 mL/min (ref >60)
Glucose, Bld: 134 mg/dL — ABNORMAL HIGH (ref 70–99)
Potassium: 3.4 mmol/L — ABNORMAL LOW (ref 3.5–5.1)
Sodium: 152 mmol/L — ABNORMAL HIGH (ref 135–145)

## 2020-12-23 LAB — LEVETIRACETAM LEVEL: Levetiracetam Lvl: 8.6 ug/mL — ABNORMAL LOW (ref 10.0–40.0)

## 2020-12-23 LAB — TRIGLYCERIDES: Triglycerides: 69 mg/dL (ref <150)

## 2020-12-23 LAB — SODIUM
Sodium: 147 mmol/L — ABNORMAL HIGH (ref 135–145)
Sodium: 153 mmol/L — ABNORMAL HIGH (ref 135–145)
Sodium: 153 mmol/L — ABNORMAL HIGH (ref 135–145)

## 2020-12-23 MED ORDER — OSMOLITE 1.2 CAL PO LIQD
1000.0000 mL | ORAL | Status: DC
  Administered 2020-12-23: 1000 mL

## 2020-12-23 MED ORDER — PROSOURCE TF PO LIQD
45.0000 mL | Freq: Every day | ORAL | Status: DC
  Administered 2020-12-24: 45 mL
  Filled 2020-12-23: qty 45

## 2020-12-23 MED ORDER — SODIUM CHLORIDE 0.9 % IV SOLN
2.0000 g | INTRAVENOUS | Status: DC
  Administered 2020-12-23: 2 g via INTRAVENOUS
  Filled 2020-12-23 (×2): qty 20

## 2020-12-23 MED ORDER — LABETALOL HCL 5 MG/ML IV SOLN
10.0000 mg | INTRAVENOUS | Status: DC | PRN
  Administered 2020-12-23: 10 mg via INTRAVENOUS
  Filled 2020-12-23: qty 4

## 2020-12-23 MED ORDER — POTASSIUM CHLORIDE 20 MEQ PO PACK
40.0000 meq | PACK | Freq: Once | ORAL | Status: AC
  Administered 2020-12-23: 40 meq
  Filled 2020-12-23: qty 2

## 2020-12-23 NOTE — Progress Notes (Signed)
Initial Nutrition Assessment  DOCUMENTATION CODES:   Not applicable  INTERVENTION:   Tube feeding via OG tube: Osmolite 1.2 at 60 ml/h (1440 ml per day) Prosource TF 45 ml daily  Provides 1768 kcal, 90 gm protein, 1167 ml free water daily  D/C Vital High Protein  NUTRITION DIAGNOSIS:   Moderate Malnutrition related to chronic illness as evidenced by moderate fat depletion, moderate muscle depletion, severe muscle depletion.  GOAL:   Patient will meet greater than or equal to 90% of their needs  MONITOR:   TF tolerance  REASON FOR ASSESSMENT:   Consult, Ventilator Enteral/tube feeding initiation and management  ASSESSMENT:   Pt with PMH Of colon cancer and HTN admitted with bilateral embolic infarcts s/p tNK with hemorrhagic conversion with IVH.   Per neurosurgery no plans for surgery/EVD; pt with poor prognosis. Per CCM, possible one-way extubation 9/21.   Per chart review pt with 10 kg weight loss since 2021. Recent weight history unknown. Noted severe muscle depletion of quadriceps muscles. Mobility PTA unknown.   Patient is currently intubated on ventilator support MV: 8.6 L/min Temp (24hrs), Avg:99.6 F (37.6 C), Min:99 F (37.2 C), Max:101.2 F (38.4 C)   Medications reviewed and include: colace, protonix, miralax, KCl x 1, senokot Fentanyl  Cleviprex off  Labs reviewed: Na 152 (therapeutic), K: 3.4 CBGs: 113-138   OG tube: gastric  Current TF:  Vital High Protein @ 40 ml/hr with 45 ml ProSource TF BID Provides: 1080 kcal and 106 grams protein   NUTRITION - FOCUSED PHYSICAL EXAM:  Flowsheet Row Most Recent Value  Orbital Region Moderate depletion  Upper Arm Region No depletion  Thoracic and Lumbar Region Moderate depletion  Buccal Region Unable to assess  Temple Region Moderate depletion  Clavicle Bone Region Mild depletion  Clavicle and Acromion Bone Region Mild depletion  Scapular Bone Region Mild depletion  Dorsal Hand No depletion   Patellar Region Severe depletion  Anterior Thigh Region Severe depletion  Posterior Calf Region Severe depletion  Edema (RD Assessment) None  Hair Reviewed  Eyes Unable to assess  Mouth Unable to assess  Skin Reviewed  Nails Reviewed       Diet Order:   Diet Order             Diet NPO time specified  Diet effective now                   EDUCATION NEEDS:   Not appropriate for education at this time  Skin:  Skin Assessment: Skin Integrity Issues: Skin Integrity Issues:: Stage II Stage II: buttocks  Last BM:  9/19  Height:   Ht Readings from Last 1 Encounters:  12/09/2020 5\' 2"  (1.575 m)    Weight:   Wt Readings from Last 1 Encounters:  12/15/2020 63.8 kg    BMI:  Body mass index is 25.73 kg/m.  Estimated Nutritional Needs:   Kcal:  1700-1900  Protein:  85-100 grams  Fluid:  > 1.7 L/day  Lockie Pares., RD, LDN, CNSC See AMiON for contact information

## 2020-12-23 NOTE — Care Plan (Addendum)
GOALS OF CARE DISCUSSION   The Clinical status was relayed to daughter and her husband at bedside  in detail.   Updated and notified of patients medical condition.     Patient remains unresponsive and will not open eyes to command.   Patient is having a weak cough and struggling to remove secretions.   Explained to family course of therapy and the modalities  Signs of brain damage Recommend follow up [place]   Patient's family is thinking about comfort care, considering patient will have significant disability, but they would like to think over it until tomorrow morning   PATIENT REMAINS DNR     Family are satisfied with Plan of action and management. All questions answered   Additional CC time 20 mins    Jacky Kindle MD [facility] See Amion for pager If no response to pager, please call [phone] until 7pm After 7pm, Please call E-link [phone]

## 2020-12-23 NOTE — Progress Notes (Signed)
Speech Language Pathology Discharge Patient Details Name: [name] MRN: [id number] DOB: 1945-10-29 Today's Date: 12/23/2020 Time:  -     Patient discharged from SLP services secondary to medical decline - will need to re-order SLP to resume therapy services.  Please see latest therapy progress note for current level of functioning and progress toward goals.    Progress and discharge plan discussed with patient and/or caregiver: Patient unable to participate in discharge planning and no caregivers available  GO    Dewitt Rota, SLP-Student  Dewitt Rota 12/23/2020, 8:28 AM

## 2020-12-23 NOTE — Progress Notes (Signed)
Physical Therapy Discharge Patient Details Name: [name] MRN: [id number] DOB: [birth year] Today's Date: 12/23/2020 Time:  -1137     Patient discharged from PT services secondary to medical decline - will need to re-order PT to resume therapy services.   Progress and discharge plan discussed with patient and/or caregiver: Patient unable to participate in discharge planning and no caregivers available  Leighton Roach, [place]  Pager [phone] Office [phone]   GP     [place] 12/23/2020, 11:36 AM

## 2020-12-23 NOTE — Progress Notes (Signed)
PT Cancellation Note  Patient Details Name: [name] MRN: [id number] DOB: 1945/11/24   Cancelled Treatment:    Reason Eval/Treat Not Completed: Active bedrest order;Patient not medically ready. (vent / sedated (claiprex/ fentanyl / propofol) PT order received and appreciated however this conflicts with current bedrest order set. Please increase activity tolerance as appropriate and remove bedrest from orders. . Please contact PT at [phone] if bed rest order is discontinued. PT will hold evaluation at this time and will check back as time allows pending increased activity orders.   Leighton Roach, [place]  Pager [phone] Office [place] 12/23/2020, 8:22 AM

## 2020-12-23 NOTE — Progress Notes (Signed)
Occupational Therapy Discharge Patient Details Name: [name] MRN: [id number] DOB: [birth year] Today's Date: 12/23/2020 Time:  -     Patient discharged from OT services secondary to medical decline - will need to re-order OT to resume therapy services.  Please see latest therapy progress note for current level of functioning and progress toward goals.    Progress and discharge plan discussed with patient and/or caregiver: Patient unable to participate in discharge planning and no caregivers available  GO     [name], OTR/L  Acute Rehabilitation Services Pager: [phone] Office: [phone] .  12/23/2020, 9:41 AM

## 2020-12-23 NOTE — Progress Notes (Signed)
Patient transported to CT and back to 4N17 via the ventilator with no complications.

## 2020-12-23 NOTE — TOC Initial Note (Signed)
Transition of Care [facility]) - Initial/Assessment Note    Patient Details  Name: [name] MRN: [id number] Date of Birth: 07/24/1945  [facility] CM/SW Contact:    Benard Halsted, LCSW Phone Number: 12/23/2020, 9:52 AM  Clinical Narrative:                 CSW continuing to follow for any needs. Patient's Education officer, museum with [employer] is Olivia Mackie [phone]).     Barriers to Discharge: Continued Medical Work up   Patient Goals and CMS Choice        Expected Discharge Plan and Services         Living arrangements for the past 2 months: Single Family Home                                      Prior Living Arrangements/Services Living arrangements for the past 2 months: Single Family Home Lives with:: Spouse Patient language and need for interpreter reviewed:: Yes        Need for Family Participation in Patient Care: Yes (Comment) Care giver support system in place?: Yes (comment)   Criminal Activity/Legal Involvement Pertinent to Current Situation/Hospitalization: No - Comment as needed  Activities of Daily Living      Permission Sought/Granted                  Emotional Assessment   Attitude/Demeanor/Rapport: Unable to Assess Affect (typically observed): Unable to Assess Orientation: :  (intubated) Alcohol / Substance Use: Not Applicable Psych Involvement: No (comment)  Admission diagnosis:  Stroke (cerebrum) ([place]) [I63.9] Left-sided weakness [R53.1] Patient Active Problem List   Diagnosis Date Noted   Pressure injury of skin 12/20/2020   Stroke (cerebrum) ([place]) 12/30/2020   Mild cognitive impairment 09/29/2017   Essential hypertension 02/20/2016   Small bowel obstruction ([place]) 02/13/2016   Seizures ([place]) 05/08/2015   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

## 2020-12-23 NOTE — Progress Notes (Signed)
NAME:  [name], MRN:  [id number], DOB:  [birth year], LOS: 4 ADMISSION DATE:  12/30/2020, CONSULTATION DATE:  12/14/2020 REFERRING MD:  Lynnae Sandhoff MD , CHIEF COMPLAINT:   [place]  History of Present Illness:  75 Y O with H/O colon cancer, DVT, HTN Admitted with acute stroke give lytics in ED, intubated for airway protection. BP on admission was high at 230/120 Then developed ICH with bilateral intraparenchymal hematomas in right thalamus and left lentiform nucleus with intraventricular extension , given cryo and tranexamic acid  Significant Hospital Events: Including procedures, antibiotic start and stop dates in addition to other pertinent events   9/16 admitted for stroke.  Developed intracranial hemorrhage after lytics  Interim History / Subjective:  Patient is off clevidipine Repeat CT scan and MRI brain showed stability of intraparenchymal hemorrhage  Objective   Blood pressure (!) 161/70, pulse 62, temperature 99.1 F (37.3 C), temperature source Axillary, resp. rate 18, height '5\' 2"'  (1.575 m), weight 63.8 kg, SpO2 99 %.    Vent Mode: PRVC FiO2 (%):  [30 %] 30 % Set Rate:  [18 bmp] 18 bmp Vt Set:  [400 mL] 400 mL PEEP:  [5 cmH20] 5 cmH20 Pressure Support:  [13 cmH20] 13 cmH20 Plateau Pressure:  [12 cmH20-17 cmH20] 15 cmH20   Intake/Output Summary (Last 24 hours) at 12/23/2020 0943 Last data filed at 12/23/2020 0700 Gross per 24 hour  Intake 895.19 ml  Output 750 ml  Net 145.19 ml   Filed Weights   12/06/2020 1200  Weight: 63.8 kg    Examination:   Physical exam: General: Crtitically ill-appearing elderly African-American female, orally intubated HEENT: [place]/AT, eyes anicteric. ETT and OGT in place.  Large tongue Neuro: Opens eyes with vocal stimuli, not following commands, pupils 2 mm bilateral reactive to light, leftward gaze deviation, does not cross midline Chest: Coarse breath sounds, no wheezes or rhonchi Heart: Regular rate and rhythm, no murmurs or  gallops Abdomen: Soft, nontender, nondistended, bowel sounds present Skin: No rash  Resolved Hospital Problem list   Hypokalemia Assessment & Plan:  Acute strokelike symptoms s/p tPA Bilateral intraparenchymal hemorrhage post tPA Cerebral edema Induced hypernatremia Underlying Dementia Seizure Disorder Patient remains off 3% saline, serum sodium trended down to 154, with goal Na 145-155.  Monitor serum sodium every 6 hours Off antiplatelet therapy SBP goal SBP < 140, blood pressure is not at the goal Clevidipine is off She is currently on multiple oral antihypertensives including amlodipine 10 mg daily, HCTZ 25 mg once daily, losartan 50 mg twice daily.  Clonidine 0.2 mg 3 times daily  Continue labetalol as needed Continue Keppra  Acute hypoxic respiratory failure due to possible aspiration pneumonia Patient is having very thick secretions through ET tube She spiked fever with T-max 101 Respiratory culture is growing gram-positive rods Started on IV ceftriaxone She is tolerating spontaneous breathing trial, watch for respiratory distress Patient's family is deciding regarding goals of care to see if he can be extubated with intention to not reintubate in case of respiratory distress  Thrombocytopenia This is a chronic issue with normal bone marrow biopsy in 2019.  Exacerbated by critical illness Follow CBC.  Best Practice (right click and "Reselect all SmartList Selections" daily)   Diet/type: NPO tube feeds DVT prophylaxis: not indicated GI prophylaxis: PPI Lines: Central line, Foley:  Yes, and it is still needed Code Status:  DNR Last date of multidisciplinary goals of care discussion: 9/19 spoke with patient's daughter and patient's sister at bedside, updated about  medical condition.  They would like to think it through until tomorrow morning  Labs   CBC: Recent Labs  Lab 12/05/2020 1329 12/07/2020 1601 12/18/2020 2146 12/20/20 0517 12/21/20 0528 12/22/20 0513  WBC  1.9*  --  6.9 5.2 7.2 7.3  NEUTROABS 1.0*  --   --   --   --   --   HGB 12.0 12.9 11.5* 10.5* 10.4* 10.4*  HCT 36.9 38.0 35.1* 32.7* 33.6* 32.9*  MCV 98.9  --  96.7 98.8 102.1* 100.9*  PLT 61*  --  68* 98* 77* 45*    Basic Metabolic Panel: Recent Labs  Lab 12/16/2020 1229 12/29/2020 1329 12/12/2020 1601 12/09/2020 2124 12/20/20 0516 12/20/20 0756 12/20/20 1153 12/21/20 0528 12/21/20 1024 12/21/20 2247 12/22/20 0513 12/22/20 1100 12/22/20 1701 12/22/20 1800 12/23/20 0006  NA 141 138 141   < > 146*  --    < > 158*   < > 156* 154* 154*  --  154* 153*  K 3.7 3.5 3.0*  --  3.3*  --   --  3.4*  --   --  2.9*  --   --   --   --   CL 103 106  --   --  112*  --   --  129*  --   --  121*  --   --   --   --   CO2  --  29  --   --  25  --   --  24  --   --  24  --   --   --   --   GLUCOSE 94 94  --   --  159*  --   --  126*  --   --  118*  --   --   --   --   BUN 5* 6*  --   --  10  --   --  10  --   --  10  --   --   --   --   CREATININE 0.70 0.80  --   --  0.96  --   --  0.70  --   --  0.66  --   --   --   --   CALCIUM  --  8.4*  --   --  8.0*  --   --  8.0*  --   --  8.3*  --   --   --   --   MG  --   --   --   --   --  2.0  --   --   --   --   --   --  2.1  --   --   PHOS  --   --   --   --   --  3.2  --   --   --   --   --   --  2.1*  --   --    < > = values in this interval not displayed.   GFR: Estimated Creatinine Clearance: 51.7 mL/min (by C-G formula based on SCr of 0.66 mg/dL). Recent Labs  Lab 12/29/2020 2146 12/20/20 0517 12/21/20 0528 12/22/20 0513  WBC 6.9 5.2 7.2 7.3    Liver Function Tests: Recent Labs  Lab 12/14/2020 1329 12/20/20 0516  AST 16 18  ALT 8 9  ALKPHOS 48 45  BILITOT 0.4 0.4  PROT 6.0* 5.5*  ALBUMIN 2.9* 2.7*  No results for input(s): LIPASE, AMYLASE in the last 168 hours. No results for input(s): AMMONIA in the last 168 hours.  ABG    Component Value Date/Time   PHART 7.405 12/10/2020 1601   PCO2ART 46.4 12/06/2020 1601   PO2ART 61 (L)  12/15/2020 1601   HCO3 29.1 (H) 12/12/2020 1601   TCO2 30 12/22/2020 1601   O2SAT 91.0 12/23/2020 1601     Coagulation Profile: Recent Labs  Lab 12/14/2020 1329  INR 1.0    Cardiac Enzymes: No results for input(s): CKTOTAL, CKMB, CKMBINDEX, TROPONINI in the last 168 hours.  HbA1C: Hgb A1c MFr Bld  Date/Time Value Ref Range Status  12/21/2020 05:28 AM 4.9 4.8 - 5.6 % Final    Comment:    (NOTE) Pre diabetes:          5.7%-6.4%  Diabetes:              >6.4%  Glycemic control for   <7.0% adults with diabetes     CBG: Recent Labs  Lab 12/22/20 1530 12/22/20 1919 12/22/20 2309 12/23/20 0315 12/23/20 0736  GLUCAP 114* 152* 134* 113* 138*     Total critical care time: 42 minutes  Performed by: [place] care time was exclusive of separately billable procedures and treating other patients.   Critical care was necessary to treat or prevent imminent or life-threatening deterioration.   Critical care was time spent personally by me on the following activities: development of treatment plan with patient and/or surrogate as well as nursing, discussions with consultants, evaluation of patient's response to treatment, examination of patient, obtaining history from patient or surrogate, ordering and performing treatments and interventions, ordering and review of laboratory studies, ordering and review of radiographic studies, pulse oximetry and re-evaluation of patient's condition.   Jacky Kindle MD [place] [facility] See Amion for pager If no response to pager, please call [phone] until 7pm After 7pm, Please call E-link [phone]

## 2020-12-23 NOTE — Progress Notes (Signed)
OT Cancellation Note  Patient Details Name: BECKIE VISCARDI MRN: [id number] DOB: Jul 11, 1945   Cancelled Treatment:    Reason Eval/Treat Not Completed: Active bedrest order (vent / sedated (claiprex/ fentanyl / propofol) OT order received and appreciated however this conflicts with current bedrest order set. Please increase activity tolerance as appropriate and remove bedrest from orders. . Please contact OT at [phone] if bed rest order is discontinued. OT will hold evaluation at this time and will check back as time allows pending increased activity orders.   [name], OTR/L  Acute Rehabilitation Services Pager: [phone] Office: [phone] .  12/23/2020, 7:25 AM

## 2020-12-23 NOTE — Progress Notes (Signed)
STROKE TEAM PROGRESS NOTE   INTERVAL HISTORY Patient sister at bedside.  No significant change overnight, patient still intubated, on ventilation, tolerated pressure support.  However still largely unresponsive, not following commands.  Dr. Tacy Learn CCM discussed with daughter, she is leaning towards to one-way extubation, however, would like to make decision tomorrow.  Vitals:   12/23/20 1130 12/23/20 1200 12/23/20 1208 12/23/20 1230  BP: (!) 132/46 (!) 160/66 (!) 160/66 (!) 149/55  Pulse: 63 73 65 77  Resp: '16 17 17 18  ' Temp:   99.2 F (37.3 C)   TempSrc:   Axillary   SpO2: 99% 98% 99% 97%  Weight:      Height:       CBC:  Recent Labs  Lab 12/17/2020 1329 12/08/2020 1601 12/22/20 0513 12/23/20 0816  WBC 1.9*   < > 7.3 6.3  NEUTROABS 1.0*  --   --   --   HGB 12.0   < > 10.4* 9.9*  HCT 36.9   < > 32.9* 32.0*  MCV 98.9   < > 100.9* 101.9*  PLT 61*   < > 45* 30*   < > = values in this interval not displayed.   Basic Metabolic Panel:  Recent Labs  Lab 12/22/20 0513 12/22/20 1100 12/22/20 1701 12/22/20 1800 12/23/20 0006 12/23/20 0816  NA 154*   < >  --    < > 153* 152*  K 2.9*  --   --   --   --  3.4*  CL 121*  --   --   --   --  122*  CO2 24  --   --   --   --  25  GLUCOSE 118*  --   --   --   --  134*  BUN 10  --   --   --   --  22  CREATININE 0.66  --   --   --   --  0.73  CALCIUM 8.3*  --   --   --   --  8.4*  MG  --   --  2.1  --   --  2.0  PHOS  --   --  2.1*  --   --  3.2   < > = values in this interval not displayed.   Lipid Panel:  Recent Labs  Lab 12/21/20 0528 12/23/20 0816  CHOL 120  --   TRIG 76 69  HDL 57  --   CHOLHDL 2.1  --   VLDL 15  --   LDLCALC 48  --    HgbA1c:  Recent Labs  Lab 12/21/20 0528  HGBA1C 4.9   Urine Drug Screen: No results for input(s): LABOPIA, COCAINSCRNUR, LABBENZ, AMPHETMU, THCU, LABBARB in the last 168 hours.  Alcohol Level No results for input(s): ETH in the last 168 hours.  IMAGING past 24 hours CT HEAD WO  CONTRAST (5MM)  Result Date: 12/23/2020 CLINICAL DATA:  Follow-up stroke EXAM: CT HEAD WITHOUT CONTRAST TECHNIQUE: Contiguous axial images were obtained from the base of the skull through the vertex without intravenous contrast. COMPARISON:  12/20/2020 FINDINGS: Brain: No unexpected or progressive changes. Contraction of the hyperdense components of the intraparenchymal hemorrhages in the right thalamus and left basal ganglia/internal capsule. No evidence of additional bleeding. Regional vasogenic edema appears similar. Intraventricular blood dependent within the occipital horns is similar. No change in ventricular size. No midline shift. Chronic ischemic changes of the pons and cerebral hemispheric white matter  are unchanged. Vascular: There is atherosclerotic calcification of the major vessels at the base of the brain. Skull: Negative Sinuses/Orbits: Sinuses are clear. Mastoid effusions appear similar. Other: None IMPRESSION: No progressive or worsening findings. No evidence of additional bleeding. Expected evolutionary changes with contraction of the hyperdense components of the intraparenchymal hemorrhages in the right thalamus and left basal ganglia/external capsule. Intraventricular blood is stable. No change in ventricular size. Electronically Signed   By: Nelson Chimes M.D.   On: 12/23/2020 08:00    PHYSICAL EXAM  General - Well nourished, well developed, intubated on low dose sedation.   Ophthalmologic - fundi not visualized due to noncooperation.   Cardiovascular - Regular rate and rhythm.   Neuro - intubated on low dose fentanyl, eyes closed but barely open on pain stimulation, not following commands. With forced eye opening, eyes in mild position, not blinking to visual threat, doll's eyes sluggish, not tracking, b/l pupil pinpoint. Corneal reflex present, gag and cough present. Breathing over the vent.  Facial symmetry not able to test due to ET tube.  Tongue protrusion not cooperative. On  pain stimulation,  right arm and leg movement more active than yesterday, RUE 3-/5 and RLE 2/5.  Still has left arm plegia, however, left lower extremity mild withdraw to pain.  DTR diminished and bilateral positive babinski. Sensation, coordination not corporative and gait not tested.   ASSESSMENT/PLAN Ms. CHYANN AMBROCIO is a 75 y.o. female with history of DVT, hypertension, colon cancer status postsurgery and chemotherapy in 2011, seizure on Keppra, MCI admitted for left-sided weakness, facial droop and slurred speech.  BP was high on presentation 230/120.  CT no acute abnormality.  Status post TNK.  CT head and neck showed bilateral A2 stenosis, no LVO.  However, patient developed neuro changes, CT repeat showed left BG/IC and right thalamic ICH with bilateral IVH.  Fibrinogen level 415.  Received TXA and cryo infusion.  Also found to have thrombocytopenia, platelet 61, repeat platelet 68, status post platelet transfusion.   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 CTA head and neck No large vessel occlusion. 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 Repeat CT head 9/16 New acute intracranial hematomas in the right thalamus and left lentiform nucleus with intraventricular extension as above. Mass effect from the right thalamic hemorrhage distorts the third ventricle without evidence of upstream hydrocephalus.  Follow up CT head 9/16 Moderate amount of acute intraventricular blood within the posterior aspects of the lateral ventricles, increased in severity when compared to the prior study. Predominantly stable right thalamic intraparenchymal bleed with persistent mass effect on the third ventricle. Intraparenchymal hematoma centered in  the left lentiform nucleus, increased in size when compared to the prior exam. MRI Unchanged right thalamic and left basal ganglia/internal capsule hemorrhages and small volume intraventricular hemorrhage. Scattered small acute to subacute bilateral cerebral infarcts, largest in the right corona radiata. Repeat CT 9/20 stable hematoma and cerebral edema. 2D Echo EF 70-75% LDL 48 HgbA1c 4.9 VTE prophylaxis - on hold  On ASA 79m daily prior to admission, not on antithrombotics due to ISchofield BarracksTherapy recommendations:  pending Disposition:  TBD, DNR code status, Dr. CTacy Learndiscussed with daughter yesterday, daughter leaning towards to one-way extubation tomorrow.  ICH post tNK Cerebral edema  S/p TXA/cryo and plt transfusion 3% saline infusion -> NS -> off Monitor Na, goal 145-155 Na checks q 6 hours Na 141->146->149->153->158->156->154->152 Neurosurgery consulted: no acute intervention recommended Repeat CT 9/20 stable hematoma and cerebral edema.  Chronic Thrombocytopenia, mild pancytopenia S/p bone marrow biopsy 2019 which was normal S/p plt transfusion Monitor CBC  Plt 61->68->98->77->45->30 (Discussed with Dr. Tacy Learn, continue to monitor for now)  Acute respiratory failure Intubated for airway protection during neurologic decline Appreciate CCM management  On low dose fentanyl Dr. Tacy Learn discussed with daughter who leaning towards to one way extubation tomorrow Palliative care if needed.       Recent history of left CRAO Per note from Ophthalmologist, Dr. Coralyn Pear, patient with left CRAO in June with stroke and GCA work up recommended. Unclear at present if this happened. CTA head and neck no carotid stenosis ESR 25 and CRP 2.9 - no concerning for GCA at this time  Hypertension Severe elevation on admission Coreg d/c'ed due to bradycardia yesterday Off Cleviprex today.  Continue HCTZ, amlodipine and losartan  CCM added clonidine Keep systolic BP < 517 Long-term BP goal  normotensive  Hyperlipidemia Home meds:  None LDL 48,  at goal < 70 High intensity statin on hold at present due to [place] and LDL at goal Consider statin at discharge        History of seizure On Keppra 542m BID at home, continued at 7563mdose  Keppra level 8.6 on admission, low          Feeding/Nutrition On tube feeding        Chronic Pain On Nucynta q 12 hours at baseline         Dementia On home aricept and Namenda      Other Stroke Risk Factors Advanced Age >/= 6519Hx DVT  Other Active Problems Hypokalemia, K 3.4->2.9->3.4, supplement  Hospital day # 4  This patient is critically ill due to embolic stroke, hemorrhagic conversion after TN K, respiratory failure, hypertensive emergency, thrombocytopenia and at significant risk of neurological worsening, death form bleeding from thrombocytopenia, recurrent stroke, brain herniation, hematoma expansion, seizure, encephalopathy. This patient's care requires constant monitoring of vital signs, hemodynamics, respiratory and cardiac monitoring, review of multiple databases, neurological assessment, discussion with family, other specialists and medical decision making of high complexity. I spent 35 minutes of neurocritical care time in the care of this patient. I had long discussion with sister at bedside, updated pt current condition, treatment plan and potential prognosis, and answered all the questions.  She expressed understanding and appreciation.  I also discussed with Dr. ChTacy LearnCM.  JiRosalin HawkingMD PhD Stroke Neurology 12/23/2020 12:53 PM     To contact Stroke Continuity provider, please refer to [email or web address], contact General Neurology

## 2020-12-24 DIAGNOSIS — I63413 Cerebral infarction due to embolism of bilateral middle cerebral arteries: Secondary | ICD-10-CM | POA: Diagnosis not present

## 2020-12-24 DIAGNOSIS — J9601 Acute respiratory failure with hypoxia: Secondary | ICD-10-CM | POA: Diagnosis not present

## 2020-12-24 LAB — BASIC METABOLIC PANEL
Anion gap: 6 (ref 5–15)
BUN: 20 mg/dL (ref 8–23)
CO2: 27 mmol/L (ref 22–32)
Calcium: 8.2 mg/dL — ABNORMAL LOW (ref 8.9–10.3)
Chloride: 118 mmol/L — ABNORMAL HIGH (ref 98–111)
Creatinine, Ser: 0.66 mg/dL (ref 0.44–1.00)
GFR, Estimated: >60 mL/min (ref >60)
Glucose, Bld: 159 mg/dL — ABNORMAL HIGH (ref 70–99)
Potassium: 3.4 mmol/L — ABNORMAL LOW (ref 3.5–5.1)
Sodium: 151 mmol/L — ABNORMAL HIGH (ref 135–145)

## 2020-12-24 LAB — CBC
HCT: 30.7 % — ABNORMAL LOW (ref 36.0–46.0)
Hemoglobin: 9.6 g/dL — ABNORMAL LOW (ref 12.0–15.0)
MCH: 31.7 pg (ref 26.0–34.0)
MCHC: 31.3 g/dL (ref 30.0–36.0)
MCV: 101.3 fL — ABNORMAL HIGH (ref 80.0–100.0)
Platelets: 11 10*3/uL — CL (ref 150–400)
RBC: 3.03 MIL/uL — ABNORMAL LOW (ref 3.87–5.11)
RDW: 13 % (ref 11.5–15.5)
WBC: 5.7 10*3/uL (ref 4.0–10.5)
nRBC: 0 % (ref 0.0–0.2)

## 2020-12-24 LAB — GLUCOSE, CAPILLARY
Glucose-Capillary: 113 mg/dL — ABNORMAL HIGH (ref 70–99)
Glucose-Capillary: 152 mg/dL — ABNORMAL HIGH (ref 70–99)

## 2020-12-24 LAB — MAGNESIUM: Magnesium: 2.2 mg/dL (ref 1.7–2.4)

## 2020-12-24 LAB — PHOSPHORUS: Phosphorus: 3 mg/dL (ref 2.5–4.6)

## 2020-12-24 MED ORDER — LORAZEPAM 1 MG PO TABS: 2.0000 mg | ORAL_TABLET | ORAL | Status: DC | PRN

## 2020-12-24 MED ORDER — POTASSIUM CHLORIDE 20 MEQ PO PACK
40.0000 meq | PACK | Freq: Once | ORAL | Status: AC
  Administered 2020-12-24: 40 meq
  Filled 2020-12-24: qty 2

## 2020-12-24 MED ORDER — HALOPERIDOL LACTATE 5 MG/ML IJ SOLN: 0.5000 mg | INTRAMUSCULAR | Status: DC | PRN

## 2020-12-24 MED ORDER — LORAZEPAM 2 MG/ML PO CONC: 1.0000 mg | ORAL | Status: DC | PRN

## 2020-12-24 MED ORDER — POLYVINYL ALCOHOL 1.4 % OP SOLN
1.0000 [drp] | Freq: Four times a day (QID) | OPHTHALMIC | Status: DC | PRN
  Filled 2020-12-24: qty 15

## 2020-12-24 MED ORDER — HALOPERIDOL LACTATE 2 MG/ML PO CONC
2.0000 mg | ORAL | Status: DC | PRN
  Filled 2020-12-24: qty 1

## 2020-12-24 MED ORDER — ONDANSETRON HCL 4 MG/2ML IJ SOLN: 4.0000 mg | Freq: Four times a day (QID) | INTRAMUSCULAR | Status: DC | PRN

## 2020-12-24 MED ORDER — ONDANSETRON 4 MG PO TBDP: 4.0000 mg | ORAL_TABLET | Freq: Four times a day (QID) | ORAL | Status: DC | PRN

## 2020-12-24 MED ORDER — LORAZEPAM 1 MG PO TABS: 1.0000 mg | ORAL_TABLET | ORAL | Status: DC | PRN

## 2020-12-24 MED ORDER — LORAZEPAM 2 MG/ML IJ SOLN: 2.0000 mg | INTRAMUSCULAR | Status: DC | PRN

## 2020-12-24 MED ORDER — LORAZEPAM 2 MG/ML IJ SOLN: 1.0000 mg | INTRAMUSCULAR | Status: DC | PRN

## 2020-12-24 MED ORDER — LORAZEPAM 2 MG/ML IJ SOLN
INTRAMUSCULAR | Status: AC
  Administered 2020-12-24: 2 mg
  Filled 2020-12-24: qty 1

## 2020-12-24 MED ORDER — HALOPERIDOL 0.5 MG PO TABS
0.5000 mg | ORAL_TABLET | ORAL | Status: DC | PRN
  Filled 2020-12-24: qty 1

## 2020-12-24 MED ORDER — HALOPERIDOL 1 MG PO TABS
2.0000 mg | ORAL_TABLET | ORAL | Status: DC | PRN
  Filled 2020-12-24: qty 2

## 2020-12-24 MED ORDER — LORAZEPAM 2 MG/ML PO CONC: 2.0000 mg | ORAL | Status: DC | PRN

## 2020-12-24 MED ORDER — ACETAMINOPHEN 650 MG RE SUPP: 650.0000 mg | Freq: Four times a day (QID) | RECTAL | Status: DC | PRN

## 2020-12-24 MED ORDER — ACETAMINOPHEN 325 MG PO TABS: 650.0000 mg | ORAL_TABLET | Freq: Four times a day (QID) | ORAL | Status: DC | PRN

## 2020-12-24 MED ORDER — LORAZEPAM 2 MG/ML IJ SOLN
2.0000 mg | Freq: Once | INTRAMUSCULAR | Status: AC
  Administered 2020-12-24: 2 mg via INTRAVENOUS

## 2020-12-24 MED ORDER — GLYCOPYRROLATE 0.2 MG/ML IJ SOLN: 0.2000 mg | INTRAMUSCULAR | Status: DC | PRN

## 2020-12-24 MED ORDER — BIOTENE DRY MOUTH MT LIQD: 15.0000 mL | OROMUCOSAL | Status: DC | PRN

## 2020-12-24 MED ORDER — HALOPERIDOL LACTATE 2 MG/ML PO CONC
0.5000 mg | ORAL | Status: DC | PRN
  Filled 2020-12-24: qty 0.3

## 2020-12-24 MED ORDER — HALOPERIDOL LACTATE 5 MG/ML IJ SOLN: 2.0000 mg | INTRAMUSCULAR | Status: DC | PRN

## 2020-12-24 MED ORDER — GLYCOPYRROLATE 1 MG PO TABS
1.0000 mg | ORAL_TABLET | ORAL | Status: DC | PRN
  Filled 2020-12-24: qty 1

## 2020-12-26 ENCOUNTER — Ambulatory Visit (HOSPITAL_COMMUNITY): Payer: Medicare (Managed Care)

## 2020-12-26 ENCOUNTER — Encounter (HOSPITAL_COMMUNITY): Payer: Self-pay

## 2020-12-27 LAB — CULTURE, RESPIRATORY W GRAM STAIN

## 2021-01-03 NOTE — Progress Notes (Signed)
Scherrie Merritts RN and Montez Hageman RN called cardiac time of death at 13. ECG strip printed and placed in patient's paper chart.

## 2021-01-03 NOTE — Progress Notes (Signed)
[place] was notified of [place] @ 1258. No further interventions needed at this time. Family was at the bedside.

## 2021-01-03 NOTE — Progress Notes (Signed)
Notified Dr. Curly [place] of critical lab. Platelets of 11.

## 2021-01-03 NOTE — Progress Notes (Signed)
ELINK REPLACEMENT PROTOCOL  potassium chloride (KLOR-CON) packet 40 mEq  replaced per eLink replacement protocol guidelines for  K+ 3.4 Guidelines met

## 2021-01-03 NOTE — Progress Notes (Signed)
   NAME:  [name], MRN:  [id number], DOB:  [birth year], LOS: 5 ADMISSION DATE:  12/08/2020, CONSULTATION DATE:  12/06/2020 REFERRING MD:  Lynnae Sandhoff MD , CHIEF COMPLAINT:   [place]  History of Present Illness:  75 Y O with H/O colon cancer, DVT, HTN Admitted with acute stroke give lytics in ED, intubated for airway protection. BP on admission was high at 230/120 Then developed ICH with bilateral intraparenchymal hematomas in right thalamus and left lentiform nucleus with intraventricular extension , given cryo and tranexamic acid  Significant Hospital Events: Including procedures, antibiotic start and stop dates in addition to other pertinent events   77/16 admitted for stroke.  Developed intracranial hemorrhage after lytics  Interim History / Subjective:  No overnight issues Patient remained encephalopathic and unresponsive   Objective   Blood pressure (!) 138/54, pulse (!) 58, temperature 99.5 F (37.5 C), temperature source Axillary, resp. rate 17, height 5\' 2"  (1.575 m), weight 63.2 kg, SpO2 99 %.    Vent Mode: PSV;CPAP FiO2 (%):  [30 %] 30 % Set Rate:  [18 bmp] 18 bmp Vt Set:  [400 mL] 400 mL PEEP:  [5 cmH20] 5 cmH20 Pressure Support:  [8 cmH20-13 cmH20] 8 cmH20 Plateau Pressure:  [11 cmH20-15 cmH20] 15 cmH20   Intake/Output Summary (Last 24 hours) at Jan 18, 2021 0953 Last data filed at 2021-01-18 0900 Gross per 24 hour  Intake 1449.01 ml  Output 1325 ml  Net 124.01 ml   Filed Weights   12/28/2020 1200 18-Jan-2021 0500  Weight: 63.8 kg 63.2 kg    Examination:   Physical exam: General: Crtitically ill-appearing elderly African-American female, orally intubated HEENT: [place]/AT, eyes anicteric. ETT and OGT in place.  Large tongue Neuro: Eyes closed not following commands, pupils 2 mm bilateral reactive to light, leftward gaze deviation, does not cross midline Chest: Coarse breath sounds, no wheezes or rhonchi Heart: Regular rate and rhythm, no murmurs or  gallops Abdomen: Soft, nontender, nondistended, bowel sounds present Skin: No rash  Resolved Hospital Problem list    Assessment & Plan:  Acute strokelike symptoms s/p tPA Bilateral intraparenchymal hemorrhage post tPA Cerebral edema Induced hypernatremia Underlying Dementia Seizure Disorder Acute hypoxic respiratory failure due to possible aspiration pneumonia Thrombocytopenia Hypokalemia  Had detailed discussion with patient's daughter and patient's sister at bedside, they decided to pursue comfort care options considering patient's life quality will not be just seen.  Comfort care orders were written Continue fentanyl Patient family would like [place] to proceed with palliative extubation Extubation orders were placed    Jacky Kindle MD [place] for pager If no response to pager, please call [phone] until 7pm After 7pm, Please call E-link [phone]

## 2021-01-03 NOTE — Procedures (Signed)
Extubation Procedure Note  Patient Details:   Name: [name] DOB: 02-05-1946 MRN: [id number]   Airway Documentation:    Vent end date: January 01, 2021 Vent end time: 1106   Evaluation  O2 sats: stable throughout Complications: No apparent complications Patient did tolerate procedure well. Bilateral Breath Sounds: Clear, Diminished   Pt was terminally extubation per MD order.     Ronaldo Miyamoto 2021/01/01, 11:07 AM

## 2021-01-03 NOTE — Death Summary Note (Signed)
DEATH SUMMARY   Patient Details  Name: [name] MRN: [id number] DOB: [birth year]  Admission/Discharge Information   Admit Date:  18-Jan-2021  Date of Death: Date of Death: 23-Jan-2021  Time of Death: Time of Death: 1256/07/11  Length of Stay: 5  Referring Physician: Javier Docker, MD   Reason(s) for Hospitalization  Stroke with hemorrhagic conversion  Diagnoses  Preliminary cause of death:    Stroke, embolic   Hemorrhagic conversion   Cerebral edema  Secondary Diagnoses (including complications and co-morbidities):  Severe thrombocytopenia Respiratory failure Recent history of left CRAO Hypertension Hyperlipidemia History of seizure Dementia History of DVT Hypokalemia   Brief Hospital Course (including significant findings, care, treatment, and services provided and events leading to death)   [name] is a 75 y.o. female with history of DVT, hypertension, colon cancer status postsurgery and chemotherapy in July 11, 2009, seizure on Keppra, [facility] admitted for left-sided weakness, facial droop and slurred speech.  BP was high on presentation 230/120.  CT no acute abnormality.  Status post TNK.  CT head and neck showed bilateral A2 stenosis, no LVO.  However, patient developed neuro changes, CT repeat showed left BG/IC and right thalamic ICH with bilateral IVH.  Fibrinogen level 415.  Received TXA and cryo infusion.  Also found to have thrombocytopenia, platelet 61, repeat platelet 68, status post platelet transfusion.    Stroke - b/l embolic infarcts s/p tNK with hemorrhagic conversion involving right thalamic and left BG/IC with IVH  Code Stroke CT head 01-19-23 No acute intracranial hemorrhage or infarct. CTA head and neck No large vessel occlusion. 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 Repeat CT head 2023-01-19 New acute intracranial hematomas in the  right thalamus and left lentiform nucleus with intraventricular extension as above. Mass effect from the right thalamic hemorrhage distorts the third ventricle without evidence of upstream hydrocephalus.  Follow up CT head 01-19-2023 Moderate amount of acute intraventricular blood within the posterior aspects of the lateral ventricles, increased in severity when compared to the prior study. Predominantly stable right thalamic intraparenchymal bleed with persistent mass effect on the third ventricle. Intraparenchymal hematoma centered in the left lentiform nucleus, increased in size when compared to the prior exam. MRI Unchanged right thalamic and left basal ganglia/internal capsule hemorrhages and small volume intraventricular hemorrhage. Scattered small acute to subacute bilateral cerebral infarcts, largest in the right corona radiata. Repeat CT 9/20 stable hematoma and cerebral edema. 2D Echo EF 70-75% LDL 48 HgbA1c 4.9 VTE prophylaxis - on hold  On ASA $Remo'81mg'pNvuU$  daily prior to admission, not on antithrombotics due to [place] Disposition: Given extremely poor prognosis, discussed with daughter regarding GOC, daughter requested one-way extubation and comfort care measures.  Patient expired 1258    ICH post tNK Cerebral edema S/p TXA/cryo and plt transfusion 3% saline infusion -> NS -> off Monitor Na, goal 145-155 Na 141->146->149->153->158->156->154->152 Neurosurgery consulted: no acute intervention recommended Repeat CT 9/20 stable hematoma and cerebral edema.   Chronic Thrombocytopenia, mild pancytopenia S/p bone marrow biopsy 2017/07/11 which was normal S/p plt transfusion Plt 61->68->98->77->45->30->11   Acute respiratory failure Intubated for airway protection during neurologic decline Appreciate CCM management  Was on low dose fentanyl Given extremely poor prognosis, discussed with daughter regarding GOC, daughter requested one-way extubation and comfort care measures.  Patient expired July 11, 1256          Recent history of left CRAO Per  note from Ophthalmologist, Dr. Coralyn Pear, patient with left CRAO in June with stroke and GCA work up recommended. Unclear at present if this happened. CTA head and neck no carotid stenosis ESR 25 and CRP 2.9 - no concerning for GCA at this time   Hypertension Severe elevation on admission Coreg d/c'ed due to bradycardia yesterday Off Cleviprex today.  Continue HCTZ, amlodipine and losartan  CCM added clonidine   Hyperlipidemia Home meds:  None LDL 48,  at goal < 70 High intensity statin on hold due to [place] and LDL at goal         History of seizure On Keppra $RemoveB'500mg'yvmauzVe$  BID at home, was continued at $RemoveBefo'750mg'JObTxRCWXMs$  dose  Keppra level 8.6 on admission, low           Feeding/Nutrition Was on tube feeding         Chronic Pain Was on Nucynta q 12 hours at baseline          Dementia Was on home aricept and Namenda      Other Stroke Risk Factors Advanced Age >/= 27  Hx DVT   Other Active Problems Hypokalemia, K 3.4->2.9->3.4     Pertinent Labs and Studies  Significant Diagnostic Studies CT HEAD WO CONTRAST (5MM)  Result Date: 12/23/2020 CLINICAL DATA:  Follow-up stroke EXAM: CT HEAD WITHOUT CONTRAST TECHNIQUE: Contiguous axial images were obtained from the base of the skull through the vertex without intravenous contrast. COMPARISON:  12/20/2020 FINDINGS: Brain: No unexpected or progressive changes. Contraction of the hyperdense components of the intraparenchymal hemorrhages in the right thalamus and left basal ganglia/internal capsule. No evidence of additional bleeding. Regional vasogenic edema appears similar. Intraventricular blood dependent within the occipital horns is similar. No change in ventricular size. No midline shift. Chronic ischemic changes of the pons and cerebral hemispheric white matter are unchanged. Vascular: There is atherosclerotic calcification of the major vessels at the base of the brain. Skull: Negative Sinuses/Orbits: Sinuses are clear.  Mastoid effusions appear similar. Other: None IMPRESSION: No progressive or worsening findings. No evidence of additional bleeding. Expected evolutionary changes with contraction of the hyperdense components of the intraparenchymal hemorrhages in the right thalamus and left basal ganglia/external capsule. Intraventricular blood is stable. No change in ventricular size. Electronically Signed   By: Nelson Chimes M.D.   On: 12/23/2020 08:00   CT HEAD WO CONTRAST (5MM)  Result Date: 12/20/2020 CLINICAL DATA:  Follow-up intracranial hemorrhage EXAM: CT HEAD WITHOUT CONTRAST TECHNIQUE: Contiguous axial images were obtained from the base of the skull through the vertex without intravenous contrast. COMPARISON:  Yesterday FINDINGS: Brain: Right thalamic and left putamen/internal capsule region hematomas are unchanged. The right thalamic hemorrhage measures up to 2 cm in thickness and the left subcortical hemorrhage measures 34 x 25 mm. Intraventricular clot is seen in the occipital horns of the lateral ventricles and layering in the fourth ventricle. No change in the normal ventricular volume. Confluent chronic small vessel ischemia in the hemispheric white matter. No visible cortical infarct. Vascular: No hyperdense vessel or unexpected calcification. Skull: Normal. Negative for fracture or focal lesion. Sinuses/Orbits: Patchy bilateral mastoid and sinus opacification in the setting of intubation. Bilateral cataract resection IMPRESSION: 1. Unchanged parenchymal and intraventricular clot. No hydrocephalus or midline shift. 2. Severe chronic small vessel disease. Electronically Signed   By: Jorje Guild M.D.   On: 12/20/2020 04:14   CT HEAD WO CONTRAST  Result Date: 12/09/2020 CLINICAL DATA:  Follow-up intracranial hemorrhage. EXAM: CT HEAD WITHOUT CONTRAST TECHNIQUE: Contiguous axial  images were obtained from the base of the skull through the vertex without intravenous contrast. COMPARISON:  December 19, 2020  FINDINGS: Brain: There is mild cerebral atrophy with widening of the extra-axial spaces and ventricular dilatation. There are areas of decreased attenuation within the white matter tracts of the supratentorial brain, consistent with microvascular disease changes. A predominantly stable 2.0 cm AP x 2.1 cm TV x 1.7 cm CC acute right thalamic bleed is seen (measured 1.8 cm AP x 2.6 cm TV x 1.8 cm CC on the prior study). Mass effect on the third ventricle is again noted without evidence of subsequent hydrocephalus. An additional 3.4 cm AP x 2.5 cm TV x 3.7 cm CC acute intraparenchymal hematoma is seen centered in the left lentiform nucleus. This is increased in size when compared to the prior study (measured 3.3 cm AP x 2.3 cm transverse by 2.7 cm CC on the prior study). A moderate amount of acute intraventricular blood is seen within the posterior aspects of the lateral ventricles. This is increased in severity when compared to the prior study. A stable amount of acute blood is noted within the third ventricle. Vascular: Bilateral cavernous carotid artery calcification is seen. Skull: Normal. Negative for fracture or focal lesion. Sinuses/Orbits: No acute finding. Other: None. IMPRESSION: 1. Moderate amount of acute intraventricular blood within the posterior aspects of the lateral ventricles, increased in severity when compared to the prior study. 2. Predominantly stable right thalamic intraparenchymal bleed with persistent mass effect on the third ventricle. 3. Intraparenchymal hematoma centered in the left lentiform nucleus, increased in size when compared to the prior exam. Electronically Signed   By: Virgina Norfolk M.D.   On: 12/06/2020 20:35   MR BRAIN WO CONTRAST  Result Date: 12/20/2020 CLINICAL DATA:  Stroke follow-up. EXAM: MRI HEAD WITHOUT CONTRAST TECHNIQUE: Multiplanar, multiecho pulse sequences of the brain and surrounding structures were obtained without intravenous contrast. COMPARISON:  Head CT  12/20/2020 and MRI 06/19/2015 FINDINGS: The study had to be discontinued prior to completion due to the patient's condition. Axial and coronal diffusion, SWI, axial FLAIR, and axial T2 sequences were obtained and are moderately motion degraded. Brain: Parenchymal hemorrhages centered in the right thalamus and left lentiform nucleus/internal capsule region measure 2.5 x 2.1 cm and 4.3 x 2.7 cm, respectively, similar to today's earlier CT. There is mild surrounding edema without midline shift. Layering hemorrhage in the occipital horns of the lateral ventricles as well as a small amount of hemorrhage in the fourth ventricle are also similar to today's CT. There is a small acute infarct in the right corona radiata, and there are punctate acute or subacute infarcts in the right parietal lobe near the posterior aspect of the sylvian fissure, right anterior perforated substance region, left caudate nucleus, and left frontal lobe near the operculum. Patchy and confluent T2 hyperintensities in the cerebral white matter bilaterally have progressed from the 2017 MRI and are nonspecific but compatible with severe chronic small vessel ischemic disease. Chronic lacunar infarcts are noted in the cerebral white matter, deep gray nuclei, and pons. There is mild cerebral and moderate cerebellar atrophy. Multiple chronic microhemorrhages are scattered throughout both cerebral hemispheres as well as in the brainstem and cerebellum. Sharply demarcated FLAIR hyperintensity in the posterior aspects of the right greater than left cerebellar hemispheres is without a corresponding abnormality on standard T2 or diffusion-weighted imaging and may be artifactual. The ventricles are normal in size. Vascular: Major intracranial vascular flow voids are preserved. Skull and upper  cervical spine: No destructive skull lesion. Sinuses/Orbits: Bilateral cataract extraction. Mild mucosal thickening in the paranasal sinuses, greatest in the left  sphenoid sinus. Moderately large bilateral mastoid effusions. Other: None. IMPRESSION: 1. Motion degraded, incomplete examination. 2. Unchanged right thalamic and left basal ganglia/internal capsule hemorrhages and small volume intraventricular hemorrhage. 3. Scattered small acute to subacute bilateral cerebral infarcts, largest in the right corona radiata. 4. Severe chronic small vessel ischemic disease with multiple chronic lacunar infarcts and chronic microhemorrhages as above. Electronically Signed   By: Logan Bores M.D.   On: 12/20/2020 14:39   DG CHEST PORT 1 VIEW  Result Date: 12/06/2020 CLINICAL DATA:  Acute respiratory failure EXAM: PORTABLE CHEST 1 VIEW COMPARISON:  11/12/2020 FINDINGS: Interval intubation, tip of the endotracheal tube is about 3 cm superior to carina. Esophageal tube tip below the diaphragm but incompletely visualized. Left IJ central venous catheter tip over the SVC. Small pleural effusions with airspace disease at left base. Normal cardiac size. There appears to be a small amount of left axillary or upper extremity soft tissue gas. A small venous catheter is noted in the region. IMPRESSION: 1. Support lines and tubes as above. 2. Small bilateral effusions with left basilar atelectasis, pneumonia, or aspiration 3. Suspicion of small amount of gas in the left axillary/upper extremity soft tissues with venous catheter in the region, correlate for catheter function Electronically Signed   By: Donavan Foil M.D.   On: 12/09/2020 18:46   DG Abd Portable 1 View  Result Date: 12/22/2020 CLINICAL DATA:  OG tube placement EXAM: PORTABLE ABDOMEN - 1 VIEW COMPARISON:  KUB 06/12/2017 FINDINGS: Enteric catheter tip and side hole project over the stomach. There is a nonobstructive bowel gas pattern. Patchy opacities at the lung bases likely reflect atelectasis. There is no acute osseous abnormality. IMPRESSION: Enteric catheter tip and side hole project over the stomach. Electronically Signed    By: Valetta Mole M.D.   On: 12/09/2020 16:46   ECHOCARDIOGRAM COMPLETE  Result Date: 12/20/2020    ECHOCARDIOGRAM REPORT   Patient Name:   [name] Date of Exam: 12/20/2020 Medical Rec #:  [id number]          Height:       62.0 in Accession #:    [id number]         Weight:       140.7 lb Date of Birth:  [birth year]          BSA:          1.646 m Patient Age:    83 years           BP:           147/57 mmHg Patient Gender: F                  HR:           80 bpm. Exam Location:  Inpatient Procedure: 2D Echo, Cardiac Doppler and Color Doppler Indications:    Stroke I63.9  History:        Patient has no prior history of Echocardiogram examinations.                 Stroke; Risk Factors:Non-Smoker and Hypertension. Breast Mass.  Sonographer:    Leavy Cella RDCS Referring Phys: [place]  1. Left ventricular ejection fraction, by estimation, is 70 to 75%. The left ventricle has hyperdynamic function. The left ventricle has no regional wall motion abnormalities. Left ventricular diastolic  parameters are consistent with Grade I diastolic dysfunction (impaired relaxation).  2. Right ventricular systolic function is normal. The right ventricular size is normal. Tricuspid regurgitation signal is inadequate for assessing PA pressure.  3. Left atrial size was mildly dilated.  4. The mitral valve is degenerative. No evidence of mitral valve regurgitation. No evidence of mitral stenosis.  5. The aortic valve is tricuspid. Aortic valve regurgitation is not visualized. No aortic stenosis is present. Conclusion(s)/Recommendation(s): No intracardiac source of embolism detected on this transthoracic study. A transesophageal echocardiogram is recommended to exclude cardiac source of embolism if clinically indicated. FINDINGS  Left Ventricle: Left ventricular ejection fraction, by estimation, is 70 to 75%. The left ventricle has hyperdynamic function. The left ventricle has no regional wall  motion abnormalities. The left ventricular internal cavity size was normal in size. There is no left ventricular hypertrophy. Left ventricular diastolic parameters are consistent with Grade I diastolic dysfunction (impaired relaxation). Right Ventricle: The right ventricular size is normal. No increase in right ventricular wall thickness. Right ventricular systolic function is normal. Tricuspid regurgitation signal is inadequate for assessing PA pressure. Left Atrium: Left atrial size was mildly dilated. Right Atrium: Right atrial size was normal in size. Pericardium: Trivial pericardial effusion is present. Mitral Valve: The mitral valve is degenerative in appearance. Mild mitral annular calcification. No evidence of mitral valve regurgitation. No evidence of mitral valve stenosis. Tricuspid Valve: The tricuspid valve is grossly normal. Tricuspid valve regurgitation is trivial. No evidence of tricuspid stenosis. Aortic Valve: The aortic valve is tricuspid. Aortic valve regurgitation is not visualized. Aortic regurgitation PHT measures 277 msec. No aortic stenosis is present. Pulmonic Valve: The pulmonic valve was grossly normal. Pulmonic valve regurgitation is mild. No evidence of pulmonic stenosis. Aorta: The aortic root is normal in size and structure. Venous: IVC assessment for right atrial pressure unable to be performed due to mechanical ventilation. IAS/Shunts: The atrial septum is grossly normal.  LEFT VENTRICLE PLAX 2D LVIDd:         4.07 cm  Diastology LV PW:         1.25 cm  LV e' medial:    8.59 cm/s LV IVS:        1.13 cm  LV E/e' medial:  9.8 LVOT diam:     2.10 cm  LV e' lateral:   8.27 cm/s LVOT Area:     3.46 cm LV E/e' lateral: 10.2  RIGHT VENTRICLE RV S prime:     20.40 cm/s TAPSE (M-mode): 3.3 cm LEFT ATRIUM             Index       RIGHT ATRIUM           Index LA Vol (A2C):   51.8 ml 31.47 ml/m RA Area:     15.50 cm LA Vol (A4C):   64.0 ml 38.88 ml/m RA Volume:   42.00 ml  25.52 ml/m LA  Biplane Vol: 56.8 ml 34.51 ml/m  AORTIC VALVE AI PHT:      277 msec  AORTA Ao Root diam: 2.90 cm MITRAL VALVE MV Area (PHT): 3.50 cm    SHUNTS MV Decel Time: 217 msec    Systemic Diam: 2.10 cm MV E velocity: 84.10 cm/s MV A velocity: 95.10 cm/s MV E/A ratio:  0.88 Eleonore Chiquito MD Electronically signed by Eleonore Chiquito MD Signature Date/Time: 12/20/2020/1:13:12 PM    Final    OCT, Retina - OU - Both Eyes  Result Date: 12/16/2020 Right Eye Quality was  good. Central Foveal Thickness: 263. Progression has been stable. Findings include normal foveal contour, no IRF, no SRF, retinal drusen . Left Eye Quality was good. Central Foveal Thickness: 237. Progression has worsened. Findings include no IRF, no SRF, intraretinal hyper-reflective material, inner retinal atrophy, retinal drusen , abnormal foveal contour, outer retinal atrophy (Interval progression of inner retinal atrophy and inner retinal hyperreflectivity sup nasal macula). Notes *Images captured and stored on drive Diagnosis / Impression: OD: NFP, no IRF/SRF OS: CRAO -- Interval progression of inner retinal atrophy and inner retinal hyperreflectivity sup nasal macula Clinical management: See below Abbreviations: NFP - Normal foveal profile. CME - cystoid macular edema. PED - pigment epithelial detachment. IRF - intraretinal fluid. SRF - subretinal fluid. EZ - ellipsoid zone. ERM - epiretinal membrane. ORA - outer retinal atrophy. ORT - outer retinal tubulation. SRHM - subretinal hyper-reflective material. IRHM - intraretinal hyper-reflective material   CT HEAD CODE STROKE WO CONTRAST  Result Date: 12/11/2020 CLINICAL DATA:  Code stroke. EXAM: CT HEAD WITHOUT CONTRAST TECHNIQUE: Contiguous axial images were obtained from the base of the skull through the vertex without intravenous contrast. COMPARISON:  Same day noncontrast head CT FINDINGS: Brain: There is a new intraparenchymal hematoma in the right thalamus measuring 1.8 cm AP x 2.6 cm TV by 1.8 cm cc.  There is an additional new intraparenchymal hematoma centered in the left lentiform nucleus measuring 3.3 cm AP x 2.3 cm TV by 2.7 cm cc. Mass effect from the right thalamic hemorrhage distorts the third ventricle without evidence of upstream hydrocephalus. There is intraventricular extension of hemorrhage into the lateral, third, and fourth ventricles with blood casting the third ventricle, aqueduct, and fourth ventricle. There is no midline shift. The basal cisterns are patent. Confluent hypodensity throughout the supratentorial white matter is unchanged. Vascular: There is calcification of the bilateral cavernous ICAs. Skull: Normal. Negative for fracture or focal lesion. Sinuses/Orbits: No acute finding. Other: None. IMPRESSION: New acute intracranial hematomas in the right thalamus and left lentiform nucleus with intraventricular extension as above. Mass effect from the right thalamic hemorrhage distorts the third ventricle without evidence of upstream hydrocephalus. Critical Value/emergent results were called by telephone at the time of interpretation on 12/11/2020 at 2:35 pm to provider [facility] , who verbally acknowledged these results. Electronically Signed   By: Valetta Mole M.D.   On: 12/31/2020 14:40   CT HEAD CODE STROKE WO CONTRAST  Result Date: 12/09/2020 CLINICAL DATA:  Code stroke. EXAM: CT HEAD WITHOUT CONTRAST TECHNIQUE: Contiguous axial images were obtained from the base of the skull through the vertex without intravenous contrast. COMPARISON:  Brain MRI 06/19/2015, CT head 05/05/2015 FINDINGS: Brain: There is no evidence of acute intracranial hemorrhage, extra-axial fluid collection, or acute infarct. There is extensive hypodensity throughout the subcortical and periventricular white matter likely reflecting sequela of advanced chronic white matter microangiopathy. There is no mass lesion. There is no midline shift. The ventricles are stable in size. Vascular: There is calcification of  the bilateral cavernous ICAs and vertebral arteries. Skull: Normal. Negative for fracture or focal lesion. Sinuses/Orbits: There is mucosal thickening in the left sphenoid sinus. Bilateral lens implants are in place. The globes and orbits are otherwise unremarkable. Other: There are bilateral mastoid effusions. ASPECTS [facility] Stroke Program Early CT Score) - Ganglionic level infarction (caudate, lentiform nuclei, internal capsule, insula, M1-M3 cortex): 7 - Supraganglionic infarction (M4-M6 cortex): 3 Total score (0-10 with 10 being normal): 10 IMPRESSION: 1. No acute intracranial hemorrhage or infarct. 2. ASPECTS  is 10 3. Advanced chronic white matter microangiopathy. 4. Bilateral mastoid effusions. These results were paged via AMION at the time of interpretation on 12/20/2020 at 12:39 pm to provider Dr Theda Sers. Electronically Signed   By: Valetta Mole M.D.   On: 12/11/2020 12:41   CT ANGIO HEAD CODE STROKE  Result Date: 12/22/2020 CLINICAL DATA:  Neuro deficit, acute, stroke suspected; Stroke/TIA, assess extracranial arteries EXAM: CT ANGIOGRAPHY HEAD AND NECK TECHNIQUE: Multidetector CT imaging of the head and neck was performed using the standard protocol during bolus administration of intravenous contrast. Multiplanar CT image reconstructions and MIPs were obtained to evaluate the vascular anatomy. Carotid stenosis measurements (when applicable) are obtained utilizing NASCET criteria, using the distal internal carotid diameter as the denominator. CONTRAST:  96mL OMNIPAQUE IOHEXOL 350 MG/ML SOLN COMPARISON:  Carotid ultrasound 11/24/2020. FINDINGS: CTA NECK FINDINGS Aortic arch: Great vessel origins are patent. Right carotid system: Mild carotid bifurcation atherosclerosis without greater than 50% stenosis. Left carotid system: Mild carotid bifurcation atherosclerosis without greater than 50% stenosis. Vertebral arteries: Left dominant. The right vertebral artery is small throughout its course. No evidence  of significant (greater than 50%) stenosis. Bilateral vertebral arteries are tortuous. Skeleton: No acute abnormality. Other neck: Acute abnormality. Upper chest: Visualized lung apices are clear. Review of the MIP images confirms the above findings CTA HEAD FINDINGS Anterior circulation: Bilateral intracranial ICAs are patent with mild atherosclerotic narrowing bilateral MCAs are patent without proximal hemodynamically significant stenosis. Early left MCA bifurcation. Hypoplastic or absent right A1 ACA with prominent left A1 ACA, anatomic variant. Suspected severe stenosis of the right A2 ACA (see series 7, image 64) and moderate stenosis of the left A2 ACA. 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 No aneurysm identified. Posterior circulation: Small/non dominant right vertebral artery terminates as PICA, anatomic variant. 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 Basilar artery is patent with mild narrowing. Small vertebrobasilar system with prominent posterior communicating arteries bilaterally and small P1 PCAs, anatomic variant. Bilateral posterior cerebral arteries are patent with up to mild stenosis. Venous sinuses: As permitted by contrast timing, patent. Anatomic variants: As detailed above. Review of the MIP images confirms the above findings IMPRESSION: CTA Head: 1. No large vessel occlusion. 2. 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 3. 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 CTA Neck: No significant (greater than 50%) stenosis. Findings discussed with Dr. Theda Sers via telephone at 1:30 p.m. Electronically Signed   By: Margaretha Sheffield M.D.   On: 12/09/2020 13:39   CT ANGIO NECK CODE STROKE  Result Date: 12/18/2020 CLINICAL DATA:  Neuro deficit, acute, stroke suspected; Stroke/TIA, assess extracranial arteries EXAM: CT ANGIOGRAPHY HEAD AND NECK TECHNIQUE:  Multidetector CT imaging of the head and neck was performed using the standard protocol during bolus administration of intravenous contrast. Multiplanar CT image reconstructions and MIPs were obtained to evaluate the vascular anatomy. Carotid stenosis measurements (when applicable) are obtained utilizing NASCET criteria, using the distal internal carotid diameter as the denominator. CONTRAST:  65mL OMNIPAQUE IOHEXOL 350 MG/ML SOLN COMPARISON:  Carotid ultrasound 11/24/2020. FINDINGS: CTA NECK FINDINGS Aortic arch: Great vessel origins are patent. Right carotid system: Mild carotid bifurcation atherosclerosis without greater than 50% stenosis. Left carotid system: Mild carotid bifurcation atherosclerosis without greater than 50% stenosis. Vertebral arteries: Left dominant. The right vertebral artery is small throughout its course. No evidence of significant (greater than 50%) stenosis. Bilateral vertebral arteries  are tortuous. Skeleton: No acute abnormality. Other neck: Acute abnormality. Upper chest: Visualized lung apices are clear. Review of the MIP images confirms the above findings CTA HEAD FINDINGS Anterior circulation: Bilateral intracranial ICAs are patent with mild atherosclerotic narrowing bilateral MCAs are patent without proximal hemodynamically significant stenosis. Early left MCA bifurcation. Hypoplastic or absent right A1 ACA with prominent left A1 ACA, anatomic variant. Suspected severe stenosis of the right A2 ACA (see series 7, image 64) and moderate stenosis of the left A2 ACA. 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 No aneurysm identified. Posterior circulation: Small/non dominant right vertebral artery terminates as PICA, anatomic variant. 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 Basilar artery is patent with mild narrowing. Small vertebrobasilar system with prominent posterior communicating arteries bilaterally and small P1 PCAs,  anatomic variant. Bilateral posterior cerebral arteries are patent with up to mild stenosis. Venous sinuses: As permitted by contrast timing, patent. Anatomic variants: As detailed above. Review of the MIP images confirms the above findings IMPRESSION: CTA Head: 1. No large vessel occlusion. 2. 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 3. 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 CTA Neck: No significant (greater than 50%) stenosis. Findings discussed with Dr. Thomasena Edis via telephone at 1:30 p.m. Electronically Signed   By: Feliberto Harts M.D.   On: 12/18/2020 13:39    Microbiology Recent Results (from the past 240 hour(s))  Resp Panel by RT-PCR (Flu A&B, Covid) Nasopharyngeal Swab     Status: None   Collection Time: 12/18/2020  3:45 PM   Specimen: Nasopharyngeal Swab; Nasopharyngeal(NP) swabs in vial transport medium  Result Value Ref Range Status   SARS Coronavirus 2 by RT PCR NEGATIVE NEGATIVE Final    Comment: (NOTE) SARS-CoV-2 target nucleic acids are NOT DETECTED.  The SARS-CoV-2 RNA is generally detectable in upper respiratory specimens during the acute phase of infection. The lowest concentration of SARS-CoV-2 viral copies this assay can detect is 138 copies/mL. A negative result does not preclude SARS-Cov-2 infection and should not be used as the sole basis for treatment or other patient management decisions. A negative result may occur with  improper specimen collection/handling, submission of specimen other than nasopharyngeal swab, presence of viral mutation(s) within the areas targeted by this assay, and inadequate number of viral copies(<138 copies/mL). A negative result must be combined with clinical observations, patient history, and epidemiological information. The expected result is Negative.  Fact Sheet for Patients:  BloggerCourse.com  Fact Sheet for Healthcare  Providers:  SeriousBroker.it  This test is no t yet approved or cleared by the [place] FDA and  has been authorized for detection and/or diagnosis of SARS-CoV-2 by FDA under an Emergency Use Authorization (EUA). This EUA will remain  in effect (meaning this test can be used) for the duration of the COVID-19 declaration under Section 564(b)(1) of the Act, 21 U.S.C.section 360bbb-3(b)(1), unless the authorization is terminated  or revoked sooner.       Influenza A by PCR NEGATIVE NEGATIVE Final   Influenza B by PCR NEGATIVE NEGATIVE Final    Comment: (NOTE) The Xpert Xpress SARS-CoV-2/FLU/RSV plus assay is intended as an aid in the diagnosis of influenza from Nasopharyngeal swab specimens and should not be used as a sole basis for treatment. Nasal washings and aspirates are unacceptable for Xpert Xpress SARS-CoV-2/FLU/RSV testing.  Fact Sheet for Patients: BloggerCourse.com  Fact Sheet for Healthcare Providers: SeriousBroker.it  This test is not yet approved or cleared by the [place] and has been authorized for detection and/or diagnosis of SARS-CoV-2 by FDA under an Emergency Use Authorization (EUA). This EUA will remain in effect (meaning this test can be used) for the duration of the COVID-19 declaration under Section 564(b)(1) of the Act, 21 U.S.C. section 360bbb-3(b)(1), unless the authorization is terminated or revoked.  Performed at [facility], [place] [address] [place], [place] [address]   MRSA Next Gen by PCR, Nasal     Status: None   Collection Time: 12/08/2020  8:32 PM   Specimen: Nasal Mucosa; Nasal Swab  Result Value Ref Range Status   MRSA by PCR Next Gen NOT DETECTED NOT DETECTED Final    Comment: (NOTE) The GeneXpert MRSA Assay (FDA approved for NASAL specimens only), is one component of a comprehensive MRSA colonization surveillance program. It is not intended to  diagnose MRSA infection nor to guide or monitor treatment for MRSA infections. Test performance is not FDA approved in patients less than 36 years old. Performed at [facility], [place] [address] [place], [place] [address]   Culture, Respiratory w Gram Stain     Status: None (Preliminary result)   Collection Time: 12/22/20  8:55 AM   Specimen: Tracheal Aspirate; Respiratory  Result Value Ref Range Status   Specimen Description TRACHEAL ASPIRATE  Final   Special Requests NONE  Final   Gram Stain   Final    ABUNDANT WBC PRESENT, PREDOMINANTLY PMN ABUNDANT GRAM POSITIVE RODS    Culture   Final    ABUNDANT GRAM NEGATIVE RODS CULTURE REINCUBATED FOR BETTER GROWTH Performed at [facility], [place] [address] [place], [place] [address]    Report Status PENDING  Incomplete    Lab Basic Metabolic Panel: Recent Labs  Lab 12/20/20 0516 12/20/20 0756 12/20/20 1153 12/21/20 0528 12/21/20 1024 12/22/20 0513 12/22/20 1100 12/22/20 1701 12/22/20 1800 12/23/20 0006 12/23/20 0816 12/23/20 1415 12/23/20 2126 12/31/2020 0459  NA 146*  --    < > 158*   < > 154*   < >  --    < > 153* 152* 153* 147* 151*  K 3.3*  --   --  3.4*  --  2.9*  --   --   --   --  3.4*  --   --  3.4*  CL 112*  --   --  129*  --  121*  --   --   --   --  122*  --   --  118*  CO2 25  --   --  24  --  24  --   --   --   --  25  --   --  27  GLUCOSE 159*  --   --  126*  --  118*  --   --   --   --  134*  --   --  159*  BUN 10  --   --  10  --  10  --   --   --   --  22  --   --  20  CREATININE 0.96  --   --  0.70  --  0.66  --   --   --   --  0.73  --   --  0.66  CALCIUM 8.0*  --   --  8.0*  --  8.3*  --   --   --   --  8.4*  --   --  8.2*  MG  --  2.0  --   --   --   --   --  2.1  --   --  2.0 1.9  --  2.2  PHOS  --  3.2  --   --   --   --   --  2.1*  --   --  3.2 3.1  --  3.0   < > = values in this interval not displayed.   Liver Function Tests: Recent Labs  Lab 12/06/2020 1329 12/20/20 0516  AST 16 18   ALT 8 9  ALKPHOS 48 45  BILITOT 0.4 0.4  PROT 6.0* 5.5*  ALBUMIN 2.9* 2.7*   No results for input(s): LIPASE, AMYLASE in the last 168 hours. No results for input(s): AMMONIA in the last 168 hours. CBC: Recent Labs  Lab 12/16/2020 1329 12/15/2020 1601 12/20/20 0517 12/21/20 0528 12/22/20 0513 12/23/20 0816 01-23-21 0459  WBC 1.9*   < > 5.2 7.2 7.3 6.3 5.7  NEUTROABS 1.0*  --   --   --   --   --   --   HGB 12.0   < > 10.5* 10.4* 10.4* 9.9* 9.6*  HCT 36.9   < > 32.7* 33.6* 32.9* 32.0* 30.7*  MCV 98.9   < > 98.8 102.1* 100.9* 101.9* 101.3*  PLT 61*   < > 98* 77* 45* 30* 11*   < > = values in this interval not displayed.   Cardiac Enzymes: No results for input(s): CKTOTAL, CKMB, CKMBINDEX, TROPONINI in the last 168 hours. Sepsis Labs: Recent Labs  Lab 12/21/20 0528 12/22/20 0513 12/23/20 0816 Jan 23, 2021 0459  WBC 7.2 7.3 6.3 5.7    Procedures/Operations  Intubation and one-way extubation   Rosalin Hawking 01-23-21, 8:45 PM

## 2021-01-03 NOTE — Progress Notes (Signed)
[facility] [facility] was notified. Instructions given to call with [facility]. Ref# [id number].

## 2021-01-03 DEATH — deceased

## 2021-01-19 ENCOUNTER — Other Ambulatory Visit: Payer: Medicare (Managed Care)

## 2021-01-26 ENCOUNTER — Other Ambulatory Visit: Payer: Medicare (Managed Care)

## 2021-03-20 ENCOUNTER — Encounter (INDEPENDENT_AMBULATORY_CARE_PROVIDER_SITE_OTHER): Payer: Medicare (Managed Care) | Admitting: Ophthalmology
